# Patient Record
Sex: Female | Born: 1985
Health system: Southern US, Community
[De-identification: ages and names within clinical notes are randomized; demographics above are authoritative.]

## PROBLEM LIST (undated history)

## (undated) DIAGNOSIS — F319 Bipolar disorder, unspecified: Secondary | ICD-10-CM

## (undated) DIAGNOSIS — T1491XA Suicide attempt, initial encounter: Secondary | ICD-10-CM

## (undated) DIAGNOSIS — F32A Depression, unspecified: Secondary | ICD-10-CM

## (undated) DIAGNOSIS — IMO0001 Reserved for inherently not codable concepts without codable children: Secondary | ICD-10-CM

## (undated) DIAGNOSIS — F329 Major depressive disorder, single episode, unspecified: Secondary | ICD-10-CM

## (undated) DIAGNOSIS — N390 Urinary tract infection, site not specified: Secondary | ICD-10-CM

## (undated) DIAGNOSIS — G4733 Obstructive sleep apnea (adult) (pediatric): Principal | ICD-10-CM

## (undated) HISTORY — DX: Obstructive sleep apnea (adult) (pediatric): G47.33

## (undated) HISTORY — PX: NOSE SURGERY: SHX723

---

## 2011-06-27 ENCOUNTER — Emergency Department (HOSPITAL_COMMUNITY): Payer: Medicare Other

## 2011-06-27 ENCOUNTER — Inpatient Hospital Stay (HOSPITAL_COMMUNITY): Payer: Medicare Other

## 2011-06-27 ENCOUNTER — Encounter (HOSPITAL_COMMUNITY): Admission: EM | Disposition: A | Discharge status: Rehabilitation Facility | Payer: Self-pay | Source: Ambulatory Visit

## 2011-06-27 ENCOUNTER — Emergency Department (HOSPITAL_COMMUNITY): Payer: Medicare Other | Admitting: Anesthesiology

## 2011-06-27 ENCOUNTER — Encounter (HOSPITAL_COMMUNITY): Payer: Self-pay | Admitting: Anesthesiology

## 2011-06-27 ENCOUNTER — Encounter (HOSPITAL_COMMUNITY): Payer: Self-pay | Admitting: Radiology

## 2011-06-27 ENCOUNTER — Inpatient Hospital Stay (HOSPITAL_COMMUNITY)
Admission: EM | Admit: 2011-06-27 | Discharge: 2011-07-19 | DRG: 907 | Disposition: A | Discharge status: Rehabilitation Facility | Payer: Medicare Other | Source: Ambulatory Visit | Attending: General Surgery | Admitting: General Surgery

## 2011-06-27 DIAGNOSIS — S41111A Laceration without foreign body of right upper arm, initial encounter: Secondary | ICD-10-CM | POA: Diagnosis present

## 2011-06-27 DIAGNOSIS — J9600 Acute respiratory failure, unspecified whether with hypoxia or hypercapnia: Secondary | ICD-10-CM

## 2011-06-27 DIAGNOSIS — F329 Major depressive disorder, single episode, unspecified: Secondary | ICD-10-CM | POA: Diagnosis present

## 2011-06-27 DIAGNOSIS — T794XXA Traumatic shock, initial encounter: Secondary | ICD-10-CM

## 2011-06-27 DIAGNOSIS — IMO0002 Reserved for concepts with insufficient information to code with codable children: Principal | ICD-10-CM | POA: Diagnosis present

## 2011-06-27 DIAGNOSIS — S82209A Unspecified fracture of shaft of unspecified tibia, initial encounter for closed fracture: Secondary | ICD-10-CM | POA: Diagnosis present

## 2011-06-27 DIAGNOSIS — K439 Ventral hernia without obstruction or gangrene: Secondary | ICD-10-CM | POA: Diagnosis present

## 2011-06-27 DIAGNOSIS — S61409A Unspecified open wound of unspecified hand, initial encounter: Secondary | ICD-10-CM | POA: Diagnosis present

## 2011-06-27 DIAGNOSIS — S82201B Unspecified fracture of shaft of right tibia, initial encounter for open fracture type I or II: Secondary | ICD-10-CM | POA: Diagnosis present

## 2011-06-27 DIAGNOSIS — J95821 Acute postprocedural respiratory failure: Secondary | ICD-10-CM | POA: Diagnosis present

## 2011-06-27 DIAGNOSIS — F319 Bipolar disorder, unspecified: Secondary | ICD-10-CM | POA: Diagnosis present

## 2011-06-27 DIAGNOSIS — T07XXXA Unspecified multiple injuries, initial encounter: Secondary | ICD-10-CM

## 2011-06-27 DIAGNOSIS — S069X9A Unspecified intracranial injury with loss of consciousness of unspecified duration, initial encounter: Secondary | ICD-10-CM

## 2011-06-27 DIAGNOSIS — S51809A Unspecified open wound of unspecified forearm, initial encounter: Secondary | ICD-10-CM | POA: Diagnosis present

## 2011-06-27 DIAGNOSIS — I498 Other specified cardiac arrhythmias: Secondary | ICD-10-CM | POA: Diagnosis not present

## 2011-06-27 DIAGNOSIS — S88119A Complete traumatic amputation at level between knee and ankle, unspecified lower leg, initial encounter: Secondary | ICD-10-CM

## 2011-06-27 DIAGNOSIS — G547 Phantom limb syndrome without pain: Secondary | ICD-10-CM | POA: Diagnosis not present

## 2011-06-27 DIAGNOSIS — X818XXA Intentional self-harm by jumping or lying in front of other moving object, initial encounter: Secondary | ICD-10-CM | POA: Diagnosis present

## 2011-06-27 DIAGNOSIS — S56909A Unspecified injury of unspecified muscles, fascia and tendons at forearm level, unspecified arm, initial encounter: Secondary | ICD-10-CM | POA: Diagnosis present

## 2011-06-27 DIAGNOSIS — D62 Acute posthemorrhagic anemia: Secondary | ICD-10-CM

## 2011-06-27 DIAGNOSIS — S82409A Unspecified fracture of shaft of unspecified fibula, initial encounter for closed fracture: Secondary | ICD-10-CM

## 2011-06-27 DIAGNOSIS — T1491XA Suicide attempt, initial encounter: Secondary | ICD-10-CM | POA: Diagnosis present

## 2011-06-27 DIAGNOSIS — R578 Other shock: Secondary | ICD-10-CM | POA: Diagnosis present

## 2011-06-27 DIAGNOSIS — R571 Hypovolemic shock: Secondary | ICD-10-CM

## 2011-06-27 DIAGNOSIS — S82202A Unspecified fracture of shaft of left tibia, initial encounter for closed fracture: Secondary | ICD-10-CM | POA: Diagnosis present

## 2011-06-27 DIAGNOSIS — J9819 Other pulmonary collapse: Secondary | ICD-10-CM | POA: Diagnosis not present

## 2011-06-27 DIAGNOSIS — S62339A Displaced fracture of neck of unspecified metacarpal bone, initial encounter for closed fracture: Secondary | ICD-10-CM | POA: Diagnosis present

## 2011-06-27 DIAGNOSIS — S82409B Unspecified fracture of shaft of unspecified fibula, initial encounter for open fracture type I or II: Secondary | ICD-10-CM

## 2011-06-27 DIAGNOSIS — F4310 Post-traumatic stress disorder, unspecified: Secondary | ICD-10-CM | POA: Diagnosis present

## 2011-06-27 DIAGNOSIS — E876 Hypokalemia: Secondary | ICD-10-CM | POA: Diagnosis not present

## 2011-06-27 DIAGNOSIS — S82209B Unspecified fracture of shaft of unspecified tibia, initial encounter for open fracture type I or II: Secondary | ICD-10-CM

## 2011-06-27 HISTORY — PX: AMPUTATION: SHX166

## 2011-06-27 HISTORY — PX: EXTERNAL FIXATION LEG: SHX1549

## 2011-06-27 LAB — POCT I-STAT, CHEM 8
Chloride: 104 meq/L (ref 96–112)
Glucose, Bld: 343 mg/dL — ABNORMAL HIGH (ref 70–99)
HCT: 38 % (ref 36.0–46.0)
Potassium: 3.3 meq/L — ABNORMAL LOW (ref 3.5–5.1)
Sodium: 143 meq/L (ref 135–145)

## 2011-06-27 LAB — POCT I-STAT 3, ART BLOOD GAS (G3+)
Acid-base deficit: 10 mmol/L — ABNORMAL HIGH (ref 0.0–2.0)
Bicarbonate: 16.5 meq/L — ABNORMAL LOW (ref 20.0–24.0)
Bicarbonate: 17.2 meq/L — ABNORMAL LOW (ref 20.0–24.0)
Patient temperature: 99.7
TCO2: 18 mmol/L (ref 0–100)
pH, Arterial: 7.247 — ABNORMAL LOW (ref 7.350–7.400)
pH, Arterial: 7.412 — ABNORMAL HIGH (ref 7.350–7.400)
pO2, Arterial: 137 mmHg — ABNORMAL HIGH (ref 80.0–100.0)

## 2011-06-27 LAB — COMPREHENSIVE METABOLIC PANEL
ALT: 45 U/L — ABNORMAL HIGH (ref 0–35)
ALT: 44 U/L — ABNORMAL HIGH (ref 0–35)
Albumin: 2 g/dL — ABNORMAL LOW (ref 3.5–5.2)
Alkaline Phosphatase: 35 U/L — ABNORMAL LOW (ref 39–117)
Calcium: 8.3 mg/dL — ABNORMAL LOW (ref 8.4–10.5)
Glucose, Bld: 358 mg/dL — ABNORMAL HIGH (ref 70–99)
Potassium: 3.1 mEq/L — ABNORMAL LOW (ref 3.5–5.1)
Sodium: 140 mEq/L (ref 135–145)
Sodium: 142 mEq/L (ref 135–145)
Total Protein: 7 g/dL (ref 6.0–8.3)
Total Protein: 4.1 g/dL — ABNORMAL LOW (ref 6.0–8.3)

## 2011-06-27 LAB — POCT I-STAT 4, (NA,K, GLUC, HGB,HCT)
Glucose, Bld: 276 mg/dL — ABNORMAL HIGH (ref 70–99)
Potassium: 3.5 meq/L (ref 3.5–5.1)

## 2011-06-27 LAB — CBC
HCT: 19.7 % — ABNORMAL LOW (ref 36.0–46.0)
Hemoglobin: 6.9 g/dL — CL (ref 12.0–15.0)
MCHC: 32.4 g/dL (ref 30.0–36.0)
MCHC: 34.9 g/dL (ref 30.0–36.0)
MCV: 82.4 fL (ref 78.0–100.0)
Platelets: 166 10*3/uL (ref 150–400)
RDW: 13.8 % (ref 11.5–15.5)
RDW: 13.9 % (ref 11.5–15.5)
WBC: 8.9 10*3/uL (ref 4.0–10.5)

## 2011-06-27 LAB — GLUCOSE, CAPILLARY
Glucose-Capillary: 137 mg/dL — ABNORMAL HIGH (ref 70–99)
Glucose-Capillary: 93 mg/dL (ref 70–99)
Glucose-Capillary: 79 mg/dL (ref 70–99)
Glucose-Capillary: 88 mg/dL (ref 70–99)

## 2011-06-27 LAB — PREPARE RBC (CROSSMATCH)

## 2011-06-27 LAB — URINALYSIS, ROUTINE W REFLEX MICROSCOPIC
Bilirubin Urine: NEGATIVE
Leukocytes, UA: NEGATIVE
Nitrite: NEGATIVE
Specific Gravity, Urine: 1.033 — ABNORMAL HIGH (ref 1.005–1.030)
pH: 7.5 (ref 5.0–8.0)

## 2011-06-27 LAB — ETHANOL: Alcohol, Ethyl (B): <11 mg/dL (ref 0–11)

## 2011-06-27 LAB — URINE MICROSCOPIC-ADD ON

## 2011-06-27 LAB — POCT PREGNANCY, URINE: Preg Test, Ur: NEGATIVE

## 2011-06-27 SURGERY — AMPUTATION, ABOVE KNEE
Anesthesia: General | Site: Leg Upper | Laterality: Right | Wound class: Contaminated

## 2011-06-27 MED ORDER — SODIUM BICARBONATE 8.4 % IV SOLN
50.0000 meq | Freq: Once | INTRAVENOUS | Status: AC
  Administered 2011-06-27: 50 meq via INTRAVENOUS
  Filled 2011-06-27: qty 50

## 2011-06-27 MED ORDER — MIDAZOLAM HCL 5 MG/5ML IJ SOLN
INTRAMUSCULAR | Status: AC | PRN
  Administered 2011-06-27: 2 mg via INTRAVENOUS

## 2011-06-27 MED ORDER — ACETAMINOPHEN 650 MG RE SUPP
650.0000 mg | Freq: Four times a day (QID) | RECTAL | Status: DC | PRN
  Administered 2011-06-27 – 2011-07-03 (×6): 650 mg via RECTAL
  Filled 2011-06-27 (×6): qty 1

## 2011-06-27 MED ORDER — SODIUM CHLORIDE 0.9 % IV SOLN
2500.0000 ug | INTRAVENOUS | Status: DC | PRN
  Administered 2011-06-27: 250 ug/h via INTRAVENOUS

## 2011-06-27 MED ORDER — MIDAZOLAM HCL 5 MG/ML IJ SOLN
2.0000 mg/h | INTRAMUSCULAR | Status: DC
  Administered 2011-06-27: 2 mg/h via INTRAVENOUS
  Filled 2011-06-27: qty 10

## 2011-06-27 MED ORDER — DEXTROSE 50 % IV SOLN: 25.0000 mL | INTRAVENOUS | Status: DC | PRN

## 2011-06-27 MED ORDER — SODIUM BICARBONATE 8.4 % IV SOLN
INTRAVENOUS | Status: AC
  Filled 2011-06-27: qty 50

## 2011-06-27 MED ORDER — ROCURONIUM BROMIDE 50 MG/5ML IV SOLN
INTRAVENOUS | Status: AC | PRN
  Administered 2011-06-27: 100 mg via INTRAVENOUS

## 2011-06-27 MED ORDER — GENTAMICIN SULFATE 40 MG/ML IJ SOLN
530.0000 mg | INTRAVENOUS | Status: DC
  Administered 2011-06-27 – 2011-07-03 (×7): 530 mg via INTRAVENOUS
  Filled 2011-06-27 (×9): qty 13.25

## 2011-06-27 MED ORDER — HETASTARCH-ELECTROLYTES 6 % IV SOLN: 500.0000 mL | Freq: Once | INTRAVENOUS | Status: AC

## 2011-06-27 MED ORDER — POTASSIUM CHLORIDE 10 MEQ/100ML IV SOLN
10.0000 meq | INTRAVENOUS | Status: AC
Start: 2011-06-27 — End: 2011-06-27
  Administered 2011-06-27 (×3): 10 meq via INTRAVENOUS
  Filled 2011-06-27 (×3): qty 100

## 2011-06-27 MED ORDER — ONDANSETRON HCL 4 MG/2ML IJ SOLN: 4.0000 mg | Freq: Four times a day (QID) | INTRAMUSCULAR | Status: DC | PRN

## 2011-06-27 MED ORDER — ETOMIDATE 2 MG/ML IV SOLN
INTRAVENOUS | Status: AC
  Filled 2011-06-27: qty 20

## 2011-06-27 MED ORDER — CHLORHEXIDINE GLUCONATE 0.12 % MT SOLN
OROMUCOSAL | Status: AC
  Administered 2011-06-27: 15 mL
  Filled 2011-06-27: qty 15

## 2011-06-27 MED ORDER — INSULIN REGULAR BOLUS VIA INFUSION
0.0000 [IU] | Freq: Three times a day (TID) | INTRAVENOUS | Status: DC
  Filled 2011-06-27: qty 10

## 2011-06-27 MED ORDER — PANTOPRAZOLE SODIUM 40 MG IV SOLR
40.0000 mg | Freq: Every day | INTRAVENOUS | Status: DC
  Administered 2011-06-27 – 2011-07-11 (×15): 40 mg via INTRAVENOUS
  Filled 2011-06-27 (×17): qty 40

## 2011-06-27 MED ORDER — ONDANSETRON HCL 4 MG PO TABS: 4.0000 mg | ORAL_TABLET | Freq: Four times a day (QID) | ORAL | Status: DC | PRN

## 2011-06-27 MED ORDER — ETOMIDATE 2 MG/ML IV SOLN
INTRAVENOUS | Status: AC | PRN
  Administered 2011-06-27: 20 mg via INTRAVENOUS

## 2011-06-27 MED ORDER — IOHEXOL 300 MG/ML  SOLN
100.0000 mL | Freq: Once | INTRAMUSCULAR | Status: AC | PRN
  Administered 2011-06-27: 100 mL via INTRAVENOUS

## 2011-06-27 MED ORDER — MIDAZOLAM BOLUS VIA INFUSION
1.0000 mg | INTRAVENOUS | Status: DC | PRN
  Filled 2011-06-27: qty 2

## 2011-06-27 MED ORDER — LIDOCAINE HCL (CARDIAC) 20 MG/ML IV SOLN
INTRAVENOUS | Status: AC
  Filled 2011-06-27: qty 5

## 2011-06-27 MED ORDER — SODIUM CHLORIDE 0.9 % IV SOLN
50.0000 ug/h | INTRAVENOUS | Status: DC
  Administered 2011-06-27: 50 ug/h via INTRAVENOUS
  Filled 2011-06-27: qty 50

## 2011-06-27 MED ORDER — SODIUM CHLORIDE 0.9 % IV BOLUS (SEPSIS)
1000.0000 mL | Freq: Once | INTRAVENOUS | Status: AC
  Administered 2011-06-27: 1000 mL via INTRAVENOUS

## 2011-06-27 MED ORDER — PHENYLEPHRINE HCL 10 MG/ML IJ SOLN
10.0000 mg | INTRAVENOUS | Status: DC | PRN
  Administered 2011-06-27: 40 ug/min via INTRAVENOUS

## 2011-06-27 MED ORDER — SODIUM CHLORIDE 0.9 % IR SOLN
Status: DC | PRN
  Administered 2011-06-27: 3000 mL

## 2011-06-27 MED ORDER — FENTANYL BOLUS VIA INFUSION
50.0000 ug | Freq: Four times a day (QID) | INTRAVENOUS | Status: DC | PRN
  Filled 2011-06-27: qty 100

## 2011-06-27 MED ORDER — SODIUM CHLORIDE 0.9 % IV SOLN
INTRAVENOUS | Status: DC
  Filled 2011-06-27: qty 1

## 2011-06-27 MED ORDER — SODIUM CHLORIDE 0.9 % IV SOLN
50.0000 mg | INTRAVENOUS | Status: DC | PRN
  Administered 2011-06-27: 5 mg/h via INTRAVENOUS

## 2011-06-27 MED ORDER — CEFAZOLIN SODIUM 1-5 GM-% IV SOLN
1.0000 g | Freq: Three times a day (TID) | INTRAVENOUS | Status: DC
  Administered 2011-06-27 – 2011-07-04 (×21): 1 g via INTRAVENOUS
  Filled 2011-06-27 (×24): qty 50

## 2011-06-27 MED ORDER — FENTANYL CITRATE 0.05 MG/ML IJ SOLN
INTRAMUSCULAR | Status: AC
  Filled 2011-06-27: qty 2

## 2011-06-27 MED ORDER — SODIUM CHLORIDE 0.9 % IV SOLN
INTRAVENOUS | Status: DC | PRN
  Administered 2011-06-27 (×2): via INTRAVENOUS

## 2011-06-27 MED ORDER — PHENYLEPHRINE HCL 10 MG/ML IJ SOLN
0.0000 ug/min | INTRAVENOUS | Status: DC
  Filled 2011-06-27 (×2): qty 4

## 2011-06-27 MED ORDER — LACTATED RINGERS IV SOLN
INTRAVENOUS | Status: DC | PRN
  Administered 2011-06-27: 04:00:00 via INTRAVENOUS

## 2011-06-27 MED ORDER — SUCCINYLCHOLINE CHLORIDE 20 MG/ML IJ SOLN
INTRAMUSCULAR | Status: AC
  Filled 2011-06-27: qty 10

## 2011-06-27 MED ORDER — SODIUM CHLORIDE 0.9 % IV SOLN
50.0000 ug/h | INTRAVENOUS | Status: DC
  Administered 2011-06-27: 300 ug/h via INTRAVENOUS
  Administered 2011-06-28 – 2011-07-01 (×2): 200 ug/h via INTRAVENOUS
  Administered 2011-07-01: 100 ug/h via INTRAVENOUS
  Administered 2011-07-02 – 2011-07-03 (×3): 200 ug/h via INTRAVENOUS
  Administered 2011-07-03: 150 ug/h via INTRAVENOUS
  Administered 2011-07-04: 350 ug/h via INTRAVENOUS
  Administered 2011-07-04: 200 ug/h via INTRAVENOUS
  Administered 2011-07-04: 300 ug/h via INTRAVENOUS
  Administered 2011-07-05 – 2011-07-06 (×3): 350 ug/h via INTRAVENOUS
  Administered 2011-07-06: 300 ug/h via INTRAVENOUS
  Administered 2011-07-06 – 2011-07-08 (×3): 250 ug/h via INTRAVENOUS
  Administered 2011-07-08 – 2011-07-10 (×5): 300 ug/h via INTRAVENOUS
  Filled 2011-06-27 (×31): qty 50

## 2011-06-27 MED ORDER — SODIUM CHLORIDE 0.9 % IV SOLN: INTRAVENOUS | Status: DC

## 2011-06-27 MED ORDER — FENTANYL BOLUS VIA INFUSION
50.0000 ug | Freq: Four times a day (QID) | INTRAVENOUS | Status: DC | PRN
  Administered 2011-07-04 (×2): 50 ug via INTRAVENOUS
  Administered 2011-07-07: 100 ug via INTRAVENOUS
  Filled 2011-06-27 (×4): qty 100

## 2011-06-27 MED ORDER — PANTOPRAZOLE SODIUM 40 MG PO TBEC: 40.0000 mg | DELAYED_RELEASE_TABLET | Freq: Every day | ORAL | Status: DC

## 2011-06-27 MED ORDER — INSULIN ASPART 100 UNIT/ML ~~LOC~~ SOLN
0.0000 [IU] | SUBCUTANEOUS | Status: DC
  Administered 2011-06-30: 2 [IU] via SUBCUTANEOUS
  Administered 2011-06-30: 3 [IU] via SUBCUTANEOUS
  Administered 2011-06-30 (×2): 2 [IU] via SUBCUTANEOUS
  Administered 2011-07-01: 3 [IU] via SUBCUTANEOUS
  Administered 2011-07-01: 2 [IU] via SUBCUTANEOUS
  Administered 2011-07-01 (×2): 3 [IU] via SUBCUTANEOUS
  Administered 2011-07-01 (×2): 2 [IU] via SUBCUTANEOUS
  Administered 2011-07-01 – 2011-07-03 (×9): 3 [IU] via SUBCUTANEOUS
  Administered 2011-07-03 – 2011-07-04 (×2): 2 [IU] via SUBCUTANEOUS
  Administered 2011-07-04: 4 [IU] via SUBCUTANEOUS
  Administered 2011-07-04 (×2): 2 [IU] via SUBCUTANEOUS
  Administered 2011-07-04 – 2011-07-16 (×34): 3 [IU] via SUBCUTANEOUS

## 2011-06-27 MED ORDER — MIDAZOLAM HCL 5 MG/ML IJ SOLN
2.0000 mg/h | INTRAMUSCULAR | Status: DC
  Administered 2011-06-27: 5 mg/h via INTRAVENOUS
  Administered 2011-06-28: 3 mg/h via INTRAVENOUS
  Administered 2011-06-30: 1 mg/h via INTRAVENOUS
  Administered 2011-07-01 – 2011-07-02 (×2): 4 mg/h via INTRAVENOUS
  Administered 2011-07-02: 1 mg/h via INTRAVENOUS
  Administered 2011-07-03: 4 mg/h via INTRAVENOUS
  Filled 2011-06-27 (×14): qty 10

## 2011-06-27 MED ORDER — LORAZEPAM 2 MG/ML IJ SOLN
INTRAMUSCULAR | Status: AC
  Administered 2011-06-27: via INTRAMUSCULAR
  Filled 2011-06-27: qty 1

## 2011-06-27 MED ORDER — DEXTROSE-NACL 5-0.45 % IV SOLN: INTRAVENOUS | Status: DC

## 2011-06-27 MED ORDER — SODIUM CHLORIDE 0.9 % IV SOLN
100.0000 [IU] | INTRAVENOUS | Status: DC | PRN
  Administered 2011-06-27: 6.5 [IU]/h via INTRAVENOUS

## 2011-06-27 MED ORDER — MIDAZOLAM HCL 2 MG/2ML IJ SOLN
INTRAMUSCULAR | Status: AC
  Filled 2011-06-27: qty 2

## 2011-06-27 MED ORDER — HETASTARCH-ELECTROLYTES 6 % IV SOLN
500.0000 mL | Freq: Once | INTRAVENOUS | Status: AC
  Administered 2011-06-27: 500 mL via INTRAVENOUS

## 2011-06-27 MED ORDER — CHLORHEXIDINE GLUCONATE 0.12 % MT SOLN
OROMUCOSAL | Status: AC
  Filled 2011-06-27: qty 15

## 2011-06-27 MED ORDER — FENTANYL CITRATE 0.05 MG/ML IJ SOLN
INTRAMUSCULAR | Status: AC | PRN
  Administered 2011-06-27: 100 ug via INTRAVENOUS

## 2011-06-27 MED ORDER — POTASSIUM CHLORIDE IN NACL 20-0.9 MEQ/L-% IV SOLN
INTRAVENOUS | Status: DC
  Administered 2011-06-27: 09:00:00 via INTRAVENOUS
  Administered 2011-06-28: 125 mL/h via INTRAVENOUS
  Filled 2011-06-27 (×6): qty 1000

## 2011-06-27 MED ORDER — ROCURONIUM BROMIDE 50 MG/5ML IV SOLN
INTRAVENOUS | Status: AC
  Filled 2011-06-27: qty 2

## 2011-06-27 MED ORDER — SUCCINYLCHOLINE CHLORIDE 20 MG/ML IJ SOLN
INTRAMUSCULAR | Status: AC | PRN
Start: 2011-06-27 — End: 2011-06-27
  Administered 2011-06-27: 100 mg via INTRAVENOUS

## 2011-06-27 MED ORDER — HETASTARCH-ELECTROLYTES 6 % IV SOLN
INTRAVENOUS | Status: AC
  Administered 2011-06-27: 500 mL
  Filled 2011-06-27: qty 1000

## 2011-06-27 MED ORDER — CEFAZOLIN SODIUM 1-5 GM-% IV SOLN
INTRAVENOUS | Status: DC | PRN
  Administered 2011-06-27: 2 g via INTRAVENOUS

## 2011-06-27 MED ORDER — POTASSIUM CHLORIDE IN NACL 20-0.9 MEQ/L-% IV SOLN: INTRAVENOUS | Status: DC

## 2011-06-27 MED ORDER — MIDAZOLAM BOLUS VIA INFUSION
1.0000 mg | INTRAVENOUS | Status: DC | PRN
  Administered 2011-07-07: 2 mg via INTRAVENOUS
  Filled 2011-06-27 (×3): qty 2

## 2011-06-27 SURGICAL SUPPLY — 82 items
250MM CARBON RODS ×6 IMPLANT
BANDAGE ELASTIC 4 VELCRO ST LF (GAUZE/BANDAGES/DRESSINGS) ×3 IMPLANT
BANDAGE ELASTIC 6 VELCRO ST LF (GAUZE/BANDAGES/DRESSINGS) ×3 IMPLANT
BANDAGE ESMARK 6X9 LF (GAUZE/BANDAGES/DRESSINGS) ×2 IMPLANT
BANDAGE GAUZE ELAST BULKY 4 IN (GAUZE/BANDAGES/DRESSINGS) ×6 IMPLANT
BLADE SURG 10 STRL SS (BLADE) ×3 IMPLANT
BNDG COHESIVE 4X5 TAN STRL (GAUZE/BANDAGES/DRESSINGS) ×3 IMPLANT
BNDG COHESIVE 6X5 TAN STRL LF (GAUZE/BANDAGES/DRESSINGS) ×3 IMPLANT
BNDG ESMARK 6X9 LF (GAUZE/BANDAGES/DRESSINGS) ×3
BRUSH SCRUB DISP (MISCELLANEOUS) ×6 IMPLANT
CLEANER TIP ELECTROSURG 2X2 (MISCELLANEOUS) ×3 IMPLANT
CLOTH BEACON ORANGE TIMEOUT ST (SAFETY) ×3 IMPLANT
COVER MAYO STAND STRL (DRAPES) IMPLANT
COVER SURGICAL LIGHT HANDLE (MISCELLANEOUS) ×6 IMPLANT
CUFF TOURNIQUET SINGLE 18IN (TOURNIQUET CUFF) IMPLANT
CUFF TOURNIQUET SINGLE 24IN (TOURNIQUET CUFF) IMPLANT
CUFF TOURNIQUET SINGLE 34IN LL (TOURNIQUET CUFF) IMPLANT
CUFF TOURNIQUET SINGLE 44IN (TOURNIQUET CUFF) IMPLANT
DRAIN PENROSE 1/2X12 LTX STRL (WOUND CARE) IMPLANT
DRAPE C-ARM 42X72 X-RAY (DRAPES) ×3 IMPLANT
DRAPE C-ARMOR (DRAPES) ×3 IMPLANT
DRAPE EXTREMITY BILATERAL (DRAPE) ×3 IMPLANT
DRAPE EXTREMITY T 121X128X90 (DRAPE) IMPLANT
DRAPE PROXIMA HALF (DRAPES) ×15 IMPLANT
DRAPE U-SHAPE 47X51 STRL (DRAPES) ×3 IMPLANT
DRSG ADAPTIC 3X8 NADH LF (GAUZE/BANDAGES/DRESSINGS) ×3 IMPLANT
DRSG VAC ATS LRG SENSATRAC (GAUZE/BANDAGES/DRESSINGS) ×3 IMPLANT
ELECT REM PT RETURN 9FT ADLT (ELECTROSURGICAL) ×3
ELECTRODE REM PT RTRN 9FT ADLT (ELECTROSURGICAL) ×2 IMPLANT
EVACUATOR 1/8 PVC DRAIN (DRAIN) IMPLANT
GLOVE BIO SURGEON STRL SZ7.5 (GLOVE) ×3 IMPLANT
GLOVE BIO SURGEON STRL SZ8 (GLOVE) ×3 IMPLANT
GLOVE BIOGEL PI IND STRL 7.5 (GLOVE) ×2 IMPLANT
GLOVE BIOGEL PI IND STRL 8 (GLOVE) ×2 IMPLANT
GLOVE BIOGEL PI INDICATOR 7.5 (GLOVE) ×1
GLOVE BIOGEL PI INDICATOR 8 (GLOVE) ×1
GOWN PREVENTION PLUS XLARGE (GOWN DISPOSABLE) ×3 IMPLANT
GOWN STRL NON-REIN LRG LVL3 (GOWN DISPOSABLE) ×6 IMPLANT
HALF PIN 5.0X160 (PIN) ×12 IMPLANT
HANDPIECE INTERPULSE COAX TIP (DISPOSABLE) ×1
KIT BASIN OR (CUSTOM PROCEDURE TRAY) ×3 IMPLANT
KIT ROOM TURNOVER OR (KITS) ×3 IMPLANT
LARGE VAC SPONGE ×3 IMPLANT
MANIFOLD NEPTUNE II (INSTRUMENTS) ×3 IMPLANT
NEEDLE 22X1 1/2 (OR ONLY) (NEEDLE) IMPLANT
NS IRRIG 1000ML POUR BTL (IV SOLUTION) ×3 IMPLANT
PACK GENERAL/GYN (CUSTOM PROCEDURE TRAY) ×3 IMPLANT
PACK ORTHO EXTREMITY (CUSTOM PROCEDURE TRAY) ×3 IMPLANT
PAD ARMBOARD 7.5X6 YLW CONV (MISCELLANEOUS) ×6 IMPLANT
PAD CAST 3X4 CTTN HI CHSV (CAST SUPPLIES) ×6 IMPLANT
PADDING CAST COTTON 3X4 STRL (CAST SUPPLIES) ×3
PADDING CAST COTTON 6X4 STRL (CAST SUPPLIES) ×9 IMPLANT
PIN CLAMP 2BAR 75MM BLUE (PIN) ×6 IMPLANT
SET HNDPC FAN SPRY TIP SCT (DISPOSABLE) ×2 IMPLANT
SPONGE GAUZE 4X4 12PLY (GAUZE/BANDAGES/DRESSINGS) ×3 IMPLANT
SPONGE LAP 18X18 X RAY DECT (DISPOSABLE) ×6 IMPLANT
SPONGE SCRUB IODOPHOR (GAUZE/BANDAGES/DRESSINGS) ×3 IMPLANT
STAPLER VISISTAT 35W (STAPLE) IMPLANT
STOCKINETTE IMPERVIOUS LG (DRAPES) ×3 IMPLANT
STRIP CLOSURE SKIN 1/2X4 (GAUZE/BANDAGES/DRESSINGS) IMPLANT
SUCTION FRAZIER TIP 10 FR DISP (SUCTIONS) IMPLANT
SUT ETHILON 2 0 PSLX (SUTURE) IMPLANT
SUT ETHILON 3 0 PS 1 (SUTURE) IMPLANT
SUT PDS AB 1 CTX 36 (SUTURE) ×6 IMPLANT
SUT PDS AB 2-0 CT1 27 (SUTURE) IMPLANT
SUT SILK 2 0 (SUTURE)
SUT SILK 2 0 SH CR/8 (SUTURE) ×3 IMPLANT
SUT SILK 2-0 18XBRD TIE 12 (SUTURE) IMPLANT
SUT VIC AB 0 CT1 27 (SUTURE) ×2
SUT VIC AB 0 CT1 27XBRD ANBCTR (SUTURE) ×4 IMPLANT
SUT VIC AB 2-0 CT1 27 (SUTURE) ×2
SUT VIC AB 2-0 CT1 TAPERPNT 27 (SUTURE) ×4 IMPLANT
SWAB COLLECTION DEVICE MRSA (MISCELLANEOUS) IMPLANT
SYR CONTROL 10ML LL (SYRINGE) IMPLANT
TOWEL OR 17X24 6PK STRL BLUE (TOWEL DISPOSABLE) ×6 IMPLANT
TOWEL OR 17X26 10 PK STRL BLUE (TOWEL DISPOSABLE) ×6 IMPLANT
TUBE ANAEROBIC SPECIMEN COL (MISCELLANEOUS) IMPLANT
TUBE CONNECTING 12X1/4 (SUCTIONS) ×3 IMPLANT
UNDERPAD 30X30 INCONTINENT (UNDERPADS AND DIAPERS) ×3 IMPLANT
VAC CANISTER ×3 IMPLANT
WATER STERILE IRR 1000ML POUR (IV SOLUTION) ×6 IMPLANT
YANKAUER SUCT BULB TIP NO VENT (SUCTIONS) ×3 IMPLANT

## 2011-06-27 NOTE — Progress Notes (Signed)
Dr. Corliss Skains notified of arrival ABG and low blood pressure.  Orders rec'd to change TV from 500 to 550, rate increased from 14 to 22.  Orders rec'd for 1000cc NS bolus over 1 hour and neosynephrine if needed.  Will continue to monitor.

## 2011-06-27 NOTE — ED Notes (Signed)
Pt successfully intubated, x-ray at bedside for confirmation, respiratory therapist setting up ventilator.
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
## 2011-06-27 NOTE — Consult Note (Signed)
Made aware of patient per Trauma team, however patient is currently intubated and unable to assess.  Will follow up when patient care participate.  Will follow up.  Ashley Jacobs, MSW LCSW [phone]

## 2011-06-27 NOTE — Consult Note (Signed)
ANTIBIOTIC CONSULT NOTE - INITIAL  Pharmacy Consult for Gentamicin Indication: prophylaxis  Not on File  Patient Measurements: Height: 5\' 3"  (160 cm) Weight: 242 lb 8.1 oz (110 kg) IBW/kg (Calculated) : 52.4  Adjusted Body Weight: 75.44kg  Vital Signs: Temp: 99.7 F (37.6 C) (05/28 1128) Temp src: Axillary (05/28 1128) BP: 129/77 mmHg (05/28 1137) Pulse Rate: 126  (05/28 1137) Intake/Output from previous day: 05/27 0701 - 05/28 0700 In: 9055 [I.V.:8705; Blood:350] Out: 190 [Urine:190] Intake/Output from this shift: Total I/O In: 1756.7 [I.V.:526.7; NG/GT:30; IV Piggyback:1200] Out: 360 [Urine:160; Emesis/NG output:200]  Labs:  Basename 06/27/11 0435 06/27/11 0310 06/27/11 0037 06/27/11 0034  WBC 21.0* -- -- 13.4*  HGB 9.9* 10.5* 12.9 --  PLT 166 -- -- 264  LABCREA -- -- -- --  CREATININE 1.24* -- 1.50* 1.32*   Estimated Creatinine Clearance: 82.6 ml/min (by C-G formula based on Cr of 1.24).  Microbiology: Recent Results (from the past 720 hour(s))  MRSA PCR SCREENING     Status: Normal   Collection Time   06/27/11  4:45 AM      Component Value Range Status Comment   MRSA by PCR NEGATIVE  NEGATIVE  Final     Medical History: History reviewed. No pertinent past medical history.  Assessment: 25yo obese female with an open left tib-fib fracture to begin gentamicin for prophylaxis.  Serum creatnine is slightly elevated at 1.24 with CrCl 17ml/min.  Goal of Therapy:  Gentamicin trough <2  Plan:  1) Gentamicin 530mg  IV q24 (~7mg /kg) 2) Obtain 10h gentamicin trough level 3) Use hartford nomogram to adjust dose if needed  Fredrik Rigger 06/27/2011,11:50 AM
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
## 2011-06-27 NOTE — ED Notes (Signed)
Difficulty obtaining manual blood pressure at this time due to pt being hysterical upon arrival, then immediate intubation.  Can only use left arm because of significant damage to right arm.  EMT attempting BP at this time.
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
## 2011-06-27 NOTE — Anesthesia Preprocedure Evaluation (Signed)
Anesthesia Evaluation  Patient identified by MRN, date of birth, ID band Patient unresponsive    Reviewed: Allergy & Precautions, H&P , Patient's Chart, lab work & pertinent test results  Airway       Dental   Pulmonary  breath sounds clear to auscultation        Cardiovascular Rhythm:Regular Rate:Tachycardia     Neuro/Psych    GI/Hepatic   Endo/Other  Morbid obesity  Renal/GU Renal diseasenegative Renal ROS     Musculoskeletal   Abdominal   Peds  Hematology   Anesthesia Other Findings Pt intubated prior to arrival.  Unable to examine. Sedated.  Reproductive/Obstetrics                           Anesthesia Physical Anesthesia Plan  ASA: IV  Anesthesia Plan: General   Post-op Pain Management:    Induction: Intravenous  Airway Management Planned: Oral ETT  Additional Equipment:   Intra-op Plan:   Post-operative Plan: Post-operative intubation/ventilation  Informed Consent: I have reviewed the patients History and Physical, chart, labs and discussed the procedure including the risks, benefits and alternatives for the proposed anesthesia with the patient or authorized representative who has indicated his/her understanding and acceptance.     Plan Discussed with: Anesthesiologist, Surgeon and CRNA  Anesthesia Plan Comments:         Anesthesia Quick Evaluation

## 2011-06-27 NOTE — Progress Notes (Signed)
Patient ID: Tonya [name], female   DOB: Aug 22, 1985, 26 y.o.   MRN: [id number] Second unit of PRBC going in.  Hemodynamics improved.  Mother reportedly en route from [place]. [name], MD, MPH, FACS Pager: [phone]

## 2011-06-27 NOTE — Anesthesia Postprocedure Evaluation (Signed)
  Anesthesia Post-op Note  Patient: Tonya [name]  Procedure(s) Performed: Procedure(s) (LRB): AMPUTATION ABOVE KNEE (Right) EXTERNAL FIXATION LEG (Left)  Patient Location: SICU  Anesthesia Type: General  Level of Consciousness: sedated and Patient remains intubated per anesthesia plan  Airway and Oxygen Therapy: Patient remains intubated per anesthesia plan and Patient placed on Ventilator (see vital sign flow sheet for setting)  Post-op Pain: none  Post-op Assessment: Post-op Vital signs reviewed  Post-op Vital Signs: Reviewed  Complications: No apparent anesthesia complications

## 2011-06-27 NOTE — Progress Notes (Signed)
Chaplain responded to trauma page. Patient intubated. No family present. Chaplain will monitor the patient's progress and follow up as needed.

## 2011-06-27 NOTE — Progress Notes (Signed)
UR completed.   Number for CSW at [place], [place] facility given to psych CSW here to call and get update on psych hx and meds. [phone].

## 2011-06-27 NOTE — Progress Notes (Signed)
Pt's friend able to make contact with pt's mother in [place] . Mother and pt's brother are preparing to come to hospital today
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
## 2011-06-27 NOTE — Progress Notes (Signed)
Vt increased per MD order

## 2011-06-27 NOTE — Procedures (Signed)
Central Venous Catheter Insertion Procedure Note Chandria Arwen Haseley [id number] 08/24/1985  Procedure: Insertion of Central Venous Catheter Indications: Assessment of intravascular volume  Procedure Details Consent: Unable to obtain consent because of mother out of state and cannot reach by phone. Time Out: Verified patient identification, verified procedure, site/side was marked, verified correct patient position, special equipment/implants available, medications/allergies/relevent history reviewed, required imaging and test results available.  Performed  Maximum sterile technique was used including antiseptics, cap, gloves, gown, hand hygiene, mask and sheet. Skin prep: Chlorhexidine; local anesthetic administered A antimicrobial bonded/coated triple lumen catheter was placed in the left subclavian vein using the Seldinger technique.  Evaluation Blood flow good Complications: No apparent complications Patient did tolerate procedure well. Chest X-ray ordered to verify placement.  CXR: pending.  Mateen Franssen E 06/27/2011, 10:34 AM

## 2011-06-27 NOTE — ED Notes (Signed)
Chem 8 results given to Trauma MD by B. Bing Plume, EMT
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
## 2011-06-27 NOTE — ED Notes (Signed)
PT arrived in CT scan

## 2011-06-27 NOTE — Progress Notes (Signed)
Pt's family requested chaplain assistance from waiting room.  Pt's mother is traveling from out of town and will need a place to stay tonight.  I assisted them in finding the [facility] office to see if they could use the [facility].  I offered emotional support to family as they told me about the pt's situation.  I will refer to unit chaplain for follow-up Dellie Catholic  [phone] oncall pager.

## 2011-06-27 NOTE — Transfer of Care (Signed)
Immediate Anesthesia Transfer of Care Note  Patient: Tonya [name]  Procedure(s) Performed: Procedure(s) (LRB): AMPUTATION ABOVE KNEE (Right) EXTERNAL FIXATION LEG (Left)  Patient Location: SICU  Anesthesia Type: General  Level of Consciousness: unresponsive  Airway & Oxygen Therapy: Patient remains intubated per anesthesia plan and Patient placed on Ventilator (see vital sign flow sheet for setting)  Post-op Assessment: Report given to PACU RN  Post vital signs: Reviewed and stable  Complications: No apparent anesthesia complications

## 2011-06-28 ENCOUNTER — Inpatient Hospital Stay (HOSPITAL_COMMUNITY): Payer: Medicare Other

## 2011-06-28 DIAGNOSIS — Z9911 Dependence on respirator [ventilator] status: Secondary | ICD-10-CM

## 2011-06-28 LAB — GLUCOSE, CAPILLARY
Glucose-Capillary: 90 mg/dL (ref 70–99)
Glucose-Capillary: 80 mg/dL (ref 70–99)
Glucose-Capillary: 89 mg/dL (ref 70–99)
Glucose-Capillary: 99 mg/dL (ref 70–99)
Glucose-Capillary: 117 mg/dL — ABNORMAL HIGH (ref 70–99)

## 2011-06-28 LAB — CBC
HCT: 26.8 % — ABNORMAL LOW (ref 36.0–46.0)
Hemoglobin: 9.6 g/dL — ABNORMAL LOW (ref 12.0–15.0)
MCH: 29.7 pg (ref 26.0–34.0)
MCHC: 35.8 g/dL (ref 30.0–36.0)
MCV: 83 fL (ref 78.0–100.0)
RBC: 3.23 MIL/uL — ABNORMAL LOW (ref 3.87–5.11)

## 2011-06-28 LAB — BASIC METABOLIC PANEL
BUN: 15 mg/dL (ref 6–23)
CO2: 18 mEq/L — ABNORMAL LOW (ref 19–32)
Calcium: 6.5 mg/dL — ABNORMAL LOW (ref 8.4–10.5)
Creatinine, Ser: 1.17 mg/dL — ABNORMAL HIGH (ref 0.50–1.10)
GFR calc non Af Amer: 64 mL/min — ABNORMAL LOW (ref >90)
Glucose, Bld: 105 mg/dL — ABNORMAL HIGH (ref 70–99)

## 2011-06-28 LAB — POCT I-STAT 3, ART BLOOD GAS (G3+)
Patient temperature: 99.8
TCO2: 18 mmol/L (ref 0–100)
pCO2 arterial: 33.5 mmHg — ABNORMAL LOW (ref 35.0–45.0)
pH, Arterial: 7.313 — ABNORMAL LOW (ref 7.350–7.400)

## 2011-06-28 LAB — GENTAMICIN LEVEL, TROUGH: Gentamicin Trough: 4.2 ug/mL (ref 0.5–2.0)

## 2011-06-28 LAB — HEMOGLOBIN A1C: Mean Plasma Glucose: 114 mg/dL (ref <117)

## 2011-06-28 MED ORDER — HETASTARCH-ELECTROLYTES 6 % IV SOLN
500.0000 mL | Freq: Once | INTRAVENOUS | Status: AC
  Administered 2011-06-28: 500 mL via INTRAVENOUS
  Filled 2011-06-28: qty 500

## 2011-06-28 MED ORDER — BIOTENE DRY MOUTH MT LIQD
15.0000 mL | Freq: Four times a day (QID) | OROMUCOSAL | Status: DC
  Administered 2011-06-28 – 2011-07-16 (×67): 15 mL via OROMUCOSAL

## 2011-06-28 MED ORDER — FENTANYL CITRATE 0.05 MG/ML IJ SOLN: 50.0000 ug | INTRAMUSCULAR | Status: DC | PRN

## 2011-06-28 MED ORDER — ENOXAPARIN SODIUM 30 MG/0.3ML ~~LOC~~ SOLN
30.0000 mg | Freq: Two times a day (BID) | SUBCUTANEOUS | Status: DC
  Administered 2011-06-28 – 2011-07-19 (×37): 30 mg via SUBCUTANEOUS
  Filled 2011-06-28 (×48): qty 0.3

## 2011-06-28 MED ORDER — HETASTARCH-ELECTROLYTES 6 % IV SOLN
INTRAVENOUS | Status: AC
  Administered 2011-06-28: 500 mL
  Filled 2011-06-28: qty 500

## 2011-06-28 MED ORDER — DARBEPOETIN ALFA-POLYSORBATE 100 MCG/0.5ML IJ SOLN
100.0000 ug | INTRAMUSCULAR | Status: AC
  Administered 2011-06-28 – 2011-07-12 (×3): 100 ug via SUBCUTANEOUS
  Filled 2011-06-28 (×3): qty 0.5

## 2011-06-28 MED ORDER — KCL IN DEXTROSE-NACL 20-5-0.45 MEQ/L-%-% IV SOLN
INTRAVENOUS | Status: DC
  Administered 2011-06-28 – 2011-06-29 (×4): via INTRAVENOUS
  Administered 2011-06-30: 75 mL/h via INTRAVENOUS
  Administered 2011-07-01: 500 mL via INTRAVENOUS
  Administered 2011-07-02: 01:00:00 via INTRAVENOUS
  Administered 2011-07-03: 50 mL/h via INTRAVENOUS
  Administered 2011-07-11 – 2011-07-14 (×3): via INTRAVENOUS
  Administered 2011-07-15: 1000 mL via INTRAVENOUS
  Filled 2011-06-28 (×34): qty 1000

## 2011-06-28 MED ORDER — PIVOT 1.5 CAL PO LIQD
1000.0000 mL | ORAL | Status: DC
  Administered 2011-06-28: 1000 mL
  Filled 2011-06-28 (×2): qty 1000

## 2011-06-28 MED ORDER — ENOXAPARIN SODIUM 40 MG/0.4ML ~~LOC~~ SOLN
40.0000 mg | SUBCUTANEOUS | Status: DC
  Administered 2011-06-28: 40 mg via SUBCUTANEOUS
  Filled 2011-06-28 (×2): qty 0.4

## 2011-06-28 MED ORDER — MIDAZOLAM HCL 2 MG/2ML IJ SOLN: 1.0000 mg | INTRAMUSCULAR | Status: DC | PRN

## 2011-06-28 MED ORDER — CHLORHEXIDINE GLUCONATE 0.12 % MT SOLN
15.0000 mL | Freq: Two times a day (BID) | OROMUCOSAL | Status: DC
  Administered 2011-06-28 – 2011-07-16 (×35): 15 mL via OROMUCOSAL
  Filled 2011-06-28 (×45): qty 15

## 2011-06-28 MED ORDER — PIVOT 1.5 CAL PO LIQD
1000.0000 mL | ORAL | Status: DC
  Filled 2011-06-28: qty 1000

## 2011-06-28 NOTE — Consult Note (Signed)
MEDICATION RELATED CONSULT NOTE - INITIAL   Pharmacy Consult for Aranesp Indication: Anemia  No Known Allergies  Patient Measurements: Height: 5\' 3"  (160 cm) Weight: 259 lb 14.8 oz (117.9 kg) (bed weight ) IBW/kg (Calculated) : 52.4  Adjusted Body Weight: 75.44kg  Vital Signs: Temp: 101.2 F (38.4 C) (05/29 0739) Temp src: Axillary (05/29 0739) BP: 151/71 mmHg (05/29 0807) Pulse Rate: 136  (05/29 0807) Intake/Output from previous day: 05/28 0701 - 05/29 0700 In: 5759.7 [I.V.:3526.7; Blood:670; NG/GT:90; IV Piggyback:1473] Out: 1370 [Urine:870; Emesis/NG output:300; Drains:200] Intake/Output from this shift:    Labs:  Basename 06/28/11 0410 06/27/11 1130 06/27/11 0435 06/27/11 0037 06/27/11 0034  WBC 11.1* 8.9 21.0* -- --  HGB 9.6* 6.9* 9.9* -- --  HCT 26.8* 19.7* 28.4* -- --  PLT 106* 109* 166 -- --  APTT -- -- -- -- --  CREATININE 1.17* -- 1.24* 1.50* --  LABCREA -- -- -- -- --  CREATININE 1.17* -- 1.24* 1.50* --  CREAT24HRUR -- -- -- -- --  MG 1.5 -- -- -- --  PHOS -- -- -- -- --  ALBUMIN -- -- 2.0* -- 3.4*  PROT -- -- 4.1* -- 7.0  ALBUMIN -- -- 2.0* -- 3.4*  AST -- -- 98* -- 68*  ALT -- -- 44* -- 45*  ALKPHOS -- -- 35* -- 59  BILITOT -- -- 0.4 -- 0.2*  BILIDIR -- -- -- -- --  IBILI -- -- -- -- --   Estimated Creatinine Clearance: 91.2 ml/min (by C-G formula based on Cr of 1.17).  Medical History: History reviewed. No pertinent past medical history.  Assessment: 26yo trauma patient to begin aranesp for anemia (acute blood loss and critical illness). She received blood yesterday, however, patient is Jehov[employer]'s [employer] and can no longer receive blood products. She will be going back to the OR tomorrow for more orthopedic procedures and will be at risk for life-threatening anemia peri-operatively. Hgb on admission was 12.9.  Goal of Therapy:  Hgb 11-12  Plan:  1) Aranesp 100 mcg SQ weekly at 1800 for 3 doses with a 4th dose if patient remains within ICU  when 4th dose is scheduled 2) Monitor daily CBC, weekly reticulocyte count 3) In the absence of transfusions, if Hgb increases >1 gm/dL over 2 week period, reduce dose of growth factor ~25%   Fredrik Rigger 06/28/2011,9:04 AM
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
## 2011-06-28 NOTE — Progress Notes (Signed)
CRITICAL VALUE ALERT  Critical value received:  Gent Level 4.2  Date of notification:  06/28/11  Time of notification:  1240  Critical value read back:yes  Nurse who received alert:  Demetria Pore, RN   MD notified (1st page):  Candise Bowens, PhD   Time MD responded:  1255  Orders received to continue Gentamycin regimen, hanging next dose at 1300.

## 2011-06-28 NOTE — Progress Notes (Signed)
Pharmacy: Gentamicin (Day #2)  10 hour Gentamicin level (drawn 5/28 @ 2300) = 4.2. Per the [facility], patient still qualifies for q24h dosing. Can assume true trough is <2 which is the goal.  Plan:  1) Continue Gentamicin 530mg  IV q24 2) Continue to follow renal function, clinical status

## 2011-06-28 NOTE — Progress Notes (Signed)
Follow up - Trauma and Critical Care  Patient Details:    Tonya [name] is an 26 y.o. female.  Lines/tubes : AIRWAYS 7.5 mm (Active)  Secured at (cm) 23 cm 06/27/2011 12:00 AM     Airway 7.5 mm (Active)  Secured at (cm) 23 cm 06/28/2011  7:59 AM  Measured From Lips 06/28/2011  7:59 AM  Secured Location Right 06/28/2011  7:59 AM  Secured By [employer] 06/28/2011  7:59 AM  Tube Holder Repositioned Yes 06/28/2011  7:59 AM     CVC Triple Lumen 06/27/11 Left Subclavian (Active)  Site Assessment Clean;Dry;Intact 06/27/2011  9:00 PM  Dressing Type Transparent 06/27/2011  9:00 PM  Dressing Status Clean;Dry;Intact;Antimicrobial disc in place 06/27/2011  9:00 PM  Line Care Connections checked and tightened 06/27/2011  9:00 PM  Dressing Change Due 06/28/11 06/27/2011  9:00 PM     Arterial Line 06/27/11 Left Radial (Active)  Site Assessment Clean;Dry;Intact 06/27/2011  9:00 PM  Line Status Pulsatile blood flow 06/27/2011  9:00 PM  Art Line Waveform Appropriate 06/27/2011  9:00 PM  Art Line Interventions Zeroed and calibrated;Leveled;Connections checked and tightened;Flushed per protocol 06/27/2011  7:00 AM  Color/Movement/Sensation Capillary refill less than 3 sec 06/27/2011  9:00 PM  Dressing Type Transparent 06/27/2011  9:00 PM  Dressing Status Clean;Dry;Intact 06/27/2011  9:00 PM     Negative Pressure Wound Therapy Leg Right (Active)  Last dressing change 06/27/11 06/27/2011  5:00 AM  Cycle Continuous 06/27/2011  8:10 PM  Target Pressure (mmHg) 125 06/27/2011  8:10 PM  Canister Changed No 06/27/2011  5:00 AM  Dressing Status Intact 06/27/2011  8:10 PM  Drainage Amount Minimal 06/27/2011  8:10 PM  Drainage Description Serosanguineous 06/27/2011  8:10 PM  Output (mL) 50 mL 06/28/2011  6:00 AM     NG/OG Tube Orogastric (Active)  Placement Verification Auscultation 06/27/2011  8:10 PM  Site Assessment Clean;Dry;Intact 06/27/2011  8:10 PM  Status Suction-low intermittent 06/27/2011  8:10 PM    Drainage Appearance Bile 06/27/2011  8:10 PM  Intake (mL) 30 mL 06/27/2011  8:10 PM  Output (mL) 0 mL 06/28/2011  6:00 AM    Microbiology/Sepsis markers: Results for orders placed during the hospital encounter of 06/27/11  MRSA PCR SCREENING     Status: Normal   Collection Time   06/27/11  4:45 AM      Component Value Range Status Comment   MRSA by PCR NEGATIVE  NEGATIVE  Final     Anti-infectives:  Anti-infectives     Start     Dose/Rate Route Frequency Ordered Stop   06/27/11 1400   ceFAZolin (ANCEF) IVPB 1 g/50 mL premix        1 g 100 mL/hr over 30 Minutes Intravenous 3 times per day 06/27/11 1056     06/27/11 1300   gentamicin (GARAMYCIN) 530 mg in dextrose 5 % 100 mL IVPB        530 mg 113.3 mL/hr over 60 Minutes Intravenous Every 24 hours 06/27/11 1214            Best Practice/Protocols:  On Protonix therapy. Continous Sedation NO DVT prophylaxis  Consults: Treatment Team:  Budd Palmer, MD    Events:  Subjective:    Overnight Issues: Patient received blood yesterday for symptomatic anemia.  No other significant issues overnight.  Objective:  Vital signs for last 24 hours: Temp:  [98.3 F (36.8 C)-101.5 F (38.6 C)] 101.2 F (38.4 C) (05/29 0739) Pulse Rate:  [110-136] 136  (  05/29 0807) Resp:  [0-23] 22  (05/29 0807) BP: (88-151)/(50-77) 151/71 mmHg (05/29 0807) SpO2:  [98 %-100 %] 100 % (05/29 0807) Arterial Line BP: (93-134)/(51-69) 134/66 mmHg (05/29 0700) FiO2 (%):  [29.9 %-40.3 %] 30 % (05/29 0807) Weight:  [117.9 kg (259 lb 14.8 oz)] 117.9 kg (259 lb 14.8 oz) (05/29 0300)  Hemodynamic parameters for last 24 hours: CVP:  [7 mmHg-11 mmHg] 8 mmHg  Intake/Output from previous day: 05/28 0701 - 05/29 0700 In: 5759.7 [I.V.:3526.7; Blood:670; NG/GT:90; IV Piggyback:1473] Out: 1370 [Urine:870; Emesis/NG output:300; Drains:200]  Intake/Output this shift:    Vent settings for last 24 hours: Vent Mode:  [-] PSV FiO2 (%):  [29.9 %-40.3 %] 30  % Set Rate:  [18 bmp-22 bmp] 18 bmp Vt Set:  [550 mL] 550 mL PEEP:  [4.7 cmH20-5 cmH20] 5 cmH20 Pressure Support:  [10 cmH20] 10 cmH20 Plateau Pressure:  [17 cmH20-18 cmH20] 17 cmH20  Physical Exam:  General: no respiratory distress and Starting to wean from ventilator, but weill not extubated because planning on going to surgery tomorrow. Neuro: RASS -3 or deeper HEENT/Neck: no JVD, ETT WNL , PERRL and forehead abrasions are okay. Resp: clear to auscultation bilaterally CVS: Sinus tachycardia GI: soft, nontender, BS WNL, no r/g and hypoactive BS Extremities: edema 3+, amputation present and good palpable pulses on the left foot. Abrasions are all over her left side and her back.  [facility] from [facility]..  Results for orders placed during the hospital encounter of 06/27/11 (from the past 24 hour(s))  GLUCOSE, CAPILLARY     Status: Normal   Collection Time   06/27/11  9:07 AM      Component Value Range   Glucose-Capillary 94  70 - 99 (mg/dL)  CBC     Status: Abnormal   Collection Time   06/27/11 11:30 AM      Component Value Range   WBC 8.9  4.0 - 10.5 (K/uL)   RBC 2.39 (*) 3.87 - 5.11 (MIL/uL)   Hemoglobin 6.9 (*) 12.0 - 15.0 (g/dL)   HCT 16.1 (*) 09.6 - 46.0 (%)   MCV 82.4  78.0 - 100.0 (fL)   MCH 28.9  26.0 - 34.0 (pg)   MCHC 35.0  30.0 - 36.0 (g/dL)   RDW 04.5  40.9 - 81.1 (%)   Platelets 109 (*) 150 - 400 (K/uL)  POCT I-STAT 3, BLOOD GAS (G3+)     Status: Abnormal   Collection Time   06/27/11 11:38 AM      Component Value Range   pH, Arterial 7.412 (*) 7.350 - 7.400    pCO2 arterial 27.1 (*) 35.0 - 45.0 (mmHg)   pO2, Arterial 200.0 (*) 80.0 - 100.0 (mmHg)   Bicarbonate 17.2 (*) 20.0 - 24.0 (mEq/L)   TCO2 18  0 - 100 (mmol/L)   O2 Saturation 100.0     Acid-base deficit 7.0 (*) 0.0 - 2.0 (mmol/L)   Patient temperature 99.7 F     Collection site ARTERIAL LINE     Drawn by Operator     Sample type ARTERIAL    GLUCOSE, CAPILLARY     Status: Normal   Collection  Time   06/27/11 11:38 AM      Component Value Range   Glucose-Capillary 97  70 - 99 (mg/dL)  GLUCOSE, CAPILLARY     Status: Normal   Collection Time   06/27/11  3:19 PM      Component Value Range   Glucose-Capillary 88  70 -  99 (mg/dL)  GLUCOSE, CAPILLARY     Status: Normal   Collection Time   06/27/11  7:41 PM      Component Value Range   Glucose-Capillary 88  70 - 99 (mg/dL)  GLUCOSE, CAPILLARY     Status: Normal   Collection Time   06/27/11 11:49 PM      Component Value Range   Glucose-Capillary 96  70 - 99 (mg/dL)  CBC     Status: Abnormal   Collection Time   06/28/11  4:10 AM      Component Value Range   WBC 11.1 (*) 4.0 - 10.5 (K/uL)   RBC 3.23 (*) 3.87 - 5.11 (MIL/uL)   Hemoglobin 9.6 (*) 12.0 - 15.0 (g/dL)   HCT 54.0 (*) 98.1 - 46.0 (%)   MCV 83.0  78.0 - 100.0 (fL)   MCH 29.7  26.0 - 34.0 (pg)   MCHC 35.8  30.0 - 36.0 (g/dL)   RDW 19.1  47.8 - 29.5 (%)   Platelets 106 (*) 150 - 400 (K/uL)  BASIC METABOLIC PANEL     Status: Abnormal   Collection Time   06/28/11  4:10 AM      Component Value Range   Sodium 146 (*) 135 - 145 (mEq/L)   Potassium 4.2  3.5 - 5.1 (mEq/L)   Chloride 122 (*) 96 - 112 (mEq/L)   CO2 18 (*) 19 - 32 (mEq/L)   Glucose, Bld 105 (*) 70 - 99 (mg/dL)   BUN 15  6 - 23 (mg/dL)   Creatinine, Ser 6.21 (*) 0.50 - 1.10 (mg/dL)   Calcium 6.5 (*) 8.4 - 10.5 (mg/dL)   GFR calc non Af Amer 64 (*) >90 (mL/min)   GFR calc Af Amer 74 (*) >90 (mL/min)  MAGNESIUM     Status: Normal   Collection Time   06/28/11  4:10 AM      Component Value Range   Magnesium 1.5  1.5 - 2.5 (mg/dL)  LACTIC ACID, PLASMA     Status: Normal   Collection Time   06/28/11  4:10 AM      Component Value Range   Lactic Acid, Venous 1.0  0.5 - 2.2 (mmol/L)  POCT I-STAT 3, BLOOD GAS (G3+)     Status: Abnormal   Collection Time   06/28/11  4:16 AM      Component Value Range   pH, Arterial 7.313 (*) 7.350 - 7.400    pCO2 arterial 33.5 (*) 35.0 - 45.0 (mmHg)   pO2, Arterial 91.0  80.0 -  100.0 (mmHg)   Bicarbonate 16.9 (*) 20.0 - 24.0 (mEq/L)   TCO2 18  0 - 100 (mmol/L)   O2 Saturation 96.0     Acid-base deficit 8.0 (*) 0.0 - 2.0 (mmol/L)   Patient temperature 99.8 F     Sample type ARTERIAL       Assessment/Plan:   NEURO  Altered Mental Status:  change in mental status, sedation, schizophrenia and apparent history of bipolar disease.   Plan: Wean sedation as tolerated.  PULM  No current pulmonary issues, tolerating weaning well.  CXR shows no focal infiltrates or injuries.   Plan: Wean and exercise, but do not extubate  CARDIO  Sinus Tachycardia   Plan: No specific treatment.  RENAL  Oliguria (probably hypovolemia)   Plan: give more volume.  Patient is also acidotic, under resuscitated still in spite of volume given yesterday.  GI  No known abnormalities, will give tube feedings.   Plan: Tube  feedings until midnight  ID  No specific infectious issues   Plan: On Kefzol for orthopedic problems  HEME  Anemia acute blood loss anemia and anemia of critical illness) Leukocytosis (neutrophilia)   Plan: Patient recently told to be JW religion.  No further blood products  ENDO Hypernatremia (moderate (146 - 155 meq/dl))   Plan: Change IVFs  Global Issues  Patient has multiple problems, but most significant are her orthopedic injuries.  Being a Administrator.  Will order Aranesp, needs adequate DVT prophylaxis.  Consider IVC filter.    LOS: 1 day   Additional comments:I reviewed the patient's new clinical lab test results. cbc/bmet and I reviewed the patients new imaging test results. CXR  Critical Care Total Time*: 45 Minutes including conference time discussing condition with the patient's mother.  Audra Bellard III,Duayne Brideau O 06/28/2011  *Care during the described time interval was provided by me and/or other providers on the critical care team.  I have reviewed this patient's available data, including medical history, events of note, physical  examination and test results as part of my evaluation.

## 2011-06-29 ENCOUNTER — Inpatient Hospital Stay (HOSPITAL_COMMUNITY): Payer: Medicare Other

## 2011-06-29 ENCOUNTER — Encounter (HOSPITAL_COMMUNITY): Admission: EM | Disposition: A | Discharge status: Rehabilitation Facility | Payer: Self-pay | Source: Ambulatory Visit

## 2011-06-29 ENCOUNTER — Inpatient Hospital Stay (HOSPITAL_COMMUNITY): Payer: Medicare Other | Admitting: Anesthesiology

## 2011-06-29 ENCOUNTER — Encounter (HOSPITAL_COMMUNITY): Payer: Self-pay | Admitting: Anesthesiology

## 2011-06-29 HISTORY — PX: AMPUTATION: SHX166

## 2011-06-29 HISTORY — PX: I & D EXTREMITY: SHX5045

## 2011-06-29 LAB — BASIC METABOLIC PANEL
BUN: 9 mg/dL (ref 6–23)
CO2: 20 mEq/L (ref 19–32)
Calcium: 6.7 mg/dL — ABNORMAL LOW (ref 8.4–10.5)
Chloride: 115 mEq/L — ABNORMAL HIGH (ref 96–112)
Creatinine, Ser: 0.98 mg/dL (ref 0.50–1.10)

## 2011-06-29 LAB — GLUCOSE, CAPILLARY
Glucose-Capillary: 101 mg/dL — ABNORMAL HIGH (ref 70–99)
Glucose-Capillary: 106 mg/dL — ABNORMAL HIGH (ref 70–99)
Glucose-Capillary: 100 mg/dL — ABNORMAL HIGH (ref 70–99)

## 2011-06-29 LAB — CBC
HCT: 21.7 % — ABNORMAL LOW (ref 36.0–46.0)
Hemoglobin: 7.6 g/dL — ABNORMAL LOW (ref 12.0–15.0)
MCHC: 35 g/dL (ref 30.0–36.0)
MCV: 84.8 fL (ref 78.0–100.0)

## 2011-06-29 LAB — DIFFERENTIAL
Basophils Relative: 0 % (ref 0–1)
Eosinophils Relative: 3 % (ref 0–5)
Lymphocytes Relative: 23 % (ref 12–46)
Monocytes Absolute: 0.8 10*3/uL (ref 0.1–1.0)
Monocytes Relative: 9 % (ref 3–12)
Neutro Abs: 6 10*3/uL (ref 1.7–7.7)

## 2011-06-29 SURGERY — AMPUTATION, ABOVE KNEE
Anesthesia: General | Site: Leg Upper | Laterality: Right | Wound class: Contaminated

## 2011-06-29 MED ORDER — BUPIVACAINE-EPINEPHRINE 0.25% -1:200000 IJ SOLN
INTRAMUSCULAR | Status: DC | PRN
  Administered 2011-06-29: 30 mL

## 2011-06-29 MED ORDER — ALBUMIN HUMAN 5 % IV SOLN: INTRAVENOUS | Status: DC | PRN

## 2011-06-29 MED ORDER — SODIUM CHLORIDE 0.9 % IR SOLN
Status: DC | PRN
  Administered 2011-06-29 (×2): 3000 mL

## 2011-06-29 MED ORDER — PHENYLEPHRINE HCL 10 MG/ML IJ SOLN
INTRAMUSCULAR | Status: DC | PRN
  Administered 2011-06-29 (×6): 80 ug via INTRAVENOUS
  Administered 2011-06-29: 120 ug via INTRAVENOUS
  Administered 2011-06-29 (×2): 80 ug via INTRAVENOUS

## 2011-06-29 MED ORDER — LACTATED RINGERS IV SOLN
INTRAVENOUS | Status: DC | PRN
  Administered 2011-06-29 (×2): via INTRAVENOUS

## 2011-06-29 MED ORDER — SELENIUM 50 MCG PO TABS
200.0000 ug | ORAL_TABLET | Freq: Every day | ORAL | Status: AC
  Administered 2011-06-29 – 2011-07-05 (×7): 200 ug
  Filled 2011-06-29 (×7): qty 4

## 2011-06-29 MED ORDER — METOCLOPRAMIDE HCL 5 MG/ML IJ SOLN
10.0000 mg | Freq: Four times a day (QID) | INTRAMUSCULAR | Status: DC
  Administered 2011-06-29 – 2011-06-30 (×3): 10 mg via INTRAVENOUS
  Filled 2011-06-29 (×7): qty 2

## 2011-06-29 MED ORDER — VITAMIN E 15 UNIT/0.3ML PO SOLN
400.0000 [IU] | Freq: Three times a day (TID) | ORAL | Status: AC
  Administered 2011-06-29 – 2011-07-01 (×8): 400 [IU]
  Filled 2011-06-29 (×9): qty 8

## 2011-06-29 MED ORDER — FENTANYL CITRATE 0.05 MG/ML IJ SOLN
INTRAMUSCULAR | Status: DC | PRN
  Administered 2011-06-29: 50 ug via INTRAVENOUS
  Administered 2011-06-29: 100 ug via INTRAVENOUS

## 2011-06-29 MED ORDER — FENTANYL CITRATE 0.05 MG/ML IJ SOLN: 25.0000 ug | INTRAMUSCULAR | Status: DC | PRN

## 2011-06-29 MED ORDER — MIDAZOLAM HCL 5 MG/5ML IJ SOLN
INTRAMUSCULAR | Status: DC | PRN
  Administered 2011-06-29: 2 mg via INTRAVENOUS

## 2011-06-29 MED ORDER — VITAMIN C 500 MG PO TABS
1000.0000 mg | ORAL_TABLET | Freq: Three times a day (TID) | ORAL | Status: AC
  Administered 2011-06-29 – 2011-07-06 (×21): 1000 mg
  Filled 2011-06-29 (×22): qty 2

## 2011-06-29 MED ORDER — SODIUM CHLORIDE 0.9 % IV SOLN
2500.0000 ug | INTRAVENOUS | Status: DC | PRN
  Administered 2011-06-29 (×2): 150 ug/h via INTRAVENOUS

## 2011-06-29 MED ORDER — MEPERIDINE HCL 50 MG/ML IJ SOLN: 6.2500 mg | INTRAMUSCULAR | Status: DC | PRN

## 2011-06-29 MED ORDER — ROCURONIUM BROMIDE 100 MG/10ML IV SOLN
INTRAVENOUS | Status: DC | PRN
  Administered 2011-06-29: 50 mg via INTRAVENOUS
  Administered 2011-06-29: 20 mg via INTRAVENOUS

## 2011-06-29 MED ORDER — HETASTARCH-ELECTROLYTES 6 % IV SOLN
500.0000 mL | Freq: Once | INTRAVENOUS | Status: AC
  Administered 2011-06-29: 500 mL via INTRAVENOUS
  Filled 2011-06-29: qty 500

## 2011-06-29 MED ORDER — MIDAZOLAM HCL 2 MG/2ML IJ SOLN: 0.5000 mg | Freq: Once | INTRAMUSCULAR | Status: DC | PRN

## 2011-06-29 MED ORDER — SODIUM CHLORIDE 0.9 % IV SOLN
10.0000 mg | INTRAVENOUS | Status: DC | PRN
  Administered 2011-06-29: 5 ug/min via INTRAVENOUS

## 2011-06-29 MED ORDER — LACTATED RINGERS IV SOLN
INTRAVENOUS | Status: DC
  Administered 2011-06-29: 18:00:00 via INTRAVENOUS

## 2011-06-29 MED ORDER — SODIUM CHLORIDE 0.9 % IV SOLN
1.0000 g | Freq: Once | INTRAVENOUS | Status: AC
  Administered 2011-06-29: 1 g via INTRAVENOUS
  Filled 2011-06-29: qty 10

## 2011-06-29 MED ORDER — SODIUM CHLORIDE 0.9 % IV SOLN
50.0000 mg | INTRAVENOUS | Status: DC | PRN
  Administered 2011-06-29: 3 mg/h via INTRAVENOUS

## 2011-06-29 MED ORDER — PIVOT 1.5 CAL PO LIQD
1000.0000 mL | ORAL | Status: DC
  Filled 2011-06-29 (×4): qty 1000

## 2011-06-29 MED ORDER — PROMETHAZINE HCL 25 MG/ML IJ SOLN: 6.2500 mg | INTRAMUSCULAR | Status: DC | PRN

## 2011-06-29 MED ORDER — VECURONIUM BROMIDE 10 MG IV SOLR
INTRAVENOUS | Status: DC | PRN
  Administered 2011-06-29: 4 mg via INTRAVENOUS
  Administered 2011-06-29 (×2): 3 mg via INTRAVENOUS

## 2011-06-29 SURGICAL SUPPLY — 87 items
BANDAGE ELASTIC 4 VELCRO ST LF (GAUZE/BANDAGES/DRESSINGS) ×16 IMPLANT
BANDAGE ELASTIC 6 VELCRO ST LF (GAUZE/BANDAGES/DRESSINGS) ×12 IMPLANT
BANDAGE ESMARK 6X9 LF (GAUZE/BANDAGES/DRESSINGS) IMPLANT
BANDAGE GAUZE ELAST BULKY 4 IN (GAUZE/BANDAGES/DRESSINGS) ×16 IMPLANT
BIT DRILL 7/64X5 DISP (BIT) ×4 IMPLANT
BLADE SURG 10 STRL SS (BLADE) ×8 IMPLANT
BNDG COHESIVE 4X5 TAN STRL (GAUZE/BANDAGES/DRESSINGS) ×4 IMPLANT
BNDG COHESIVE 6X5 TAN STRL LF (GAUZE/BANDAGES/DRESSINGS) ×4 IMPLANT
BNDG ESMARK 6X9 LF (GAUZE/BANDAGES/DRESSINGS)
BNDG GAUZE STRTCH 6 (GAUZE/BANDAGES/DRESSINGS) ×12 IMPLANT
BRUSH SCRUB DISP (MISCELLANEOUS) ×8 IMPLANT
CLOTH BEACON ORANGE TIMEOUT ST (SAFETY) ×4 IMPLANT
COVER MAYO STAND STRL (DRAPES) ×4 IMPLANT
COVER SURGICAL LIGHT HANDLE (MISCELLANEOUS) ×8 IMPLANT
CUFF TOURNIQUET SINGLE 34IN LL (TOURNIQUET CUFF) IMPLANT
CUFF TOURNIQUET SINGLE 44IN (TOURNIQUET CUFF) ×4 IMPLANT
DRAIN PENROSE 1/2X12 LTX STRL (WOUND CARE) IMPLANT
DRAPE C-ARM 42X72 X-RAY (DRAPES) ×4 IMPLANT
DRAPE C-ARMOR (DRAPES) ×8 IMPLANT
DRAPE EXTREMITY T 121X128X90 (DRAPE) ×4 IMPLANT
DRAPE INCISE IOBAN 66X45 STRL (DRAPES) ×4 IMPLANT
DRAPE ORTHO SPLIT 77X108 STRL (DRAPES) ×2
DRAPE PROXIMA HALF (DRAPES) ×8 IMPLANT
DRAPE SURG ORHT 6 SPLT 77X108 (DRAPES) ×6 IMPLANT
DRAPE U-SHAPE 47X51 STRL (DRAPES) ×4 IMPLANT
DRSG ADAPTIC 3X8 NADH LF (GAUZE/BANDAGES/DRESSINGS) ×4 IMPLANT
DRSG MEPITEL 4X7.2 (GAUZE/BANDAGES/DRESSINGS) ×8 IMPLANT
DRSG VAC ATS MED SENSATRAC (GAUZE/BANDAGES/DRESSINGS) ×4 IMPLANT
ELECT CAUTERY BLADE 6.4 (BLADE) IMPLANT
ELECT REM PT RETURN 9FT ADLT (ELECTROSURGICAL) ×4
ELECTRODE REM PT RTRN 9FT ADLT (ELECTROSURGICAL) ×3 IMPLANT
EVACUATOR 1/8 PVC DRAIN (DRAIN) IMPLANT
GLOVE BIO SURGEON STRL SZ7.5 (GLOVE) ×8 IMPLANT
GLOVE BIO SURGEON STRL SZ8 (GLOVE) ×8 IMPLANT
GLOVE BIO SURGEON STRL SZ8.5 (GLOVE) ×4 IMPLANT
GLOVE BIOGEL PI IND STRL 7.5 (GLOVE) ×6 IMPLANT
GLOVE BIOGEL PI IND STRL 8 (GLOVE) ×6 IMPLANT
GLOVE BIOGEL PI INDICATOR 7.5 (GLOVE) ×2
GLOVE BIOGEL PI INDICATOR 8 (GLOVE) ×2
GOWN PREVENTION PLUS XLARGE (GOWN DISPOSABLE) ×8 IMPLANT
GOWN STRL NON-REIN LRG LVL3 (GOWN DISPOSABLE) ×8 IMPLANT
HANDPIECE INTERPULSE COAX TIP (DISPOSABLE)
KIT BASIN OR (CUSTOM PROCEDURE TRAY) ×4 IMPLANT
KIT ROOM TURNOVER OR (KITS) ×4 IMPLANT
MANIFOLD NEPTUNE II (INSTRUMENTS) ×4 IMPLANT
NEEDLE HYPO 25GX1X1/2 BEV (NEEDLE) ×4 IMPLANT
NS IRRIG 1000ML POUR BTL (IV SOLUTION) ×4 IMPLANT
PACK GENERAL/GYN (CUSTOM PROCEDURE TRAY) ×4 IMPLANT
PACK ORTHO EXTREMITY (CUSTOM PROCEDURE TRAY) ×4 IMPLANT
PAD ARMBOARD 7.5X6 YLW CONV (MISCELLANEOUS) ×8 IMPLANT
PAD CAST 3X4 CTTN HI CHSV (CAST SUPPLIES) ×9 IMPLANT
PAD CAST 4YDX4 CTTN HI CHSV (CAST SUPPLIES) ×3 IMPLANT
PADDING CAST COTTON 3X4 STRL (CAST SUPPLIES) ×3
PADDING CAST COTTON 4X4 STRL (CAST SUPPLIES) ×1
PADDING CAST COTTON 6X4 STRL (CAST SUPPLIES) ×4 IMPLANT
SET HNDPC FAN SPRY TIP SCT (DISPOSABLE) IMPLANT
SPLINT FIBERGLASS 4X30 (CAST SUPPLIES) ×4 IMPLANT
SPONGE GAUZE 4X4 12PLY (GAUZE/BANDAGES/DRESSINGS) ×4 IMPLANT
SPONGE LAP 18X18 X RAY DECT (DISPOSABLE) ×4 IMPLANT
STAPLER VISISTAT 35W (STAPLE) ×4 IMPLANT
STOCKINETTE IMPERVIOUS 9X36 MD (GAUZE/BANDAGES/DRESSINGS) ×4 IMPLANT
STOCKINETTE IMPERVIOUS LG (DRAPES) IMPLANT
STRIP CLOSURE SKIN 1/2X4 (GAUZE/BANDAGES/DRESSINGS) ×4 IMPLANT
SUT ETHILON 2 0 PSLX (SUTURE) IMPLANT
SUT PDS AB 1 CTX 36 (SUTURE) ×8 IMPLANT
SUT PDS AB 2-0 CT1 27 (SUTURE) IMPLANT
SUT PROLENE 3 0 PS 1 (SUTURE) ×4 IMPLANT
SUT PROLENE 3 0 PS 2 (SUTURE) IMPLANT
SUT SILK 0 TIES 10X30 (SUTURE) ×4 IMPLANT
SUT SILK 2 0 (SUTURE)
SUT SILK 2 0 SH CR/8 (SUTURE) ×4 IMPLANT
SUT SILK 2-0 18XBRD TIE 12 (SUTURE) IMPLANT
SUT VIC AB 0 CT1 27 (SUTURE) ×2
SUT VIC AB 0 CT1 27XBRD ANBCTR (SUTURE) ×6 IMPLANT
SUT VIC AB 2-0 CT1 27 (SUTURE) ×2
SUT VIC AB 2-0 CT1 TAPERPNT 27 (SUTURE) ×6 IMPLANT
SUT VIC AB 2-0 CT3 27 (SUTURE) IMPLANT
SWAB COLLECTION DEVICE MRSA (MISCELLANEOUS) IMPLANT
SYR CONTROL 10ML LL (SYRINGE) ×4 IMPLANT
TOWEL OR 17X24 6PK STRL BLUE (TOWEL DISPOSABLE) ×4 IMPLANT
TOWEL OR 17X26 10 PK STRL BLUE (TOWEL DISPOSABLE) ×8 IMPLANT
TRAY FOLEY CATH 14FR (SET/KITS/TRAYS/PACK) ×4 IMPLANT
TUBE ANAEROBIC SPECIMEN COL (MISCELLANEOUS) IMPLANT
TUBE CONNECTING 12X1/4 (SUCTIONS) ×4 IMPLANT
UNDERPAD 30X30 INCONTINENT (UNDERPADS AND DIAPERS) ×4 IMPLANT
WATER STERILE IRR 1000ML POUR (IV SOLUTION) ×4 IMPLANT
YANKAUER SUCT BULB TIP NO VENT (SUCTIONS) ×4 IMPLANT

## 2011-06-29 NOTE — Progress Notes (Signed)
Patient transported to OR.  Informed consent sent with patient and report given to CRNA.
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
## 2011-06-29 NOTE — Anesthesia Postprocedure Evaluation (Signed)
  Anesthesia Post-op Note  Patient: Tonya [name]  Procedure(s) Performed: Procedure(s) (LRB): AMPUTATION ABOVE KNEE (Right) IRRIGATION AND DEBRIDEMENT EXTREMITY (Right)  Patient Location: PACU and SICU  Anesthesia Type: General  Level of Consciousness: Patient remains intubated per anesthesia plan  Airway and Oxygen Therapy: Patient remains intubated per anesthesia plan  Post-op Pain: mild  Post-op Assessment: Post-op Vital signs reviewed  Post-op Vital Signs: Reviewed  Complications: No apparent anesthesia complications

## 2011-06-29 NOTE — Progress Notes (Signed)
Patient ID: Tonya [name], female   DOB: 04-23-1985, 26 y.o.   MRN: [id number] I spoke with the patient's mother at the bedside. Patient just returned from the OR with  Dr. Carola Frost.  I updated her mother on her current clinical situation and the plan of care. I answered her questions. Hopefully we can progress toward extubation tomorrow.  Will supplement calcium and re-start TF. Patient's mother states patient is a JW so no blood products are to be given. [name], MD, MPH, FACS Pager: [phone]

## 2011-06-29 NOTE — Anesthesia Preprocedure Evaluation (Addendum)
Anesthesia Evaluation  Patient identified by MRN, date of birth, ID band Patient awake  General Assessment Comment:Pt intubated and ventilated  Reviewed: Allergy & Precautions, H&P , NPO status , Patient's Chart, lab work & pertinent test results, Unable to perform ROS - Chart review only  Airway      Comment: intubated Dental   Pulmonary          Cardiovascular negative cardio ROS      Neuro/Psych negative neurological ROS  negative psych ROS   GI/Hepatic negative GI ROS, Neg liver ROS,   Endo/Other  negative endocrine ROS  Renal/GU negative Renal ROS  negative genitourinary   Musculoskeletal negative musculoskeletal ROS (+)   Abdominal   Peds negative pediatric ROS (+)  Hematology negative hematology ROS (+) JEHOVAH'S WITNESS  Anesthesia Other Findings   Reproductive/Obstetrics negative OB ROS                           Anesthesia Physical Anesthesia Plan  ASA: II  Anesthesia Plan: General   Post-op Pain Management:    Induction:   Airway Management Planned: Oral ETT  Additional Equipment:   Intra-op Plan:   Post-operative Plan: Post-operative intubation/ventilation  Informed Consent: I have reviewed the patients History and Physical, chart, labs and discussed the procedure including the risks, benefits and alternatives for the proposed anesthesia with the patient or authorized representative who has indicated his/her understanding and acceptance.   History available from chart only  Plan Discussed with: CRNA  Anesthesia Plan Comments:         Anesthesia Quick Evaluation
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
## 2011-06-29 NOTE — Brief Op Note (Signed)
06/27/2011 - 06/29/2011  3:48 PM  PATIENT:  Tonya [name]  26 y.o. female  PRE-OPERATIVE DIAGNOSIS:   1. Right through knee amputation 2. L tibia frx s/p ex-fix 3. R forearm and hand lacerations 4. Extensive abrasions right arm, left hip, left arm, and left shoulder  POST-OPERATIVE DIAGNOSIS:   1. Right through knee amputation 2. L tibia frx s/p ex-fix 3. R forearm and hand lacerations 4. Extensive abrasions right arm, left hip, left arm, and left shoulder 5. Extensor tendon tear APL 6. Extensor tendon tear EDM  PROCEDURE:  Procedure(s) (LRB): 1. AMPUTATION ABOVE KNEE (Right) 2. IRRIGATION AND DEBRIDEMENT EXTREMITY (Right) open wound 3. Extensor tendon repair forearm APL 4. Extensor tendon repair forearm EDM 5. Dressing changes under anesthesia left arm, right arm, left shoulder 6. Application of wound vac, small   SURGEON:  Surgeon(s) and Role:    * Budd Palmer, MD - Primary  PHYSICIAN ASSISTANT: Montez Morita, [facility]  ANESTHESIA:   general  EBL:  Total I/O In: 2702 [I.V.:2172; NG/GT:30; IV Piggyback:500] Out: 1165 [Urine:1065; Blood:100]  BLOOD ADMINISTERED:none  DRAINS: wound vac over right AKA   LOCAL MEDICATIONS USED:  NONE  SPECIMEN:  No Specimen  DISPOSITION OF SPECIMEN:  N/A  COUNTS:  YES  TOURNIQUET:   Total Tourniquet Time Documented: Thigh (Right) - 120 minutes  DICTATION: .Other Dictation: Dictation Number [id number]  PLAN OF CARE: Admit to inpatient   PATIENT DISPOSITION:  ICU - intubated and hemodynamically stable.   Delay start of Pharmacological VTE agent (>24hrs) due to surgical blood loss or risk of bleeding: no

## 2011-06-29 NOTE — Transfer of Care (Signed)
Immediate Anesthesia Transfer of Care Note  Patient: Tonya [name]  Procedure(s) Performed: Procedure(s) (LRB): AMPUTATION ABOVE KNEE (Right) IRRIGATION AND DEBRIDEMENT EXTREMITY (Right)  Patient Location: SICU  Anesthesia Type: General  Level of Consciousness: sedated and unresponsive  Airway & Oxygen Therapy: Patient Spontanous Breathing and Patient remains intubated per anesthesia plan  Post-op Assessment: Report given to PACU RN and Post -op Vital signs reviewed and stable  Post vital signs: Reviewed and stable  Complications: No apparent anesthesia complications
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
## 2011-06-29 NOTE — Preoperative (Signed)
Beta Blockers   Reason not to administer Beta Blockers:Not Applicable 

## 2011-06-30 ENCOUNTER — Inpatient Hospital Stay (HOSPITAL_COMMUNITY): Payer: Medicare Other

## 2011-06-30 ENCOUNTER — Encounter (HOSPITAL_COMMUNITY): Payer: Self-pay | Admitting: Orthopedic Surgery

## 2011-06-30 LAB — BLOOD GAS, ARTERIAL
Bicarbonate: 24.1 mEq/L — ABNORMAL HIGH (ref 20.0–24.0)
PEEP: 5 cmH2O
Patient temperature: 98.6
TCO2: 25.3 mmol/L (ref 0–100)
pCO2 arterial: 38.9 mmHg (ref 35.0–45.0)
pH, Arterial: 7.408 — ABNORMAL HIGH (ref 7.350–7.400)

## 2011-06-30 LAB — CBC
HCT: 18.9 % — ABNORMAL LOW (ref 36.0–46.0)
Hemoglobin: 6.3 g/dL — CL (ref 12.0–15.0)
MCH: 28.6 pg (ref 26.0–34.0)
MCHC: 33.3 g/dL (ref 30.0–36.0)
MCV: 85.9 fL (ref 78.0–100.0)
RDW: 15 % (ref 11.5–15.5)

## 2011-06-30 LAB — RETICULOCYTES: Retic Ct Pct: 3.6 % — ABNORMAL HIGH (ref 0.4–3.1)

## 2011-06-30 LAB — BASIC METABOLIC PANEL
BUN: 7 mg/dL (ref 6–23)
Creatinine, Ser: 0.94 mg/dL (ref 0.50–1.10)
GFR calc Af Amer: >90 mL/min (ref >90)
GFR calc non Af Amer: 84 mL/min — ABNORMAL LOW (ref >90)
Glucose, Bld: 132 mg/dL — ABNORMAL HIGH (ref 70–99)

## 2011-06-30 LAB — GLUCOSE, CAPILLARY
Glucose-Capillary: 138 mg/dL — ABNORMAL HIGH (ref 70–99)
Glucose-Capillary: 127 mg/dL — ABNORMAL HIGH (ref 70–99)

## 2011-06-30 MED ORDER — POTASSIUM CHLORIDE 20 MEQ/15ML (10%) PO LIQD
20.0000 meq | Freq: Two times a day (BID) | ORAL | Status: DC
  Administered 2011-06-30 (×2): 20 meq
  Filled 2011-06-30 (×4): qty 15

## 2011-06-30 MED ORDER — PIVOT 1.5 CAL PO LIQD
1000.0000 mL | ORAL | Status: DC
  Administered 2011-06-30 – 2011-07-16 (×12): 1000 mL
  Filled 2011-06-30 (×22): qty 1000

## 2011-06-30 MED ORDER — PRO-STAT SUGAR FREE PO LIQD
30.0000 mL | Freq: Two times a day (BID) | ORAL | Status: DC
  Administered 2011-06-30 – 2011-07-01 (×3): 30 mL
  Filled 2011-06-30 (×7): qty 30

## 2011-06-30 MED ORDER — FUROSEMIDE 10 MG/ML IJ SOLN
20.0000 mg | Freq: Two times a day (BID) | INTRAMUSCULAR | Status: AC
  Administered 2011-06-30 – 2011-07-01 (×4): 20 mg via INTRAVENOUS
  Filled 2011-06-30 (×7): qty 2

## 2011-06-30 MED ORDER — METOCLOPRAMIDE HCL 5 MG/ML IJ SOLN
5.0000 mg | Freq: Four times a day (QID) | INTRAMUSCULAR | Status: AC
  Administered 2011-06-30 – 2011-07-01 (×5): 5 mg via INTRAVENOUS
  Filled 2011-06-30 (×5): qty 1

## 2011-06-30 MED ORDER — ADULT MULTIVITAMIN LIQUID CH
5.0000 mL | Freq: Every day | ORAL | Status: DC
  Administered 2011-06-30 – 2011-07-09 (×10): 5 mL
  Filled 2011-06-30 (×12): qty 5
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
## 2011-06-30 NOTE — Progress Notes (Signed)
The patient is not weaning very easily today.  ABG Okay.  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  Will need to clarify this through the patient.  We have not given her any blood products since learning of her possible religious preference.  Will continue to try to wean ventilator.  This patient has been seen and I agree with the findings and treatment plan.  Marta Lamas. Gae Bon, MD, FACS [phone] (pager) [phone] (direct pager) Trauma Surgeon

## 2011-06-30 NOTE — Progress Notes (Signed)
Rt note RT entered room at 0751,Trauma  PA had already placed pt on CPAP/PS 15/5

## 2011-06-30 NOTE — Progress Notes (Signed)
Orthopedic Tech Progress Note Patient Details:  Tonya [name] May 18, 1985 [id number]  Ortho Devices Type of Ortho Device: Volar splint Ortho Device/Splint Location: left hand Ortho Device/Splint Interventions: Application   [name] 06/30/2011, 1:56 PM

## 2011-06-30 NOTE — Consult Note (Signed)
Full note to follow Pt with min displaced index finger metacarpal neck fx Do not recommend pinning or orif at current time Will continue to follow Splint hand Ice/elevate

## 2011-06-30 NOTE — Progress Notes (Signed)
CRITICAL VALUE ALERT  Critical value received:  Hgb=6.3  Date of notification:  06/30/11  Time of notification:  0516  Critical value read back:yes  Nurse who received alert:  Burna Cash, RN/ Wilhelmina Mcardle, RN  MD notified (1st page):  Dr. Janee Morn  Time of first page:  0550  MD notified (2nd page):  Time of second page:  Responding MD:  Dr. Janee Morn  Time MD responded:  [phone]  No orders received. Values expected.

## 2011-06-30 NOTE — Progress Notes (Signed)
[place] changed

## 2011-06-30 NOTE — Progress Notes (Addendum)
Nutrition Consult/Follow-up  RD consult for EN management. S/p I & D of right open AKA wound, tendon repairs to forearm, application of smaller wound VAC 5/30. Pivot 1.5 formula off via OGT upon RD room entry -- previously at 30 ml/hr. Per RN, turned off due to anticipation of extubation today; RD to resume orders.  Diet Order:  NPO  Meds: Scheduled Meds:   . antiseptic oral rinse  15 mL Mouth Rinse QID  . calcium gluconate  1 g Intravenous Once  .  ceFAZolin (ANCEF) IV  1 g Intravenous Q8H  . chlorhexidine  15 mL Mouth Rinse BID  . darbepoetin (ARANESP) injection - NON-DIALYSIS  100 mcg Subcutaneous Q Wed-1800  . enoxaparin (LOVENOX) injection  30 mg Subcutaneous Q12H  . furosemide  20 mg Intravenous Q12H  . gentamicin  530 mg Intravenous Q24H  . insulin aspart  0-20 Units Subcutaneous Q4H  . metoCLOPramide (REGLAN) injection  5 mg Intravenous Q6H  . pantoprazole  40 mg Oral Q1200   Or  . pantoprazole (PROTONIX) IV  40 mg Intravenous Q1200  . potassium chloride  20 mEq Per Tube BID  . selenium  200 mcg Per Tube Daily  . vitamin C  1,000 mg Per Tube Q8H  . vitamin e  400 Units Per Tube Q8H  . DISCONTD: metoCLOPramide (REGLAN) injection  10 mg Intravenous Q6H   Continuous Infusions:   . dextrose 5 % and 0.45 % NaCl with KCl 20 mEq/L 75 mL/hr (06/30/11 0949)  . feeding supplement (PIVOT 1.5 CAL)    . fentaNYL infusion INTRAVENOUS 50 mcg/hr (06/30/11 0800)  . midazolam (VERSED) infusion 1 mg/hr (06/30/11 0949)  . phenylephrine (NEO-SYNEPHRINE) Adult infusion    . DISCONTD: feeding supplement (PIVOT 1.5 CAL) 1,000 mL (06/28/11 1630)  . DISCONTD: lactated ringers 125 mL/hr at 06/29/11 1800   PRN Meds:.acetaminophen, dextrose, fentaNYL, midazolam, ondansetron (ZOFRAN) IV, ondansetron, DISCONTD: bupivacaine-EPINEPHrine, DISCONTD: fentaNYL, DISCONTD: fentaNYL, DISCONTD: meperidine (DEMEROL) injection, DISCONTD: midazolam, DISCONTD: midazolam, DISCONTD: promethazine, DISCONTD: sodium  chloride irrigation  Labs:  CMP     Component Value Date/Time   NA 137 06/30/2011 0446   K 3.4* 06/30/2011 0446   CL 109 06/30/2011 0446   CO2 23 06/30/2011 0446   GLUCOSE 132* 06/30/2011 0446   BUN 7 06/30/2011 0446   CREATININE 0.94 06/30/2011 0446   CALCIUM 6.9* 06/30/2011 0446   PROT 4.1* 06/27/2011 0435   ALBUMIN 2.0* 06/27/2011 0435   AST 98* 06/27/2011 0435   ALT 44* 06/27/2011 0435   ALKPHOS 35* 06/27/2011 0435   BILITOT 0.4 06/27/2011 0435   GFRNONAA 84* 06/30/2011 0446   GFRAA >90 06/30/2011 0446     Intake/Output Summary (Last 24 hours) at 06/30/11 1048 Last data filed at 06/30/11 0800  Gross per 24 hour  Intake 4783.67 ml  Output   2755 ml  Net 2028.67 ml    CBG (last 3)   Basename 06/30/11 0737 06/30/11 0411 06/29/11 2321  GLUCAP 138* 125* 107*    Weight Status:  108.7 kg (5/31) -- down likely due to diuresis  Re-estimated needs:  2200 kcals, 100-110 gm protein  Nutrition Dx:  Indaquate Oral Intake r/t inability to eat, VDRF as evidenced by NPO status, ongoing  Goal:  EN to provide 60-70% of estimated calorie needs (22-25 kcals/kg ideal body weight) and 100% of estimated protein needs, based on ASPEN guidelines for permissive underfeeding in critically ill obese individuals, progressing Monitor: EN tolerance, respiratory status, weight, labs, I/O's  Intervention:  Resume Pivot 1.5 formula at 20 ml/hr and increase by 10 ml every 4 hours to goal rate of 35 ml/hr with Prostat liquid protein 30 ml twice daily to provide 1460 total kcals (66% of estimated kcal needs), 109 gm protein (100% of estimated protein needs), 638 ml of free water  Add liquid MVI via tube daily  RD to follow for nutrition care plan  Alger Memos Pager #:  [phone]

## 2011-06-30 NOTE — Progress Notes (Signed)
Pt failed wean at 8/5, rr in the 40's Vt 200'S, placed back on 12/5.

## 2011-06-30 NOTE — Progress Notes (Signed)
Patient ID: Tonya [name], female   DOB: [birth year], 26 y.o.   MRN: [id number]   LOS: 3 days   Subjective: Sedated, on vent.  Objective: Vital signs in last 24 hours: Temp:  [97.4 F (36.3 C)-101.1 F (38.4 C)] 101 F (38.3 C) (05/31 0400) Pulse Rate:  [112-139] 134  (05/31 0700) Resp:  [7-29] 19  (05/31 0700) BP: (106-140)/(50-109) 119/60 mmHg (05/31 0700) SpO2:  [90 %-100 %] 96 % (05/31 0700) Arterial Line BP: (85-133)/(74-127) 86/81 mmHg (05/31 0400) FiO2 (%):  [30 %-60.2 %] 40 % (05/31 0700)    VENT: PRVC/40%/5PEEP  ABG    Component Value Date/Time   PHART 7.408* 06/30/2011 0500   PCO2ART 38.9 06/30/2011 0500   PO2ART 84.7 06/30/2011 0500   HCO3 24.1* 06/30/2011 0500   TCO2 25.3 06/30/2011 0500   ACIDBASEDEF 0.0 06/30/2011 0500   O2SAT 99.2 06/30/2011 0500     UOP: 56ml/h NET: +2537ml/24h TOTAL: +16.9 liters/admission   Lab Results  CBC  Basename 06/30/11 0446 06/29/11 0400  WBC 8.8 9.3  HGB 6.3* 7.6*  HCT 18.9* 21.7*  PLT 104* 98*   BMET  Basename 06/30/11 0446 06/29/11 0400  NA 137 141  K 3.4* 3.8  CL 109 115*  CO2 23 20  GLUCOSE 132* 124*  BUN 7 9  CREATININE 0.94 0.98  CALCIUM 6.9* 6.7*   CBG (last 3)   Basename 06/30/11 0411 06/29/11 2321 06/29/11 1948  GLUCAP 125* 107* 100*    CXR: New left effusion, patchy bilateral ASD (official read pending)   General appearance: no distress and arouses easily, +FC. Resp: Decreased on left Cardio: Tachycardic GI: Soft, +BS. Pulses: 2+ left radial, other extremities warm.   ID Ancef   5/28 to present Gentamicin 5/28 to present  Empiric for open fractures   Assessment/Plan: [facility]  VDRF - wean this morning, will try to extubate later today if possible. Secretions are mild. Left effusion could complicate things, ? Thoracentesis. R traumatic trans-knee BKA - Closed yesterday L tib fib Fx s/p ex fix -- For OR next week if possible but Handy said ex fix could be definitive treatment if  necessary Left 2nd MC fx -- Will ask hand to see at request of Handy ?L adrenal hemorrhage  Multiple abrasions - Xeroform and local care  R forearm 5th digit extensor tendon injury s/p repair Traumatic hernia R lateral abdominal wall - elective repair  Suicidal/bipolar - psych consult once extubated  ABL anemia - patient is JW - mother declines blood transfusions. Friend says patient not observant, may give consent once she's off the vent. Platelets stable this morning. ID -- Will get BAL, urine and blood cultures with fevers. WBC still ok. CV -- Tachycardia likely physiologic response, no treatment at this time. Hypokalemia - supplement  Hyperglycemia - SSI - CBG's low 100's  FEN -- Will hold TF for possible extubation. Diurese, watch pressure. VTE - Lovenox Dispo -- VDRF   Critical care time : 0725 -- 0800  Freeman Caldron, PA-C Pager: [phone] General Trauma PA Pager: [phone]   06/30/2011

## 2011-07-01 ENCOUNTER — Inpatient Hospital Stay (HOSPITAL_COMMUNITY): Payer: Medicare Other

## 2011-07-01 LAB — TYPE AND SCREEN
ABO/RH(D): O POS
Unit division: 0
Unit division: 0

## 2011-07-01 LAB — CBC
Hemoglobin: 7 g/dL — ABNORMAL LOW (ref 12.0–15.0)
MCH: 29.4 pg (ref 26.0–34.0)
MCHC: 34 g/dL (ref 30.0–36.0)
Platelets: 144 10*3/uL — ABNORMAL LOW (ref 150–400)
RDW: 15 % (ref 11.5–15.5)

## 2011-07-01 LAB — GLUCOSE, CAPILLARY
Glucose-Capillary: 141 mg/dL — ABNORMAL HIGH (ref 70–99)
Glucose-Capillary: 130 mg/dL — ABNORMAL HIGH (ref 70–99)

## 2011-07-01 LAB — BASIC METABOLIC PANEL
Calcium: 7.3 mg/dL — ABNORMAL LOW (ref 8.4–10.5)
GFR calc Af Amer: >90 mL/min (ref >90)
GFR calc non Af Amer: 81 mL/min — ABNORMAL LOW (ref >90)
Glucose, Bld: 129 mg/dL — ABNORMAL HIGH (ref 70–99)
Sodium: 136 mEq/L (ref 135–145)

## 2011-07-01 LAB — URINE CULTURE

## 2011-07-01 MED ORDER — VITAMIN E 100 UNT/0.25ML PO OIL
400.0000 [IU] | TOPICAL_OIL | Freq: Three times a day (TID) | ORAL | Status: AC
  Administered 2011-07-02 – 2011-07-06 (×13): 400 [IU]
  Filled 2011-07-01 (×14): qty 1

## 2011-07-01 MED ORDER — POTASSIUM CHLORIDE 10 MEQ/50ML IV SOLN
10.0000 meq | INTRAVENOUS | Status: AC
  Administered 2011-07-01 (×4): 10 meq via INTRAVENOUS
  Filled 2011-07-01: qty 200

## 2011-07-01 NOTE — Progress Notes (Signed)
Patient ID: Debora Chelsea Primus, female   DOB: 05/08/1985, 26 y.o.   MRN: [id number]   LOS: 4 days   Subjective: Sedated, on vent. No major events. Tolerating TF. Good response to diuresis  Objective: Vital signs in last 24 hours: Temp:  [98.4 F (36.9 C)-101.4 F (38.6 C)] 99.6 F (37.6 C) (06/01 0723) Pulse Rate:  [118-143] 125  (06/01 0700) Resp:  [17-34] 18  (06/01 0700) BP: (108-129)/(53-79) 122/65 mmHg (06/01 0700) SpO2:  [93 %-100 %] 98 % (06/01 0700) FiO2 (%):  [39.8 %-40.2 %] 40 % (06/01 0700) Weight:  [247 lb 5.7 oz (112.2 kg)] 247 lb 5.7 oz (112.2 kg) (06/01 0500)    VENT: PRVC/40%/5PEEP  ABG    Component Value Date/Time   PHART 7.408* 06/30/2011 0500   PCO2ART 38.9 06/30/2011 0500   PO2ART 84.7 06/30/2011 0500   HCO3 24.1* 06/30/2011 0500   TCO2 25.3 06/30/2011 0500   ACIDBASEDEF 0.0 06/30/2011 0500   O2SAT 99.2 06/30/2011 0500       Lab Results  CBC  Basename 07/01/11 0430 06/30/11 0446  WBC 12.6* 8.8  HGB 7.0* 6.3*  HCT 20.6* 18.9*  PLT 144* 104*   BMET  Basename 07/01/11 0430 06/30/11 0446  NA 136 137  K 3.3* 3.4*  CL 100 109  CO2 27 23  GLUCOSE 129* 132*  BUN 11 7  CREATININE 0.97 0.94  CALCIUM 7.3* 6.9*   CBG (last 3)   Basename 07/01/11 0720 07/01/11 0050 06/30/11 1958  GLUCAP 148* 141* 106*    CXR: markedly improved cxr- no effusion  General appearance: no distress and arouses easily, +FC. Resp: Decreased on left Cardio: Tachycardic GI: Soft, +BS. Pulses: 2+ left radial, other extremities warm.   ID Ancef   5/28 to present Gentamicin 5/28 to present  Empiric for open fractures   Assessment/Plan: [facility]  VDRF - cxr much improved. Will continue lasix for 2 more doses. Will do PS trial during day. PRVC at night R traumatic trans-knee BKA - Closed yesterday L tib fib Fx s/p ex fix -- For OR next week if possible but Handy said ex fix could be definitive treatment if necessary Left 2nd MC fx -- nothing to do for now ?L adrenal  hemorrhage  Multiple abrasions - Xeroform and local care  R forearm 5th digit extensor tendon injury s/p repair Traumatic hernia R lateral abdominal wall - elective repair  Suicidal/bipolar - psych consult once extubated  ABL anemia - patient is JW - mother declines blood transfusions. Friend says patient not observant, may give consent once she's off the vent. Platelets stable this morning. And hgb stable ID -- f/u BAL, urine and blood cultures with fevers. WBC still ok.; sputum neg so far; wbc slightly up. May be from wounds. Cont IV abx for now  CV -- Tachycardia likely physiologic response, no treatment at this time. Hypokalemia - supplement  Hyperglycemia - SSI - CBG's low 100's  FEN -- cont TF for now . Diurese with 2 more doses, watch pressure. VTE - Lovenox Dispo -- VDRF   Critical care time : 0840-0905  Mary Sella. Andrey Campanile, MD, FACS General, Bariatric, & Minimally Invasive Surgery [facility], [place]   07/01/2011     Patient ID: Breckyn Chelsea Primus, female   DOB: 09/16/1985, 26 y.o.   MRN: [id number]

## 2011-07-02 ENCOUNTER — Inpatient Hospital Stay (HOSPITAL_COMMUNITY): Payer: Medicare Other

## 2011-07-02 LAB — CULTURE, RESPIRATORY W GRAM STAIN: Culture: NO GROWTH

## 2011-07-02 LAB — BASIC METABOLIC PANEL
BUN: 18 mg/dL (ref 6–23)
CO2: 29 mEq/L (ref 19–32)
Chloride: 97 mEq/L (ref 96–112)
GFR calc Af Amer: >90 mL/min (ref >90)
Potassium: 3.4 mEq/L — ABNORMAL LOW (ref 3.5–5.1)

## 2011-07-02 LAB — GLUCOSE, CAPILLARY
Glucose-Capillary: 125 mg/dL — ABNORMAL HIGH (ref 70–99)
Glucose-Capillary: 129 mg/dL — ABNORMAL HIGH (ref 70–99)
Glucose-Capillary: 131 mg/dL — ABNORMAL HIGH (ref 70–99)
Glucose-Capillary: 149 mg/dL — ABNORMAL HIGH (ref 70–99)

## 2011-07-02 LAB — CBC
HCT: 21.3 % — ABNORMAL LOW (ref 36.0–46.0)
Hemoglobin: 7 g/dL — ABNORMAL LOW (ref 12.0–15.0)
WBC: 12.6 10*3/uL — ABNORMAL HIGH (ref 4.0–10.5)

## 2011-07-02 MED ORDER — PRO-STAT SUGAR FREE PO LIQD
30.0000 mL | Freq: Two times a day (BID) | ORAL | Status: DC
  Administered 2011-07-02 – 2011-07-11 (×17): 30 mL via ORAL
  Filled 2011-07-02 (×24): qty 30

## 2011-07-02 MED ORDER — ACETAMINOPHEN 80 MG/0.8ML PO SUSP
1000.0000 mg | Freq: Four times a day (QID) | ORAL | Status: DC | PRN
  Filled 2011-07-02: qty 15

## 2011-07-02 MED ORDER — ACETAMINOPHEN 160 MG/5ML PO SUSP
1000.0000 mg | Freq: Four times a day (QID) | ORAL | Status: DC | PRN
  Administered 2011-07-02 – 2011-07-08 (×6): 1000 mg via ORAL
  Filled 2011-07-02 (×6): qty 40.6

## 2011-07-02 MED ORDER — MAGNESIUM SULFATE 40 MG/ML IJ SOLN
INTRAMUSCULAR | Status: AC
  Filled 2011-07-02: qty 100

## 2011-07-02 NOTE — Progress Notes (Signed)
Increased sedation

## 2011-07-02 NOTE — Progress Notes (Signed)
Pt did not tolerate SBT this am due to increased RR to around 65 and decreased Spontaneous Vt. Rt then attempted pt on 40% 5/10.

## 2011-07-02 NOTE — Progress Notes (Signed)
3 Days Post-Op  Subjective: On vent.  Eyes open.    Objective: Vital signs in last 24 hours: Temp:  [99.6 F (37.6 C)-101.1 F (38.4 C)] 101 F (38.3 C) (06/02 0811) Pulse Rate:  [117-138] 127  (06/02 0800) Resp:  [18-33] 24  (06/02 0800) BP: (92-131)/(48-71) 131/70 mmHg (06/02 0800) SpO2:  [97 %-100 %] 99 % (06/02 0800) FiO2 (%):  [39.9 %-40.3 %] 40.2 % (06/02 0800) Weight:  [247 lb 5.7 oz (112.2 kg)] 247 lb 5.7 oz (112.2 kg) (06/02 0431)   Vent Mode:  [-] PRVC FiO2 (%):  [39.9 %-40.3 %] 40.2 % Set Rate:  [18 bmp] 18 bmp Vt Set:  [550 mL] 550 mL PEEP:  [5 cmH20] 5 cmH20 Pressure Support:  [5 cmH20-10 cmH20] 10 cmH20 Plateau Pressure:  [11 cmH20-15 cmH20] 15 cmH20 Intake/Output from previous day: 06/01 0701 - 06/02 0700 In: 3480.3 [I.V.:1812; NG/GT:1250; IV Piggyback:418.3] Out: 3225 [Urine:3225] Intake/Output this shift: Total I/O In: 112 [I.V.:62; IV Piggyback:50] Out: -   General appearance: eyes open Cardio: tachycardia   NSR GI: soft, non-tender; bowel sounds normal; no masses,  no organomegaly Incision/Wound:RLE vac in place.  LLE ex fix    Lab Results:   Basename 07/02/11 0455 07/01/11 0430  WBC 12.6* 12.6*  HGB 7.0* 7.0*  HCT 21.3* 20.6*  PLT 168 144*   BMET  Basename 07/02/11 0455 07/01/11 0430  NA 134* 136  K 3.4* 3.3*  CL 97 100  CO2 29 27  GLUCOSE 125* 129*  BUN 18 11  CREATININE 0.98 0.97  CALCIUM 7.4* 7.3*   PT/INR No results found for this basename: LABPROT:2,INR:2 in the last 72 hours ABG  Basename 06/30/11 0500  PHART 7.408*  HCO3 24.1*    Studies/Results: Dg Chest Port 1 View  07/01/2011  *RADIOLOGY REPORT*  Clinical Data: Intubated patient.  Pleural effusion.  PORTABLE CHEST - 1 VIEW  Comparison: Chest 06/30/2011.  Findings: Support tubes and lines are unchanged.  Left effusion and airspace disease have markedly improved.  Right lung is clear. Heart size normal.  IMPRESSION: Marked improvement left effusion and airspace disease.   Original Report Authenticated By: Bernadene Bell. Maricela Curet, M.D.    Anti-infectives: Anti-infectives     Start     Dose/Rate Route Frequency Ordered Stop   06/27/11 1400   ceFAZolin (ANCEF) IVPB 1 g/50 mL premix        1 g 100 mL/hr over 30 Minutes Intravenous 3 times per day 06/27/11 1056     06/27/11 1300   gentamicin (GARAMYCIN) 530 mg in dextrose 5 % 100 mL IVPB        530 mg 113.3 mL/hr over 60 Minutes Intravenous Every 24 hours 06/27/11 1214            Assessment/Plan: s/p Procedure(s) (LRB): AMPUTATION ABOVE KNEE (Right) IRRIGATION AND DEBRIDEMENT EXTREMITY (Right) VDRF  CONT  To wean vent.  Hopefully another SBT today.  CXR pending. FEN  On TF BLE injuries per ortho  LOS: 5 days    Danyia Borunda A. 07/02/2011

## 2011-07-02 NOTE — Progress Notes (Signed)
Pt placed back on full support due to increased RR to around 65 and increased HR. Pt seeming more agitated, continues to cough and gag on ET tube. RT will continue to monitor.

## 2011-07-02 NOTE — Progress Notes (Signed)
ANTIBIOTIC CONSULT NOTE - FOLLOW UP  Pharmacy Consult for Gentamicin Indication: Empiric coverage s/p open L-tib-fib fracture  No Known Allergies  Patient Measurements: Height: 5\' 3"  (160 cm) Weight: 247 lb 5.7 oz (112.2 kg) IBW/kg (Calculated) : 52.4  Adjusted Body Weight:   Vital Signs: Temp: 100.8 F (38.2 C) (06/02 1137) Temp src: Axillary (06/02 1137) BP: 128/62 mmHg (06/02 1200) Pulse Rate: 126  (06/02 1200) Intake/Output from previous day: 06/01 0701 - 06/02 0700 In: 3480.3 [I.V.:1812; NG/GT:1250; IV Piggyback:418.3] Out: 3225 [Urine:3225] Intake/Output from this shift: Total I/O In: 768.3 [I.V.:388.3; NG/GT:330; IV Piggyback:50] Out: 475 [Urine:475]  Labs:  [facility] 07/02/11 0455 07/01/11 0430 06/30/11 0446  WBC 12.6* 12.6* 8.8  HGB 7.0* 7.0* 6.3*  PLT 168 144* 104*  LABCREA -- -- --  CREATININE 0.98 0.97 0.94   Estimated Creatinine Clearance: 105.7 ml/min (by C-G formula based on Cr of 0.98). No results found for this basename: VANCOTROUGH:2,VANCOPEAK:2,VANCORANDOM:2,GENTTROUGH:2,GENTPEAK:2,GENTRANDOM:2,TOBRATROUGH:2,TOBRAPEAK:2,TOBRARND:2,AMIKACINPEAK:2,AMIKACINTROU:2,AMIKACIN:2, in the last 72 hours   Microbiology: Recent Results (from the past 720 hour(s))  MRSA PCR SCREENING     Status: Normal   Collection Time   06/27/11  4:45 AM      Component Value Range Status Comment   MRSA by PCR NEGATIVE  NEGATIVE  Final   CULTURE, RESPIRATORY     Status: Normal   Collection Time   06/30/11  8:18 AM      Component Value Range Status Comment   Specimen Description ENDOTRACHEAL ASPIRATE   Final    Special Requests NONE   Final    Gram Stain     Final    Value: RARE WBC PRESENT,BOTH PMN AND MONONUCLEAR     NO SQUAMOUS EPITHELIAL CELLS SEEN     NO ORGANISMS SEEN   Culture NO GROWTH 2 DAYS   Final    Report Status 07/02/2011 FINAL   Final   URINE CULTURE     Status: Normal   Collection Time   06/30/11  9:45 AM      Component Value Range Status Comment   Specimen Description URINE, CATHETERIZED   Final    Special Requests NONE   Final    Culture  Setup Time [id number]   Final    Colony Count NO GROWTH   Final    Culture NO GROWTH   Final    Report Status 07/01/2011 FINAL   Final   CULTURE, BLOOD (ROUTINE X 2)     Status: Normal (Preliminary result)   Collection Time   06/30/11 10:30 AM      Component Value Range Status Comment   Specimen Description BLOOD ARM LEFT   Final    Special Requests BOTTLES DRAWN AEROBIC ONLY 8.0CC ONLY    Final    Culture  Setup Time [id number]   Final    Culture     Final    Value:        BLOOD CULTURE RECEIVED NO GROWTH TO DATE CULTURE WILL BE HELD FOR 5 DAYS BEFORE ISSUING A FINAL NEGATIVE REPORT   Report Status PENDING   Incomplete     Anti-infectives     Start     Dose/Rate Route Frequency Ordered Stop   06/27/11 1400   ceFAZolin (ANCEF) IVPB 1 g/50 mL premix        1 g 100 mL/hr over 30 Minutes Intravenous 3 times per day 06/27/11 1056     06/27/11 1300   gentamicin (GARAMYCIN) 530 mg in dextrose 5 % 100 mL IVPB  530 mg 113.3 mL/hr over 60 Minutes Intravenous Every 24 hours 06/27/11 1214            Assessment: 26yo female on day#6 of empiric antibiotic coverage.  Cr has been stable and patient with good urine output.  Sputum & urine cx are negative, blood cx are NTD.  Pt with continued temp to 101.  Goal of Therapy:  Gentamicin trough level <2 mcg/ml  Plan:  1.  Gentamicin level on Monday night if to continue  [name], PharmD Clinical Pharmacist [facility]

## 2011-07-02 NOTE — Progress Notes (Signed)
Tylenol 1000 mg given via tube for temp of 101.9.  Will continue to monitor.

## 2011-07-02 NOTE — Significant Event (Signed)
0825am-Received a call from Radiology, regarding pt lastest x-ray, with ETT in proximal right main stem  bronchus and needed to pull back 3cm. Spoken with MD Cornett. New order to retract ETT 1cm. Will alert respiratory and monitor. Cleopatra Sardo, Charity fundraiser.

## 2011-07-02 NOTE — Consult Note (Signed)
Reason for Consult:Left index finger metacarpal fracture Referring Physician: trauma servcie  Tonya [name] is an 26 y.o. female.  HPI: chart reviewed which thoroughly documents the history I was asked to see patient for her broken left hand   History reviewed. No pertinent past medical history.  Past Surgical History  Procedure Date  . Amputation 06/29/2011    Procedure: AMPUTATION ABOVE KNEE;  Surgeon: Budd Palmer, MD;  Location: [facility] OR;  Service: Orthopedics;  Laterality: Right;  abovve knee amputation of right leg  . I&d extremity 06/29/2011    Procedure: IRRIGATION AND DEBRIDEMENT EXTREMITY;  Surgeon: Budd Palmer, MD;  Location: [facility] OR;  Service: Orthopedics;  Laterality: Right;  Irrigation and debridement of right forearm with tendon repair.    History reviewed. No pertinent family history.  Social History:  does not have a smoking history on file. She does not have any smokeless tobacco history on file. Her alcohol and drug histories not on file.  Allergies: No Known Allergies  Medications: I have reviewed the patient's current medications.  Results for orders placed during the hospital encounter of 06/27/11 (from the past 48 hour(s))  GLUCOSE, CAPILLARY     Status: Abnormal   Collection Time   06/30/11  3:26 PM      Component Value Range Comment   Glucose-Capillary 133 (*) 70 - 99 (mg/dL)   GLUCOSE, CAPILLARY     Status: Abnormal   Collection Time   06/30/11  7:58 PM      Component Value Range Comment   Glucose-Capillary 106 (*) 70 - 99 (mg/dL)   GLUCOSE, CAPILLARY     Status: Abnormal   Collection Time   07/01/11 12:50 AM      Component Value Range Comment   Glucose-Capillary 141 (*) 70 - 99 (mg/dL)   CBC     Status: Abnormal   Collection Time   07/01/11  4:30 AM      Component Value Range Comment   WBC 12.6 (*) 4.0 - 10.5 (K/uL)    RBC 2.38 (*) 3.87 - 5.11 (MIL/uL)    Hemoglobin 7.0 (*) 12.0 - 15.0 (g/dL)    HCT 16.1 (*) 09.6 - 46.0 (%)    MCV 86.6   78.0 - 100.0 (fL)    MCH 29.4  26.0 - 34.0 (pg)    MCHC 34.0  30.0 - 36.0 (g/dL)    RDW 04.5  40.9 - 81.1 (%)    Platelets 144 (*) 150 - 400 (K/uL)   BASIC METABOLIC PANEL     Status: Abnormal   Collection Time   07/01/11  4:30 AM      Component Value Range Comment   Sodium 136  135 - 145 (mEq/L)    Potassium 3.3 (*) 3.5 - 5.1 (mEq/L)    Chloride 100  96 - 112 (mEq/L)    CO2 27  19 - 32 (mEq/L)    Glucose, Bld 129 (*) 70 - 99 (mg/dL)    BUN 11  6 - 23 (mg/dL)    Creatinine, Ser 9.14  0.50 - 1.10 (mg/dL)    Calcium 7.3 (*) 8.4 - 10.5 (mg/dL)    GFR calc non Af Amer 81 (*) >90 (mL/min)    GFR calc Af Amer >90  >90 (mL/min)   GLUCOSE, CAPILLARY     Status: Abnormal   Collection Time   07/01/11  5:38 AM      Component Value Range Comment   Glucose-Capillary 125 (*) 70 - 99 (mg/dL)  GLUCOSE, CAPILLARY     Status: Abnormal   Collection Time   07/01/11  7:20 AM      Component Value Range Comment   Glucose-Capillary 148 (*) 70 - 99 (mg/dL)    Comment 1 Notify RN     GLUCOSE, CAPILLARY     Status: Abnormal   Collection Time   07/01/11 11:49 AM      Component Value Range Comment   Glucose-Capillary 135 (*) 70 - 99 (mg/dL)    Comment 1 Notify RN     GLUCOSE, CAPILLARY     Status: Abnormal   Collection Time   07/01/11  3:21 PM      Component Value Range Comment   Glucose-Capillary 127 (*) 70 - 99 (mg/dL)   GLUCOSE, CAPILLARY     Status: Abnormal   Collection Time   07/01/11  7:38 PM      Component Value Range Comment   Glucose-Capillary 130 (*) 70 - 99 (mg/dL)   GLUCOSE, CAPILLARY     Status: Abnormal   Collection Time   07/01/11 11:41 PM      Component Value Range Comment   Glucose-Capillary 131 (*) 70 - 99 (mg/dL)   GLUCOSE, CAPILLARY     Status: Abnormal   Collection Time   07/02/11  4:28 AM      Component Value Range Comment   Glucose-Capillary 129 (*) 70 - 99 (mg/dL)   BASIC METABOLIC PANEL     Status: Abnormal   Collection Time   07/02/11  4:55 AM      Component Value Range  Comment   Sodium 134 (*) 135 - 145 (mEq/L)    Potassium 3.4 (*) 3.5 - 5.1 (mEq/L)    Chloride 97  96 - 112 (mEq/L)    CO2 29  19 - 32 (mEq/L)    Glucose, Bld 125 (*) 70 - 99 (mg/dL)    BUN 18  6 - 23 (mg/dL)    Creatinine, Ser 1.61  0.50 - 1.10 (mg/dL)    Calcium 7.4 (*) 8.4 - 10.5 (mg/dL)    GFR calc non Af Amer 80 (*) >90 (mL/min)    GFR calc Af Amer >90  >90 (mL/min)   CBC     Status: Abnormal   Collection Time   07/02/11  4:55 AM      Component Value Range Comment   WBC 12.6 (*) 4.0 - 10.5 (K/uL)    RBC 2.41 (*) 3.87 - 5.11 (MIL/uL)    Hemoglobin 7.0 (*) 12.0 - 15.0 (g/dL)    HCT 09.6 (*) 04.5 - 46.0 (%)    MCV 88.4  78.0 - 100.0 (fL)    MCH 29.0  26.0 - 34.0 (pg)    MCHC 32.9  30.0 - 36.0 (g/dL)    RDW 40.9  81.1 - 91.4 (%)    Platelets 168  150 - 400 (K/uL)   GLUCOSE, CAPILLARY     Status: Abnormal   Collection Time   07/02/11  8:06 AM      Component Value Range Comment   Glucose-Capillary 109 (*) 70 - 99 (mg/dL)    Comment 1 Notify RN     GLUCOSE, CAPILLARY     Status: Abnormal   Collection Time   07/02/11 11:36 AM      Component Value Range Comment   Glucose-Capillary 146 (*) 70 - 99 (mg/dL)    Comment 1 Notify RN       Dg Chest Port 1 View  07/02/2011  *RADIOLOGY  REPORT*  Clinical Data: Respiratory failure, ventilatory support  PORTABLE CHEST - 1 VIEW  Comparison: 07/01/2011  Findings: Endotracheal tube is within the proximal right main stem bronchus and should be retracted 3 cm.  Other support apparatus stable.  Low lung volumes persist with patchy basilar atelectasis. Stable heart size.  No effusion or pneumothorax.  IMPRESSION: Right mainstem intubation.  Retract 3 cm.  Findings called to [facility], the patients nurse on 2300  Original Report Authenticated By: M. Ruel Favors, M.D.   Dg Chest Port 1 View  07/01/2011  *RADIOLOGY REPORT*  Clinical Data: Intubated patient.  Pleural effusion.  PORTABLE CHEST - 1 VIEW  Comparison: Chest 06/30/2011.  Findings: Support tubes and  lines are unchanged.  Left effusion and airspace disease have markedly improved.  Right lung is clear. Heart size normal.  IMPRESSION: Marked improvement left effusion and airspace disease.  Original Report Authenticated By: Bernadene Bell. Maricela Curet, M.D.    See chart for recent status  Blood pressure 128/62, pulse 126, temperature 100.8 F (38.2 C), temperature source Axillary, resp. rate 18, height 5\' 3"  (1.6 m), weight 112.2 kg (247 lb 5.7 oz), last menstrual period 06/23/2011, SpO2 99.00%. Pt intubated/sedated Not responsive to commands Left hand in volar splint Finger tips perfused  Radiographs: show min displaced index finger metacarpal neck fracture  Assessment/Plan: Left index finger metacarpal neck fracture  Recommend: Continued closed treatment Continue with current splint Will need to re-xray in 10-14 days out of splint Elevate hand if possible Contact me should any questions,concerns arise  Konor Noren W 07/02/2011, 1:38 PM

## 2011-07-03 ENCOUNTER — Inpatient Hospital Stay (HOSPITAL_COMMUNITY): Payer: Medicare Other

## 2011-07-03 LAB — BASIC METABOLIC PANEL
BUN: 20 mg/dL (ref 6–23)
CO2: 28 mEq/L (ref 19–32)
Calcium: 7.6 mg/dL — ABNORMAL LOW (ref 8.4–10.5)
Chloride: 100 mEq/L (ref 96–112)
Creatinine, Ser: 0.83 mg/dL (ref 0.50–1.10)
GFR calc Af Amer: >90 mL/min (ref >90)
GFR calc non Af Amer: >90 mL/min (ref >90)
Glucose, Bld: 136 mg/dL — ABNORMAL HIGH (ref 70–99)
Potassium: 3.7 mEq/L (ref 3.5–5.1)
Sodium: 137 mEq/L (ref 135–145)

## 2011-07-03 LAB — DIFFERENTIAL
Basophils Absolute: 0.1 10*3/uL (ref 0.0–0.1)
Basophils Relative: 1 % (ref 0–1)
Eosinophils Absolute: 0.7 10*3/uL (ref 0.0–0.7)
Eosinophils Relative: 5 % (ref 0–5)
Lymphocytes Relative: 17 % (ref 12–46)
Lymphs Abs: 2.4 10*3/uL (ref 0.7–4.0)
Monocytes Absolute: 1.6 10*3/uL — ABNORMAL HIGH (ref 0.1–1.0)
Monocytes Relative: 11 % (ref 3–12)
Neutro Abs: 9.6 10*3/uL — ABNORMAL HIGH (ref 1.7–7.7)
Neutrophils Relative %: 66 % (ref 43–77)

## 2011-07-03 LAB — CBC
HCT: 20 % — ABNORMAL LOW (ref 36.0–46.0)
Hemoglobin: 6.4 g/dL — CL (ref 12.0–15.0)
MCH: 28.8 pg (ref 26.0–34.0)
MCHC: 32 g/dL (ref 30.0–36.0)
MCV: 90.1 fL (ref 78.0–100.0)
Platelets: 231 10*3/uL (ref 150–400)
RBC: 2.22 MIL/uL — ABNORMAL LOW (ref 3.87–5.11)
RDW: 15 % (ref 11.5–15.5)
WBC: 14.4 10*3/uL — ABNORMAL HIGH (ref 4.0–10.5)

## 2011-07-03 LAB — URINALYSIS, MICROSCOPIC ONLY
Glucose, UA: NEGATIVE mg/dL
Ketones, ur: NEGATIVE mg/dL
Specific Gravity, Urine: 1.034 — ABNORMAL HIGH (ref 1.005–1.030)
pH: 5.5 (ref 5.0–8.0)

## 2011-07-03 MED ORDER — DOCUSATE SODIUM 50 MG/5ML PO LIQD
50.0000 mg | Freq: Two times a day (BID) | ORAL | Status: DC
  Administered 2011-07-03 – 2011-07-05 (×6): 50 mg via ORAL
  Administered 2011-07-06: 10:00:00 via ORAL
  Administered 2011-07-06 – 2011-07-09 (×7): 50 mg via ORAL
  Filled 2011-07-03 (×25): qty 10

## 2011-07-03 MED ORDER — BISACODYL 10 MG RE SUPP
10.0000 mg | Freq: Every day | RECTAL | Status: DC
  Administered 2011-07-03 – 2011-07-16 (×9): 10 mg via RECTAL
  Filled 2011-07-03 (×9): qty 1

## 2011-07-03 MED ORDER — DEXMEDETOMIDINE HCL 100 MCG/ML IV SOLN
0.4000 ug/kg/h | INTRAVENOUS | Status: DC
  Administered 2011-07-03: 0.4 ug/kg/h via INTRAVENOUS
  Administered 2011-07-03: 0.7 ug/kg/h via INTRAVENOUS
  Filled 2011-07-03 (×4): qty 2

## 2011-07-03 MED ORDER — SODIUM CHLORIDE 0.9 % IV SOLN
0.4000 ug/kg/h | INTRAVENOUS | Status: DC
  Administered 2011-07-03: 0.7 ug/kg/h via INTRAVENOUS
  Administered 2011-07-04: 1.2 ug/kg/h via INTRAVENOUS
  Administered 2011-07-04: 1.1 ug/kg/h via INTRAVENOUS
  Administered 2011-07-04: 1.2 ug/kg/h via INTRAVENOUS
  Administered 2011-07-04 (×2): 1.1 ug/kg/h via INTRAVENOUS
  Filled 2011-07-03 (×8): qty 4

## 2011-07-03 NOTE — Progress Notes (Signed)
Agree with above. 30 min critical care time.  Add precedex to attempt to help with extubation.
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
## 2011-07-03 NOTE — Clinical Social Work Note (Signed)
Clinical Social Worker spoke with patient mother and brother to offer continued support in 2300 waiting area.  Patient mother states that there has been constant family present for the weekend and that she has had valuable family support.  Patient mother is attempting to arrange patient finances and is requesting a letterhead document for social security to hold patient disability checks.  CSW provided patient mother and brother with food vouchers due to conversation about "hard times."  Patient mother continues her appreciation for social work involvement and kindness of 2300 staff to help support during a difficult time.    Clinical Social Worker notified CM who will get MD signature and provide patient mother with letterhead document.  CSW remains available for support and discharge planning needs.  Jesse Alfreda Hammad, LCSWA [phone]  

## 2011-07-03 NOTE — Clinical Social Work Note (Signed)
Clinical Social Worker spoke with patient mother and brother to offer continued support in 2300 waiting area.  Patient mother states that there has been constant family present for the weekend and that she has had valuable family support.  Patient mother is attempting to arrange patient finances and is requesting a letterhead document for social security to hold patient disability checks.  CSW provided patient mother and brother with food vouchers due to conversation about "hard times."  Patient mother continues her appreciation for social work involvement and kindness of 2300 staff to help support during a difficult time.    Clinical Social Worker notified CM who will get MD signature and provide patient mother with letterhead document.  CSW remains available for support and discharge planning needs.  [address] [place], [place] [phone]

## 2011-07-03 NOTE — Progress Notes (Signed)
Pt placed back on full support at this time. Pt obviously in a lot of pain, pt frustrated and trying to pull out ET tube. Pt's RR went into the 70's during this time. Pt calm at this time, after more sedation was given. Pt tolerated 5/15 all day until this point. RT will continue to monitor.

## 2011-07-03 NOTE — Progress Notes (Signed)
10 ml versed wasted and verified by Sharyl Nimrod, RN

## 2011-07-03 NOTE — Progress Notes (Signed)
Pt failed SBT due to increased RR. Pt then trialed on 40% 5/8 and again did not tolerate due to increased RR. Pt now on 40% 5/15 and is tolerating very well. Vitals are WNL, pt maintaining RR of around 23-24 and is in no obvious distress. RT will continue to monitor.
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
## 2011-07-03 NOTE — Progress Notes (Signed)
Temp reassessed after medication

## 2011-07-03 NOTE — Procedures (Signed)
Mini BAL Procedure Note Tonya [name] [id number] 06/06/1985  Procedure: Mini Bronchial Alveolar Lavage  Procedure Details: In preparation for procedure, Patient hyper-oxygenated with 100 % FiO2 and Saline given via ETT (40 ml) Sterile Technique used:gloves, gown and mask Amount of Saline administered: 40 (ml) Specimen amount collected: 5 (ml)  Evaluation: BP 110/53  Pulse 116  Temp(Src) 99.9 F (37.7 C) (Oral)  Resp 23  Ht 5\' 3"  (1.6 m)  Wt 246 lb 11.1 oz (111.9 kg)  BMI 43.70 kg/m2  SpO2 98%  LMP 06/23/2011 O2 sats: stable throughout Breath Sounds: Clear Patient's Current Condition: stable Complications: No apparent complications Patient did tolerate procedure well.   Parke Poisson 07/03/2011, 10:23 AM
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
## 2011-07-04 ENCOUNTER — Inpatient Hospital Stay (HOSPITAL_COMMUNITY): Payer: Medicare Other

## 2011-07-04 ENCOUNTER — Encounter (HOSPITAL_COMMUNITY): Payer: Self-pay | Admitting: Orthopedic Surgery

## 2011-07-04 LAB — DIFFERENTIAL
Basophils Absolute: 0.1 10*3/uL (ref 0.0–0.1)
Basophils Relative: 1 % (ref 0–1)
Eosinophils Absolute: 0.7 10*3/uL (ref 0.0–0.7)
Eosinophils Relative: 5 % (ref 0–5)
Lymphocytes Relative: 21 % (ref 12–46)
Lymphs Abs: 3.1 10*3/uL (ref 0.7–4.0)
Monocytes Absolute: 1.3 10*3/uL — ABNORMAL HIGH (ref 0.1–1.0)
Monocytes Relative: 9 % (ref 3–12)
Neutro Abs: 9.6 10*3/uL — ABNORMAL HIGH (ref 1.7–7.7)
Neutrophils Relative %: 64 % (ref 43–77)

## 2011-07-04 LAB — CBC
HCT: 19 % — ABNORMAL LOW (ref 36.0–46.0)
Hemoglobin: 6.2 g/dL — CL (ref 12.0–15.0)
MCH: 29.5 pg (ref 26.0–34.0)
MCHC: 32.6 g/dL (ref 30.0–36.0)
MCV: 90.5 fL (ref 78.0–100.0)
Platelets: 270 10*3/uL (ref 150–400)
RBC: 2.1 MIL/uL — ABNORMAL LOW (ref 3.87–5.11)
RDW: 14.9 % (ref 11.5–15.5)
WBC: 14.9 10*3/uL — ABNORMAL HIGH (ref 4.0–10.5)

## 2011-07-04 LAB — GLUCOSE, CAPILLARY
Glucose-Capillary: 120 mg/dL — ABNORMAL HIGH (ref 70–99)
Glucose-Capillary: 152 mg/dL — ABNORMAL HIGH (ref 70–99)
Glucose-Capillary: 124 mg/dL — ABNORMAL HIGH (ref 70–99)
Glucose-Capillary: 155 mg/dL — ABNORMAL HIGH (ref 70–99)
Glucose-Capillary: 137 mg/dL — ABNORMAL HIGH (ref 70–99)

## 2011-07-04 LAB — BASIC METABOLIC PANEL
BUN: 24 mg/dL — ABNORMAL HIGH (ref 6–23)
CO2: 27 mEq/L (ref 19–32)
Calcium: 7.7 mg/dL — ABNORMAL LOW (ref 8.4–10.5)
Chloride: 102 mEq/L (ref 96–112)
Creatinine, Ser: 0.89 mg/dL (ref 0.50–1.10)
GFR calc Af Amer: >90 mL/min (ref >90)
GFR calc non Af Amer: 89 mL/min — ABNORMAL LOW (ref >90)
Glucose, Bld: 140 mg/dL — ABNORMAL HIGH (ref 70–99)
Potassium: 4.2 mEq/L (ref 3.5–5.1)
Sodium: 137 mEq/L (ref 135–145)

## 2011-07-04 MED ORDER — VANCOMYCIN HCL 1000 MG IV SOLR
1250.0000 mg | Freq: Three times a day (TID) | INTRAVENOUS | Status: DC
  Administered 2011-07-04 – 2011-07-05 (×5): 1250 mg via INTRAVENOUS
  Filled 2011-07-04 (×8): qty 1250

## 2011-07-04 MED ORDER — SODIUM CHLORIDE 0.9 % IV SOLN: 1.0000 mg/h | INTRAVENOUS | Status: DC

## 2011-07-04 MED ORDER — BACITRACIN-NEOMYCIN-POLYMYXIN OINTMENT TUBE
TOPICAL_OINTMENT | Freq: Every day | CUTANEOUS | Status: DC
  Administered 2011-07-04 – 2011-07-08 (×5): via TOPICAL
  Administered 2011-07-09: 1 via TOPICAL
  Administered 2011-07-10 – 2011-07-17 (×6): via TOPICAL
  Filled 2011-07-04: qty 15

## 2011-07-04 MED ORDER — SODIUM CHLORIDE 0.9 % IJ SOLN
10.0000 mL | INTRAMUSCULAR | Status: DC | PRN
  Administered 2011-07-06: 10 mL
  Administered 2011-07-13: 20 mL
  Administered 2011-07-13: 30 mL
  Administered 2011-07-14: 20 mL

## 2011-07-04 MED ORDER — PIPERACILLIN-TAZOBACTAM 3.375 G IVPB
3.3750 g | Freq: Three times a day (TID) | INTRAVENOUS | Status: DC
  Administered 2011-07-04 – 2011-07-10 (×19): 3.375 g via INTRAVENOUS
  Filled 2011-07-04 (×22): qty 50

## 2011-07-04 MED ORDER — SODIUM CHLORIDE 0.9 % IV SOLN
1.0000 mg/h | INTRAVENOUS | Status: DC
  Administered 2011-07-04: 5 mg/h via INTRAVENOUS
  Administered 2011-07-05 – 2011-07-06 (×2): 6 mg/h via INTRAVENOUS
  Administered 2011-07-06: 4 mg/h via INTRAVENOUS
  Administered 2011-07-07: 5 mg/h via INTRAVENOUS
  Administered 2011-07-07 – 2011-07-08 (×2): 4 mg/h via INTRAVENOUS
  Administered 2011-07-09 – 2011-07-10 (×4): 6 mg/h via INTRAVENOUS
  Filled 2011-07-04 (×18): qty 10

## 2011-07-04 MED ORDER — MIDAZOLAM HCL 2 MG/2ML IJ SOLN
5.0000 mg | Freq: Once | INTRAMUSCULAR | Status: AC
  Administered 2011-07-04: 5 mg via INTRAVENOUS

## 2011-07-04 MED ORDER — MIDAZOLAM HCL 2 MG/2ML IJ SOLN
INTRAMUSCULAR | Status: AC
  Administered 2011-07-04: 5 mg via INTRAVENOUS
  Filled 2011-07-04: qty 6

## 2011-07-04 MED ORDER — SODIUM CHLORIDE 0.9 % IJ SOLN
10.0000 mL | Freq: Two times a day (BID) | INTRAMUSCULAR | Status: DC
  Administered 2011-07-04 – 2011-07-05 (×2): 30 mL
  Administered 2011-07-05 – 2011-07-08 (×6): 10 mL
  Administered 2011-07-08 – 2011-07-09 (×2): 40 mL
  Administered 2011-07-09: 10 mL
  Administered 2011-07-10: 20 mL
  Administered 2011-07-10: 40 mL
  Administered 2011-07-11 – 2011-07-12 (×4): 20 mL
  Administered 2011-07-13: 10 mL
  Administered 2011-07-13: 40 mL
  Administered 2011-07-16: 10:00:00
  Filled 2011-07-04: qty 40
  Filled 2011-07-04 (×3): qty 20
  Filled 2011-07-04: qty 30
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
## 2011-07-04 NOTE — Plan of Care (Signed)
Problem: Phase I Progression Outcomes Goal: Pain controlled with appropriate interventions Outcome: Progressing On continuous iv fentanyl with prn boluses Goal: Hemodynamically stable Outcome: Progressing sbp 90-100's Goal: Patient tolerating nututrition at goal Outcome: Completed/Met Date Met:  07/04/11 Pivot 1.5 at 35 with supplemental protein Goal: Patient tolerating weaning plan Outcome: Progressing On ps of 15 today x approx 6 hours Goal: Optimized method of communication. Outcome: Progressing Nods appropriately

## 2011-07-04 NOTE — Progress Notes (Signed)
Pt began to desat, rn called for rt assessment. rn had sux pt with white frothy secretions. Rt increased fio2 to 60 at this time..new to my practice sat 92-94% rn calling for a new cxray
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
## 2011-07-04 NOTE — Plan of Care (Signed)
Problem: Phase I Progression Outcomes Goal: Pain controlled with appropriate interventions Outcome: Progressing On continuous fentanyl drip Goal: Neurovascular status within defined limits Outcome: Progressing Cap refill less than 3 sec. No signs sx of infections. Moderate femoral pulse
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
## 2011-07-04 NOTE — Progress Notes (Signed)
Occupational Therapy Treatment Patient Details Name: Tonya [name] MRN: [id number] DOB: 01-20-1986 Today's Date: 07/04/2011 Time: 2725-3664 OT Time Calculation (min): 9 min  OT Assessment / Plan / Recommendation Comments on Treatment Session Foot plate seems to be fitting well, with no signs of skin breakdown.  Nursing is independent with monitoring foot plate.  Will discharge OTq    Follow Up Recommendations       Barriers to Discharge       Equipment Recommendations       Recommendations for Other Services    Frequency     Plan All goals met and education completed, patient discharged from OT services    Precautions / Restrictions Precautions Precautions: Other (comment) Precaution Comments: OT ordered for foot plate Restrictions Weight Bearing Restrictions: Yes       ADL  ADL Comments: Seen for foot plate check.  Spoke with RN who reports she checked foot and no signs of skin integrity issues.  Foot plate removed, and skin inspected - no issues.  No futher OT needsq    OT Diagnosis:    OT Problem List:   OT Treatment Interventions:     OT Goals ADL Goals ADL Goal: Additional Goal #1 - Progress: Met  Visit Information  Last OT Received On: 07/04/11    Subjective Data      Prior Functioning       Cognition  Overall Cognitive Status:  (sedated) Arousal/Alertness: Other (comment) (sedated) Cognition - Other Comments: pt. sedated    Mobility     Exercises    Balance    End of Session OT - End of Session Nurse Communication: Other (comment) (Nurse indpendently monitoring foot plate)   Sammie Denner M 07/04/2011, 2:49 PM

## 2011-07-04 NOTE — Progress Notes (Signed)
pts tube not moved bc she now has a bite block in

## 2011-07-04 NOTE — Plan of Care (Signed)
Problem: Problem: Mobility Progression Goal: INCREASED MOBILITY OR STRENGTH Outcome: Progressing Moving arms some trying to extubate self.
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
## 2011-07-04 NOTE — Progress Notes (Signed)
40cc Precedex wasted in sink. Witnessed by [employer]. Thresa Ross RN

## 2011-07-04 NOTE — Progress Notes (Signed)
Orthopedic Tech Progress Note Patient Details:  Tyechia Noel Henandez Jul 27, 1985 [id number]  Patient ID: Natalye Chelsea Primus, female   DOB: 10-Apr-1985, 26 y.o.   MRN: [id number] Brace order completed by advanced  Nikki Dom 07/04/2011, 2:21 PM
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
## 2011-07-04 NOTE — Progress Notes (Signed)
CRITICAL VALUE ALERT  Critical value received:  Gentamicin trough 4.2  Date of notification:  07/04/11  Time of notification:  0240  Critical value read back: yes  Nurse who received alert:  A. Lamar Benes RN  MD notified (1st page):  Pharmacy notified- Len Childs at [phone]   Time of first page:    MD notified (2nd page):  Time of second page:  Responding MD:    Time MD responded:

## 2011-07-04 NOTE — Progress Notes (Signed)
Patient ID: Tonya [name], female   DOB: 06/17/1985, 26 y.o.   MRN: [id number] Follow up - Trauma Critical Care  Patient Details:    Tonya [name] is an 26 y.o. female.  Lines/tubes : AIRWAYS 7.5 mm (Active)  Secured at (cm) 24 cm 07/03/2011  8:00 AM  Measured From Lips 07/03/2011  8:00 AM  Secured Location Center 07/03/2011  8:00 AM  Secured By [employer] 07/03/2011  8:00 AM  Site Condition Dry 07/03/2011  8:00 AM     Airway 7.5 mm (Active)  Secured at (cm) 24 cm 07/04/2011  4:11 AM  Measured From Lips 07/04/2011  4:11 AM  Secured Location Left 07/04/2011  4:11 AM  Secured By [employer] 07/04/2011  4:11 AM  Tube Holder Repositioned Yes 07/04/2011  4:11 AM  Cuff Pressure (cm H2O) 24 cm H2O 07/03/2011  4:04 PM  Site Condition Dry 07/04/2011  4:11 AM     CVC Triple Lumen 06/27/11 Left Subclavian (Active)  Site Assessment Clean;Dry;Intact 07/03/2011  9:00 PM  Proximal Lumen Status Capped (Central line) 07/03/2011  9:00 PM  Medial Capped (Central line) 07/03/2011  9:00 PM  Distal Lumen Status Infusing 07/03/2011  9:00 PM  Dressing Type Transparent 07/03/2011  9:00 PM  Dressing Status Clean;Dry;Intact;Antimicrobial disc in place 07/03/2011  9:00 PM  Line Care Connections checked and tightened 07/03/2011  9:00 PM  Dressing Intervention New dressing;Antimicrobial disc changed 06/28/2011 11:00 PM  Dressing Change Due 07/05/11 07/03/2011  9:00 PM  Indication for Insertion or Continuance of Line Limited venous access - need for IV therapy >5 days (PICC only) 07/03/2011  8:00 AM     NG/OG Tube Orogastric (Active)  Placement Verification Auscultation 07/03/2011  8:15 PM  Site Assessment Clean;Dry;Intact 07/03/2011  8:15 PM  Status Infusing tube feed 07/03/2011  8:15 PM  Drainage Appearance None 07/03/2011  8:00 AM  Gastric Residual 150 mL 07/04/2011  6:00 AM  Intake (mL) 35 mL 07/04/2011  6:00 AM  Output (mL) 7 mL 07/03/2011  4:00 PM     Urethral Catheter (Active)  Site Assessment Clean;Intact 07/03/2011   8:15 PM  Collection Container Standard drainage bag 07/03/2011  8:15 PM  Securement Method Leg strap 07/03/2011  8:15 PM  Urinary Catheter Interventions Unclamped 07/03/2011  8:00 AM  Indication for Insertion or Continuance of Catheter Urinary output monitoring;Physician order 07/03/2011  8:15 PM  Output (mL) 60 mL 07/04/2011  6:00 AM    Microbiology/Sepsis markers: Results for orders placed during the hospital encounter of 06/27/11  MRSA PCR SCREENING     Status: Normal   Collection Time   06/27/11  4:45 AM      Component Value Range Status Comment   MRSA by PCR NEGATIVE  NEGATIVE  Final   CULTURE, RESPIRATORY     Status: Normal   Collection Time   06/30/11  8:18 AM      Component Value Range Status Comment   Specimen Description ENDOTRACHEAL ASPIRATE   Final    Special Requests NONE   Final    Gram Stain     Final    Value: RARE WBC PRESENT,BOTH PMN AND MONONUCLEAR     NO SQUAMOUS EPITHELIAL CELLS SEEN     NO ORGANISMS SEEN   Culture NO GROWTH 2 DAYS   Final    Report Status 07/02/2011 FINAL   Final   URINE CULTURE     Status: Normal   Collection Time   06/30/11  9:45 AM  Component Value Range Status Comment   Specimen Description URINE, CATHETERIZED   Final    Special Requests NONE   Final    Culture  Setup Time [id number]   Final    Colony Count NO GROWTH   Final    Culture NO GROWTH   Final    Report Status 07/01/2011 FINAL   Final   CULTURE, BLOOD (ROUTINE X 2)     Status: Normal (Preliminary result)   Collection Time   06/30/11 10:30 AM      Component Value Range Status Comment   Specimen Description BLOOD ARM LEFT   Final    Special Requests BOTTLES DRAWN AEROBIC ONLY 8.0CC ONLY    Final    Culture  Setup Time [id number]   Final    Culture     Final    Value:        BLOOD CULTURE RECEIVED NO GROWTH TO DATE CULTURE WILL BE HELD FOR 5 DAYS BEFORE ISSUING A FINAL NEGATIVE REPORT   Report Status PENDING   Incomplete   CULTURE, BAL-QUANTITATIVE     Status: Normal  (Preliminary result)   Collection Time   07/03/11 10:20 AM      Component Value Range Status Comment   Specimen Description BRONCHIAL ALVEOLAR LAVAGE   Final    Special Requests NONE   Final    Gram Stain     Final    Value: FEW WBC PRESENT,BOTH PMN AND MONONUCLEAR     NO ORGANISMS SEEN     NO ORGANISMS SEEN   Colony Count PENDING   Incomplete    Culture PENDING   Incomplete    Report Status PENDING   Incomplete     Anti-infectives:  Anti-infectives     Start     Dose/Rate Route Frequency Ordered Stop   06/27/11 1400   ceFAZolin (ANCEF) IVPB 1 g/50 mL premix        1 g 100 mL/hr over 30 Minutes Intravenous 3 times per day 06/27/11 1056     06/27/11 1300   gentamicin (GARAMYCIN) 530 mg in dextrose 5 % 100 mL IVPB        530 mg 113.3 mL/hr over 60 Minutes Intravenous Every 24 hours 06/27/11 1214            Best Practice/Protocols:  VTE Prophylaxis: Lovenox (prophylaxtic dose) Continous Sedation  Consults: Treatment Team:  Budd Palmer, MD Sharma Covert, MD    Studies:   Dg Chest Port 1 View  27-Jul-2011  *RADIOLOGY REPORT*  Clinical Data: Respiratory failure.  PORTABLE CHEST - 1 VIEW  Comparison: 07/03/2011.  Findings: The support apparatus is stable.  Persistent low lung volumes with vascular crowding and areas of atelectasis.  IMPRESSION: Stable support apparatus. Persistent low lung volumes with vascular crowding and areas of atelectasis.  Original Report Authenticated By: P. Loralie Champagne, M.D.   Dg Chest Port 1 View  07/03/2011  *RADIOLOGY REPORT*  Clinical Data: Trauma and respiratory failure.  PORTABLE CHEST - 1 VIEW  Comparison: 07/01/2028  Findings: The endotracheal tube tip now lies approximately 1.5 cm above the carina.  Central line positioning stable.  No pneumothorax identified.  Mild lower lobe atelectasis present bilaterally.  No evidence of significant pleural effusion.  Heart size and mediastinal contours are stable.  IMPRESSION: Mild bilateral lower  lobe atelectasis.  Original Report Authenticated By: Reola Calkins, M.D.      Events:  Subjective:    Overnight Issues: febrile  Objective:  Vital signs for last 24 hours: Temp:  [99.9 F (37.7 C)-102.3 F (39.1 C)] 102.3 F (39.1 C) (06/04 0641) Pulse Rate:  [88-123] 92  (06/04 0700) Resp:  [17-38] 18  (06/04 0700) BP: (93-122)/(37-57) 102/48 mmHg (06/04 0700) SpO2:  [96 %-100 %] 100 % (06/04 0700) FiO2 (%):  [39.9 %-40.2 %] 40.2 % (06/04 0700) Weight:  [114.1 kg (251 lb 8.7 oz)] 114.1 kg (251 lb 8.7 oz) (06/04 0559)  Hemodynamic parameters for last 24 hours: CVP:  [12 mmHg-82 mmHg] 55 mmHg  Intake/Output from previous day: 06/03 0701 - 06/04 0700 In: 3182 [I.V.:1977; NG/GT:895; IV Piggyback:310] Out: 1037 [Urine:1025; Emesis/NG output:12]  Intake/Output this shift:    Vent settings for last 24 hours: Vent Mode:  [-] PRVC FiO2 (%):  [39.9 %-40.2 %] 40.2 % Set Rate:  [18 bmp] 18 bmp Vt Set:  [550 mL] 550 mL PEEP:  [5 cmH20] 5 cmH20 Pressure Support:  [8 cmH20-15 cmH20] 15 cmH20 Plateau Pressure:  [22 cmH20-24 cmH20] 22 cmH20  Physical Exam:  General: on vent Neuro: F/C with L toes, eyes open to voice HEENT/Neck: forehead abrasions Resp: clear to auscultation bilaterally CVS: reg GI: soft, NT, +BS RLE stump dressed, LLE ex fix pin sites look OK, toes warm  Results for orders placed during the hospital encounter of 06/27/11 (from the past 24 hour(s))  GLUCOSE, CAPILLARY     Status: Abnormal   Collection Time   07/03/11  7:41 AM      Component Value Range   Glucose-Capillary 133 (*) 70 - 99 (mg/dL)  CBC     Status: Abnormal   Collection Time   07/03/11  9:30 AM      Component Value Range   WBC 14.4 (*) 4.0 - 10.5 (K/uL)   RBC 2.22 (*) 3.87 - 5.11 (MIL/uL)   Hemoglobin 6.4 (*) 12.0 - 15.0 (g/dL)   HCT 09.8 (*) 11.9 - 46.0 (%)   MCV 90.1  78.0 - 100.0 (fL)   MCH 28.8  26.0 - 34.0 (pg)   MCHC 32.0  30.0 - 36.0 (g/dL)   RDW 14.7  82.9 - 56.2 (%)    Platelets 231  150 - 400 (K/uL)  DIFFERENTIAL     Status: Abnormal   Collection Time   07/03/11  9:30 AM      Component Value Range   Neutrophils Relative 66  43 - 77 (%)   Lymphocytes Relative 17  12 - 46 (%)   Monocytes Relative 11  3 - 12 (%)   Eosinophils Relative 5  0 - 5 (%)   Basophils Relative 1  0 - 1 (%)   Neutro Abs 9.6 (*) 1.7 - 7.7 (K/uL)   Lymphs Abs 2.4  0.7 - 4.0 (K/uL)   Monocytes Absolute 1.6 (*) 0.1 - 1.0 (K/uL)   Eosinophils Absolute 0.7  0.0 - 0.7 (K/uL)   Basophils Absolute 0.1  0.0 - 0.1 (K/uL)   RBC Morphology RARE NRBCs     WBC Morphology MILD LEFT SHIFT (1-5% METAS, OCC MYELO, OCC BANDS)    BASIC METABOLIC PANEL     Status: Abnormal   Collection Time   07/03/11  9:30 AM      Component Value Range   Sodium 137  135 - 145 (mEq/L)   Potassium 3.7  3.5 - 5.1 (mEq/L)   Chloride 100  96 - 112 (mEq/L)   CO2 28  19 - 32 (mEq/L)   Glucose, Bld 136 (*) 70 - 99 (mg/dL)  BUN 20  6 - 23 (mg/dL)   Creatinine, Ser 4.78  0.50 - 1.10 (mg/dL)   Calcium 7.6 (*) 8.4 - 10.5 (mg/dL)   GFR calc non Af Amer >90  >90 (mL/min)   GFR calc Af Amer >90  >90 (mL/min)  CULTURE, BAL-QUANTITATIVE     Status: Normal (Preliminary result)   Collection Time   07/03/11 10:20 AM      Component Value Range   Specimen Description BRONCHIAL ALVEOLAR LAVAGE     Special Requests NONE     Gram Stain       Value: FEW WBC PRESENT,BOTH PMN AND MONONUCLEAR     NO ORGANISMS SEEN     NO ORGANISMS SEEN   Colony Count PENDING     Culture PENDING     Report Status PENDING    GLUCOSE, CAPILLARY     Status: Abnormal   Collection Time   07/03/11 11:25 AM      Component Value Range   Glucose-Capillary 142 (*) 70 - 99 (mg/dL)  GLUCOSE, CAPILLARY     Status: Abnormal   Collection Time   07/03/11  3:04 PM      Component Value Range   Glucose-Capillary 139 (*) 70 - 99 (mg/dL)  URINALYSIS, WITH MICROSCOPIC     Status: Abnormal   Collection Time   07/03/11  4:43 PM      Component Value Range   Color, Urine  AMBER (*) YELLOW    APPearance CLOUDY (*) CLEAR    Specific Gravity, Urine 1.034 (*) 1.005 - 1.030    pH 5.5  5.0 - 8.0    Glucose, UA NEGATIVE  NEGATIVE (mg/dL)   Hgb urine dipstick NEGATIVE  NEGATIVE    Bilirubin Urine NEGATIVE  NEGATIVE    Ketones, ur NEGATIVE  NEGATIVE (mg/dL)   Protein, ur 295 (*) NEGATIVE (mg/dL)   Urobilinogen, UA 2.0 (*) 0.0 - 1.0 (mg/dL)   Nitrite NEGATIVE  NEGATIVE    Leukocytes, UA TRACE (*) NEGATIVE    WBC, UA 0-2  <3 (WBC/hpf)   RBC / HPF 0-2  <3 (RBC/hpf)   Bacteria, UA RARE  RARE    Squamous Epithelial / LPF RARE  RARE    Casts HYALINE CASTS (*) NEGATIVE    Urine-Other AMORPHOUS URATES/PHOSPHATES    GLUCOSE, CAPILLARY     Status: Abnormal   Collection Time   07/03/11  7:47 PM      Component Value Range   Glucose-Capillary 120 (*) 70 - 99 (mg/dL)  GLUCOSE, CAPILLARY     Status: Abnormal   Collection Time   07/04/11 12:53 AM      Component Value Range   Glucose-Capillary 130 (*) 70 - 99 (mg/dL)  CBC     Status: Abnormal   Collection Time   07/04/11  3:50 AM      Component Value Range   WBC 14.9 (*) 4.0 - 10.5 (K/uL)   RBC 2.10 (*) 3.87 - 5.11 (MIL/uL)   Hemoglobin 6.2 (*) 12.0 - 15.0 (g/dL)   HCT 62.1 (*) 30.8 - 46.0 (%)   MCV 90.5  78.0 - 100.0 (fL)   MCH 29.5  26.0 - 34.0 (pg)   MCHC 32.6  30.0 - 36.0 (g/dL)   RDW 65.7  84.6 - 96.2 (%)   Platelets 270  150 - 400 (K/uL)  DIFFERENTIAL     Status: Abnormal   Collection Time   07/04/11  3:50 AM      Component Value Range   Neutro  Abs 9.6 (*) 1.7 - 7.7 (K/uL)   Lymphs Abs 3.1  0.7 - 4.0 (K/uL)   Monocytes Absolute 1.3 (*) 0.1 - 1.0 (K/uL)   Eosinophils Absolute 0.7  0.0 - 0.7 (K/uL)   Basophils Absolute 0.1  0.0 - 0.1 (K/uL)   Neutrophils Relative 64  43 - 77 (%)   Lymphocytes Relative 21  12 - 46 (%)   Monocytes Relative 9  3 - 12 (%)   Eosinophils Relative 5  0 - 5 (%)   Basophils Relative 1  0 - 1 (%)   RBC Morphology POLYCHROMASIA PRESENT    BASIC METABOLIC PANEL     Status: Abnormal    Collection Time   07/04/11  3:50 AM      Component Value Range   Sodium 137  135 - 145 (mEq/L)   Potassium 4.2  3.5 - 5.1 (mEq/L)   Chloride 102  96 - 112 (mEq/L)   CO2 27  19 - 32 (mEq/L)   Glucose, Bld 140 (*) 70 - 99 (mg/dL)   BUN 24 (*) 6 - 23 (mg/dL)   Creatinine, Ser 4.40  0.50 - 1.10 (mg/dL)   Calcium 7.7 (*) 8.4 - 10.5 (mg/dL)   GFR calc non Af Amer 89 (*) >90 (mL/min)   GFR calc Af Amer >90  >90 (mL/min)  GLUCOSE, CAPILLARY     Status: Abnormal   Collection Time   07/04/11  4:15 AM      Component Value Range   Glucose-Capillary 121 (*) 70 - 99 (mg/dL)    Assessment & Plan: Present on Admission:  .Open right tibia/fibula fractures .Left tibia/fibula fractures .Lateral ventral hernia .Shock due to trauma .Acute respiratory failure following trauma and surgery .Laceration of right arm with complication   LOS: 7 days   Additional comments:I reviewed the patient's new clinical lab test results. and CXR PHBC  VDRF - trying to wean on precedex now - did better yesterday - weaned on 15/5 all day R traumatic trans-knee BKA - wound ok per ortho L tib fib Fx s/p ex fix -- possible change to IM nail per Dr. Carola Frost  Left 2nd [facility] fx -- non-op per Dr. Orlan Leavens ?L adrenal hemorrhage  Multiple abrasions - Xeroform and local care, ask wound RN to evaluate today R forearm 5th digit extensor tendon injury s/p repair Traumatic hernia R lateral abdominal wall - elective repair  Suicidal/bipolar - psych consult once extubated  ABL anemia - patient is JW - mother declines blood transfusions. Friend says patient not observant, may give consent once she's off the vent. On Aranesp ID --  BAL, urine and blood cultures pending, see if PICC can be placed, D/C ancef and Gent, start Vanc/Zosyn empiric for now CV -- tachycardia improved Hyperglycemia - SSI - CBG's low 100's  FEN -- tolerating TF VTE - Lovenox Dispo -- VDRF I spoke to the patient's mother and answered her questions Critical Care  Total Time*: 64 Minutes  [name], MD, MPH, FACS Pager: [phone]  07/04/2011  *Care during the described time interval was provided by me and/or other providers on the critical care team.  I have reviewed this patient's available data, including medical history, events of note, physical examination and test results as part of my evaluation.

## 2011-07-04 NOTE — Progress Notes (Addendum)
Nutrition Follow-up  Patient remains intubated & sedated. S/p mini BAL procedure 6/3. EN regimen: Pivot 1.5 infusing at 35 ml/hr via OGT with Prostat liquid protein 30 ml twice daily via tube providing 1460 total kcals, 109 gm protein, 638 ml of free water. Liquid MVI daily. Per RN, patient weaning at this time.   CWOCN note reviewed 6/4 -- patient with several partial thickness abrasions to body and full thickness wounds in patchy areas to left outer breast.  Diet Order:  NPO  Meds: Scheduled Meds:   . antiseptic oral rinse  15 mL Mouth Rinse QID  . bisacodyl  10 mg Rectal Daily  . chlorhexidine  15 mL Mouth Rinse BID  . darbepoetin (ARANESP) injection - NON-DIALYSIS  100 mcg Subcutaneous Q Wed-1800  . docusate  50 mg Oral BID  . enoxaparin (LOVENOX) injection  30 mg Subcutaneous Q12H  . feeding supplement (PIVOT 1.5 CAL)  1,000 mL Per Tube Q24H  . feeding supplement  30 mL Oral BID WC  . insulin aspart  0-20 Units Subcutaneous Q4H  . multivitamin  5 mL Per Tube Daily  . neomycin-bacitracin-polymyxin   Topical Daily  . pantoprazole  40 mg Oral Q1200   Or  . pantoprazole (PROTONIX) IV  40 mg Intravenous Q1200  . piperacillin-tazobactam (ZOSYN)  IV  3.375 g Intravenous Q8H  . selenium  200 mcg Per Tube Daily  . vancomycin  1,250 mg Intravenous Q8H  . vitamin C  1,000 mg Per Tube Q8H  . vitamin e  400 Units Per Tube Q8H  . DISCONTD:  ceFAZolin (ANCEF) IV  1 g Intravenous Q8H  . DISCONTD: gentamicin  530 mg Intravenous Q24H   Continuous Infusions:   . dexmedetomidine (PRECEDEX) IV infusion for high rates 1.1 mcg/kg/hr (07/04/11 1018)  . dextrose 5 % and 0.45 % NaCl with KCl 20 mEq/L 50 mL/hr (07/03/11 2257)  . fentaNYL infusion INTRAVENOUS 200 mcg/hr (07/04/11 0600)  . midazolam (VERSED) infusion Stopped (07/03/11 1211)  . DISCONTD: dexmedetomidine (PRECEDEX) IV infusion 0.7 mcg/kg/hr (07/03/11 1800)   PRN Meds:.acetaminophen, acetaminophen, dextrose, fentaNYL, midazolam,  ondansetron (ZOFRAN) IV, ondansetron  Labs:  CMP     Component Value Date/Time   NA 137 07/04/2011 0350   K 4.2 07/04/2011 0350   CL 102 07/04/2011 0350   CO2 27 07/04/2011 0350   GLUCOSE 140* 07/04/2011 0350   BUN 24* 07/04/2011 0350   CREATININE 0.89 07/04/2011 0350   CALCIUM 7.7* 07/04/2011 0350   PROT 4.1* 06/27/2011 0435   ALBUMIN 2.0* 06/27/2011 0435   AST 98* 06/27/2011 0435   ALT 44* 06/27/2011 0435   ALKPHOS 35* 06/27/2011 0435   BILITOT 0.4 06/27/2011 0435   GFRNONAA 89* 07/04/2011 0350   GFRAA >90 07/04/2011 0350     Intake/Output Summary (Last 24 hours) at 07/04/11 1203 Last data filed at 07/04/11 1100  Gross per 24 hour  Intake 3302.68 ml  Output   1082 ml  Net 2220.68 ml    CBG (last 3)   Basename 07/04/11 0737 07/04/11 0415 07/04/11 0053  GLUCAP 152* 121* 130*    Weight Status:  114.1 kg (6/4) -- fluctuating   Re-estimated needs:  2200 kcals, 100-110 gm protein  Nutrition Dx:  Inadequate Oral Intake, ongoing  Goal:  EN to provide 60-70% of estimated calorie needs (22-25 kcals/kg ideal body weight) and 100% of estimated protein needs, based on ASPEN guidelines for permissive underfeeding in critically ill obese individuals, met Monitor: EN regimen, respiratory status, weight, labs, I/O's  Intervention:    Continue current EN regimen & MVI daily  RD to follow for nutrition care plan  Alger Memos Pager #:  [phone]

## 2011-07-04 NOTE — Progress Notes (Signed)
CRITICAL VALUE ALERT  Critical value received:  Hgb 6.2  Date of notification:  07/04/11  Time of notification:  0419  Critical value read back: yes   Nurse who received alert:  A. Lamar Benes RN  MD notified (1st page):  Consistent with value from yesterday  Time of first page:    MD notified (2nd page):  Time of second page:  Responding MD:    Time MD responded:

## 2011-07-05 ENCOUNTER — Inpatient Hospital Stay (HOSPITAL_COMMUNITY): Payer: Medicare Other

## 2011-07-05 LAB — BASIC METABOLIC PANEL
Calcium: 6.7 mg/dL — ABNORMAL LOW (ref 8.4–10.5)
Creatinine, Ser: 0.76 mg/dL (ref 0.50–1.10)
GFR calc non Af Amer: >90 mL/min (ref >90)
Glucose, Bld: 119 mg/dL — ABNORMAL HIGH (ref 70–99)
Sodium: 141 mEq/L (ref 135–145)

## 2011-07-05 LAB — CULTURE, BAL-QUANTITATIVE W GRAM STAIN

## 2011-07-05 LAB — CBC
MCH: 29.6 pg (ref 26.0–34.0)
MCHC: 32.1 g/dL (ref 30.0–36.0)
Platelets: 267 10*3/uL (ref 150–400)

## 2011-07-05 LAB — GLUCOSE, CAPILLARY: Glucose-Capillary: 108 mg/dL — ABNORMAL HIGH (ref 70–99)

## 2011-07-05 MED ORDER — CLONAZEPAM 0.1 MG/ML ORAL SUSPENSION: 0.2500 mg | Freq: Three times a day (TID) | ORAL | Status: DC

## 2011-07-05 MED ORDER — CLONAZEPAM 0.5 MG PO TABS
0.2500 mg | ORAL_TABLET | Freq: Three times a day (TID) | ORAL | Status: DC
  Administered 2011-07-05 – 2011-07-06 (×4): 0.25 mg
  Filled 2011-07-05 (×4): qty 1

## 2011-07-05 MED ORDER — VANCOMYCIN HCL IN DEXTROSE 1-5 GM/200ML-% IV SOLN
1000.0000 mg | Freq: Three times a day (TID) | INTRAVENOUS | Status: DC
  Administered 2011-07-06 – 2011-07-10 (×14): 1000 mg via INTRAVENOUS
  Filled 2011-07-05 (×15): qty 200

## 2011-07-05 MED ORDER — SODIUM CHLORIDE 0.9 % IV SOLN
1.0000 g | Freq: Once | INTRAVENOUS | Status: AC
  Administered 2011-07-05: 1 g via INTRAVENOUS
  Filled 2011-07-05: qty 10

## 2011-07-05 MED ORDER — POTASSIUM CHLORIDE 20 MEQ/15ML (10%) PO LIQD
40.0000 meq | Freq: Once | ORAL | Status: AC
  Administered 2011-07-05: 40 meq via ORAL
  Filled 2011-07-05: qty 30

## 2011-07-05 MED ORDER — QUETIAPINE FUMARATE 25 MG PO TABS
25.0000 mg | ORAL_TABLET | Freq: Three times a day (TID) | ORAL | Status: DC
  Administered 2011-07-05 – 2011-07-06 (×4): 25 mg
  Filled 2011-07-05 (×10): qty 1

## 2011-07-05 MED ORDER — POTASSIUM CHLORIDE 20 MEQ/15ML (10%) PO LIQD
ORAL | Status: AC
  Administered 2011-07-05: 40 meq
  Filled 2011-07-05: qty 30

## 2011-07-05 NOTE — Progress Notes (Signed)
Tube still not repositioned due to bite block, tube has come out to 19 at the teeth but note from xray said when tube was at 22 that it was upon the carina and could be pulled up to 3cm...did not advance the tube at this time.
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
## 2011-07-05 NOTE — Progress Notes (Signed)
CRITICAL VALUE ALERT  Critical value received:  Hgb 5.3   Date of notification:  07/05/11  Time of notification:    Critical value read back: lab did not call   Nurse who received alert:  A Redmond Whittley RN  MD notified (1st page):  Dr. Janee Morn  Time of first page:  0520  MD notified (2nd page):  Time of second page:  Responding MD:  Dr. Janee Morn   Time MD responded:  [phone]  No new orders at this time. Thresa Ross RN

## 2011-07-05 NOTE — Progress Notes (Signed)
Patient ID: Tonya [name], female   DOB: 01/29/1986, 26 y.o.   MRN: [id number] Follow up - Trauma Critical Care  Patient Details:    Tonya [name] is an 26 y.o. female.  Lines/tubes : AIRWAYS 7.5 mm (Active)  Secured at (cm) 24 cm 07/03/2011  8:00 AM  Measured From Lips 07/03/2011  8:00 AM  Secured Location Center 07/03/2011  8:00 AM  Secured By [employer] 07/03/2011  8:00 AM  Site Condition Dry 07/03/2011  8:00 AM     Airway 7.5 mm (Active)  Secured at (cm) 24 cm 07/04/2011  4:11 AM  Measured From Lips 07/04/2011  4:11 AM  Secured Location Left 07/04/2011  4:11 AM  Secured By [employer] 07/04/2011  4:11 AM  Tube Holder Repositioned Yes 07/04/2011  4:11 AM  Cuff Pressure (cm H2O) 24 cm H2O 07/03/2011  4:04 PM  Site Condition Dry 07/04/2011  4:11 AM     CVC Triple Lumen 06/27/11 Left Subclavian (Active)  Site Assessment Clean;Dry;Intact 07/03/2011  9:00 PM  Proximal Lumen Status Capped (Central line) 07/03/2011  9:00 PM  Medial Capped (Central line) 07/03/2011  9:00 PM  Distal Lumen Status Infusing 07/03/2011  9:00 PM  Dressing Type Transparent 07/03/2011  9:00 PM  Dressing Status Clean;Dry;Intact;Antimicrobial disc in place 07/03/2011  9:00 PM  Line Care Connections checked and tightened 07/03/2011  9:00 PM  Dressing Intervention New dressing;Antimicrobial disc changed 06/28/2011 11:00 PM  Dressing Change Due 07/05/11 07/03/2011  9:00 PM  Indication for Insertion or Continuance of Line Limited venous access - need for IV therapy >5 days (PICC only) 07/03/2011  8:00 AM     NG/OG Tube Orogastric (Active)  Placement Verification Auscultation 07/03/2011  8:15 PM  Site Assessment Clean;Dry;Intact 07/03/2011  8:15 PM  Status Infusing tube feed 07/03/2011  8:15 PM  Drainage Appearance None 07/03/2011  8:00 AM  Gastric Residual 150 mL 07/04/2011  6:00 AM  Intake (mL) 35 mL 07/04/2011  6:00 AM  Output (mL) 7 mL 07/03/2011  4:00 PM     Urethral Catheter (Active)  Site Assessment Clean;Intact 07/03/2011   8:15 PM  Collection Container Standard drainage bag 07/03/2011  8:15 PM  Securement Method Leg strap 07/03/2011  8:15 PM  Urinary Catheter Interventions Unclamped 07/03/2011  8:00 AM  Indication for Insertion or Continuance of Catheter Urinary output monitoring;Physician order 07/03/2011  8:15 PM  Output (mL) 60 mL 07/04/2011  6:00 AM    Microbiology/Sepsis markers: Results for orders placed during the hospital encounter of 06/27/11  MRSA PCR SCREENING     Status: Normal   Collection Time   06/27/11  4:45 AM      Component Value Range Status Comment   MRSA by PCR NEGATIVE  NEGATIVE  Final   CULTURE, RESPIRATORY     Status: Normal   Collection Time   06/30/11  8:18 AM      Component Value Range Status Comment   Specimen Description ENDOTRACHEAL ASPIRATE   Final    Special Requests NONE   Final    Gram Stain     Final    Value: RARE WBC PRESENT,BOTH PMN AND MONONUCLEAR     NO SQUAMOUS EPITHELIAL CELLS SEEN     NO ORGANISMS SEEN   Culture NO GROWTH 2 DAYS   Final    Report Status 07/02/2011 FINAL   Final   URINE CULTURE     Status: Normal   Collection Time   06/30/11  9:45 AM  Component Value Range Status Comment   Specimen Description URINE, CATHETERIZED   Final    Special Requests NONE   Final    Culture  Setup Time [id number]   Final    Colony Count NO GROWTH   Final    Culture NO GROWTH   Final    Report Status 07/01/2011 FINAL   Final   CULTURE, BLOOD (ROUTINE X 2)     Status: Normal (Preliminary result)   Collection Time   06/30/11 10:30 AM      Component Value Range Status Comment   Specimen Description BLOOD ARM LEFT   Final    Special Requests BOTTLES DRAWN AEROBIC ONLY 8.0CC ONLY    Final    Culture  Setup Time [id number]   Final    Culture     Final    Value:        BLOOD CULTURE RECEIVED NO GROWTH TO DATE CULTURE WILL BE HELD FOR 5 DAYS BEFORE ISSUING A FINAL NEGATIVE REPORT   Report Status PENDING   Incomplete   CULTURE, BLOOD (ROUTINE X 2)     Status: Normal  (Preliminary result)   Collection Time   07/03/11  9:10 AM      Component Value Range Status Comment   Specimen Description BLOOD LEFT ARM   Final    Special Requests BOTTLES DRAWN AEROBIC AND ANAEROBIC 10CC   Final    Culture  Setup Time [id number]   Final    Culture     Final    Value:        BLOOD CULTURE RECEIVED NO GROWTH TO DATE CULTURE WILL BE HELD FOR 5 DAYS BEFORE ISSUING A FINAL NEGATIVE REPORT   Report Status PENDING   Incomplete   CULTURE, BLOOD (ROUTINE X 2)     Status: Normal (Preliminary result)   Collection Time   07/03/11  9:30 AM      Component Value Range Status Comment   Specimen Description BLOOD CENTRAL LINE   Final    Special Requests     Final    Value: BOTTLES DRAWN AEROBIC AND ANAEROBIC 10CC CENTRAL LINE IJ   Culture  Setup Time [id number]   Final    Culture     Final    Value:        BLOOD CULTURE RECEIVED NO GROWTH TO DATE CULTURE WILL BE HELD FOR 5 DAYS BEFORE ISSUING A FINAL NEGATIVE REPORT   Report Status PENDING   Incomplete   CULTURE, BAL-QUANTITATIVE     Status: Normal   Collection Time   07/03/11 10:20 AM      Component Value Range Status Comment   Specimen Description BRONCHIAL ALVEOLAR LAVAGE   Final    Special Requests NONE   Final    Gram Stain     Final    Value: FEW WBC PRESENT,BOTH PMN AND MONONUCLEAR     NO ORGANISMS SEEN     NO ORGANISMS SEEN   Colony Count NO GROWTH   Final    Culture NO GROWTH 2 DAYS   Final    Report Status 07/05/2011 FINAL   Final     Anti-infectives:  Anti-infectives     Start     Dose/Rate Route Frequency Ordered Stop   07/04/11 1000   vancomycin (VANCOCIN) 1,250 mg in sodium chloride 0.9 % 250 mL IVPB        1,250 mg 166.7 mL/hr over 90 Minutes Intravenous Every 8 hours 07/04/11 0807  08/02/2011 0900   piperacillin-tazobactam (ZOSYN) IVPB 3.375 g        3.375 g 12.5 mL/hr over 240 Minutes Intravenous 3 times per day 08-02-2011 0748     06/27/11 1400   ceFAZolin (ANCEF) IVPB 1 g/50 mL premix  Status:   Discontinued        1 g 100 mL/hr over 30 Minutes Intravenous 3 times per day 06/27/11 1056 2011-08-02 0748   06/27/11 1300   gentamicin (GARAMYCIN) 530 mg in dextrose 5 % 100 mL IVPB  Status:  Discontinued        530 mg 113.3 mL/hr over 60 Minutes Intravenous Every 24 hours 06/27/11 1214 08-02-11 0748          Best Practice/Protocols:  VTE Prophylaxis: Lovenox (prophylaxtic dose) Continous Sedation  Consults: Treatment Team:  Budd Palmer, MD Sharma Covert, MD    Studies:   Dg Chest Port 1 View  Aug 02, 2011  *RADIOLOGY REPORT*  Clinical Data: Respiratory failure.  PORTABLE CHEST - 1 VIEW  Comparison: 07/03/2011.  Findings: The support apparatus is stable.  Persistent low lung volumes with vascular crowding and areas of atelectasis.  IMPRESSION: Stable support apparatus. Persistent low lung volumes with vascular crowding and areas of atelectasis.  Original Report Authenticated By: P. Loralie Champagne, M.D.   Dg Chest Port 1 View  07/03/2011  *RADIOLOGY REPORT*  Clinical Data: Trauma and respiratory failure.  PORTABLE CHEST - 1 VIEW  Comparison: 07/01/2028  Findings: The endotracheal tube tip now lies approximately 1.5 cm above the carina.  Central line positioning stable.  No pneumothorax identified.  Mild lower lobe atelectasis present bilaterally.  No evidence of significant pleural effusion.  Heart size and mediastinal contours are stable.  IMPRESSION: Mild bilateral lower lobe atelectasis.  Original Report Authenticated By: Reola Calkins, M.D.      Events:  Subjective:    Overnight Issues: febrile, on vent.  Objective:  Vital signs for last 24 hours: Temp:  [97.9 F (36.6 C)-101.8 F (38.8 C)] 101.8 F (38.8 C) (06/05 0740) Pulse Rate:  [85-142] 114  (06/05 0700) Resp:  [18-36] 20  (06/05 0700) BP: (91-114)/(33-64) 101/42 mmHg (06/05 0700) SpO2:  [93 %-100 %] 98 % (06/05 0700) FiO2 (%):  [39.7 %-60.6 %] 49.8 % (06/05 0700) Weight:  [257 lb 15 oz (117 kg)] 257  lb 15 oz (117 kg) (06/05 0500)  Hemodynamic parameters for last 24 hours: CVP:  [0 mmHg-58 mmHg] 0 mmHg  Intake/Output from previous day: 08-02-2022 0701 - 06/05 0700 In: 4357.5 [I.V.:2322.5; NG/GT:1075; IV Piggyback:960] Out: 1445 [Urine:1445]  Intake/Output this shift:    Vent settings for last 24 hours: Vent Mode:  [-] PRVC FiO2 (%):  [39.7 %-60.6 %] 49.8 % Set Rate:  [18 bmp] 18 bmp Vt Set:  [550 mL] 550 mL PEEP:  [5 cmH20] 5 cmH20 Pressure Support:  [15 cmH20] 15 cmH20 Plateau Pressure:  [25 cmH20-26 cmH20] 25 cmH20  Physical Exam:  General: on vent, agitated Neuro: F/C with L toes, eyes open to voice HEENT/Neck: forehead abrasions Resp: coarse breath sounds bilaterally CVS: increased rate, regular rhythm GI: soft, NT, +BS RLE stump dressed, LLE ex fix pin sites look OK, toes warm  Results for orders placed during the hospital encounter of 06/27/11 (from the past 24 hour(s))  GLUCOSE, CAPILLARY     Status: Abnormal   Collection Time   08/02/2011 12:27 PM      Component Value Range   Glucose-Capillary 124 (*) 70 - 99 (mg/dL)  Comment 1 Documented in Chart     Comment 2 Notify RN    GLUCOSE, CAPILLARY     Status: Abnormal   Collection Time   07/04/11  3:22 PM      Component Value Range   Glucose-Capillary 155 (*) 70 - 99 (mg/dL)   Comment 1 Notify RN    GLUCOSE, CAPILLARY     Status: Abnormal   Collection Time   07/04/11  8:04 PM      Component Value Range   Glucose-Capillary 137 (*) 70 - 99 (mg/dL)   Comment 1 Notify RN    GLUCOSE, CAPILLARY     Status: Abnormal   Collection Time   07/05/11 12:24 AM      Component Value Range   Glucose-Capillary 130 (*) 70 - 99 (mg/dL)  GLUCOSE, CAPILLARY     Status: Abnormal   Collection Time   07/05/11  4:08 AM      Component Value Range   Glucose-Capillary 138 (*) 70 - 99 (mg/dL)  CBC     Status: Abnormal   Collection Time   07/05/11  4:19 AM      Component Value Range   WBC 14.4 (*) 4.0 - 10.5 (K/uL)   RBC 1.79 (*) 3.87 - 5.11  (MIL/uL)   Hemoglobin 5.3 (*) 12.0 - 15.0 (g/dL)   HCT 78.2 (*) 95.6 - 46.0 (%)   MCV 92.2  78.0 - 100.0 (fL)   MCH 29.6  26.0 - 34.0 (pg)   MCHC 32.1  30.0 - 36.0 (g/dL)   RDW 21.3  08.6 - 57.8 (%)   Platelets 267  150 - 400 (K/uL)  BASIC METABOLIC PANEL     Status: Abnormal   Collection Time   07/05/11  4:19 AM      Component Value Range   Sodium 141  135 - 145 (mEq/L)   Potassium 3.4 (*) 3.5 - 5.1 (mEq/L)   Chloride 111  96 - 112 (mEq/L)   CO2 22  19 - 32 (mEq/L)   Glucose, Bld 119 (*) 70 - 99 (mg/dL)   BUN 21  6 - 23 (mg/dL)   Creatinine, Ser 4.69  0.50 - 1.10 (mg/dL)   Calcium 6.7 (*) 8.4 - 10.5 (mg/dL)   GFR calc non Af Amer >90  >90 (mL/min)   GFR calc Af Amer >90  >90 (mL/min)  GLUCOSE, CAPILLARY     Status: Abnormal   Collection Time   07/05/11  7:37 AM      Component Value Range   Glucose-Capillary 129 (*) 70 - 99 (mg/dL)   Comment 1 Documented in Chart     Comment 2 Notify RN      Assessment & Plan: Present on Admission:  .Open right tibia/fibula fractures .Left tibia/fibula fractures .Lateral ventral hernia .Shock due to trauma .Acute respiratory failure following trauma and surgery .Laceration of right arm with complication   LOS: 8 days   Additional comments:I reviewed the patient's new clinical lab test results. and CXR PHBC  VDRF - not weaning well; ETT tip high. P:  Start Seroquel and Klonopin; advance ETT and recheck CXR. R traumatic trans-knee BKA - wound ok per ortho L tib fib Fx s/p ex fix -- possible change to IM nail per Dr. Carola Frost  Left 2nd [facility] fx -- non-op per Dr. Orlan Leavens ?L adrenal hemorrhage  Multiple abrasions - Xeroform and local care, no cellulitis R forearm 5th digit extensor tendon injury s/p repair Traumatic hernia R lateral abdominal wall - elective  repair  Suicidal/bipolar - psych consult once extubated  ABL anemia - hgb down today; patient is JW - mother declines blood transfusions. Fr On Aranesp ID --  Cultures no growth to date;  Vanc/Zosyn empiric for now CV -- tachycardia improved Hyperglycemia - SSI  FEN -- tolerating TF;mild hypokalemia-will replete VTE - Lovenox Dispo -- VDRF   Critical Care Total Time*: 30 Minutes  Avel Peace, MD 07/05/2011  *Care during the described time interval was provided by me and/or other providers on the critical care team.  I have reviewed this patient's available data, including medical history, events of note, physical examination and test results as part of my evaluation.

## 2011-07-05 NOTE — Progress Notes (Signed)
ETT advanced to 22 cm per MD.

## 2011-07-05 NOTE — Progress Notes (Signed)
Pt reviewed in hospital LOS meeting today.  
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
## 2011-07-05 NOTE — Progress Notes (Signed)
Attempted  2 times today for wean with both times unsuccessful with RR  And HR elevated.

## 2011-07-06 LAB — BASIC METABOLIC PANEL
BUN: 20 mg/dL (ref 6–23)
CO2: 25 mEq/L (ref 19–32)
Chloride: 106 mEq/L (ref 96–112)
Creatinine, Ser: 0.84 mg/dL (ref 0.50–1.10)
GFR calc Af Amer: >90 mL/min (ref >90)
Glucose, Bld: 128 mg/dL — ABNORMAL HIGH (ref 70–99)
Potassium: 3.7 mEq/L (ref 3.5–5.1)

## 2011-07-06 LAB — CBC
HCT: 18.7 % — ABNORMAL LOW (ref 36.0–46.0)
Hemoglobin: 5.8 g/dL — CL (ref 12.0–15.0)
MCV: 94.4 fL (ref 78.0–100.0)
RBC: 1.98 MIL/uL — ABNORMAL LOW (ref 3.87–5.11)
RDW: 15.9 % — ABNORMAL HIGH (ref 11.5–15.5)
WBC: 16.1 10*3/uL — ABNORMAL HIGH (ref 4.0–10.5)

## 2011-07-06 LAB — GLUCOSE, CAPILLARY
Glucose-Capillary: 119 mg/dL — ABNORMAL HIGH (ref 70–99)
Glucose-Capillary: 125 mg/dL — ABNORMAL HIGH (ref 70–99)
Glucose-Capillary: 133 mg/dL — ABNORMAL HIGH (ref 70–99)
Glucose-Capillary: 125 mg/dL — ABNORMAL HIGH (ref 70–99)

## 2011-07-06 LAB — CULTURE, BLOOD (ROUTINE X 2): Culture: NO GROWTH

## 2011-07-06 MED ORDER — ALTEPLASE 100 MG IV SOLR
2.0000 mg | Freq: Once | INTRAVENOUS | Status: AC
  Administered 2011-07-06: 2 mg
  Filled 2011-07-06: qty 2

## 2011-07-06 MED ORDER — QUETIAPINE FUMARATE 50 MG PO TABS
50.0000 mg | ORAL_TABLET | Freq: Three times a day (TID) | ORAL | Status: DC
  Administered 2011-07-06 – 2011-07-07 (×3): 50 mg
  Filled 2011-07-06 (×6): qty 1

## 2011-07-06 MED ORDER — FERROUS SULFATE 300 (60 FE) MG/5ML PO SYRP
300.0000 mg | ORAL_SOLUTION | Freq: Three times a day (TID) | ORAL | Status: DC
  Administered 2011-07-06 – 2011-07-16 (×26): 300 mg via ORAL
  Filled 2011-07-06 (×37): qty 5

## 2011-07-06 MED ORDER — CLONAZEPAM 0.5 MG PO TABS
0.5000 mg | ORAL_TABLET | Freq: Three times a day (TID) | ORAL | Status: DC
  Administered 2011-07-06 – 2011-07-07 (×3): 0.5 mg
  Filled 2011-07-06 (×3): qty 1

## 2011-07-06 MED ORDER — FERROUS SULFATE 220 (44 FE) MG/5ML PO ELIX
220.0000 mg | ORAL_SOLUTION | Freq: Three times a day (TID) | ORAL | Status: DC
  Filled 2011-07-06 (×3): qty 5

## 2011-07-06 NOTE — Progress Notes (Signed)
Patient ID: Tonya [name], female   DOB: 05-03-1985, 26 y.o.   MRN: [id number] I tried to call patient's mother, Darel Hong at [phone] but got no answer.  I left a VM. [name], MD, MPH, FACS Pager: [phone]

## 2011-07-06 NOTE — Clinical Social Work Note (Signed)
Clinical Social Worker and Sports coach were called by 2300 RN to address concerns from patient boyfriend.  CSW and CM met with patient boyfriend in 2300 conference room to listen and address concerns as able.  Patient mother and brother are both involved in patient care and patient boyfriend expressed issues surrounding the idea of patient not being able to receive blood while here in the hospital.  Patient boyfriend understands that consent for procedures/blood must go through patient mother.  Patient boyfriend was very reasonable and even stated that even though he disagreed with their decisions he respected her brother for taking a stand.    Patient boyfriend seems genuinely concerned for patient well being and wanted to inform staff of conversations he had had with patient mother addressing the concern around patient receiving blood products when/if needed.  CSW and CM spoke with MD in regards to a possible family meeting to further address patient plan of care and discuss for further understanding of the [employer] faith and possible alternatives.  CM to contact patient mother to arrange family meeting.  Patient boyfriend understands he may/may not be involved per patient mother's decision.  Patient boyfriend was able to verbalize his appreciation for the opportunity to voice his concerns regarding patient care regardless of decision making abilities.  Clinical Social Worker and Case Manager will continue to remain available for support and to facilitate family meeting once arrangements are made.  [address] [place], [place] [phone]

## 2011-07-06 NOTE — Progress Notes (Signed)
Patient ID: Tonya [name], female   DOB: Jun 22, 1985, 26 y.o.   MRN: [id number] Follow up - Trauma Critical Care  Patient Details:    Tonya [name] is an 26 y.o. female.  Lines/tubes : AIRWAYS 7.5 mm (Active)  Secured at (cm) 21 cm 07/05/2011  9:00 AM  Measured From Lips 07/05/2011  9:00 AM  Secured Location Center 07/05/2011  9:00 AM  Secured By [employer] 07/05/2011  9:00 AM  Site Condition Dry 07/05/2011  9:00 AM     Airway 7.5 mm (Active)  Secured at (cm) 23 cm 07/06/2011  3:50 AM  Measured From Lips 07/06/2011  3:50 AM  Secured Location Right 07/06/2011  3:50 AM  Secured By [employer] 07/06/2011  3:50 AM  Tube Holder Repositioned Yes 07/06/2011  3:50 AM  Cuff Pressure (cm H2O) 24 cm H2O 07/05/2011  8:50 AM  Site Condition Dry 07/06/2011  3:50 AM     PICC Triple Lumen 07/04/11 PICC Right Brachial (Active)  Length mark (cm) 5 cm 07/04/2011  5:25 PM  Site Assessment Clean;Dry;Intact 07/05/2011  8:00 PM  Lumen #1 Status Infusing 07/05/2011  8:30 PM  Lumen #2 Status Flushed;Capped (Central line) 07/05/2011  8:30 PM  Lumen #3 Status Infusing 07/05/2011  8:30 PM  Dressing Type Transparent 07/05/2011  8:00 PM  Dressing Status Clean;Dry;Intact;Antimicrobial disc in place 07/05/2011  8:00 PM  Dressing Intervention Dressing changed;Antimicrobial disc changed 07/05/2011  6:00 PM  Dressing Change Due 07/12/11 07/05/2011  8:00 PM  Indication for Insertion or Continuance of Line Prolonged intravenous therapies 07/05/2011  6:00 PM     NG/OG Tube Orogastric (Active)  Placement Verification Auscultation 07/05/2011  8:00 PM  Site Assessment Clean;Dry;Intact 07/05/2011  8:00 PM  Status Irrigated;Infusing tube feed 07/05/2011  8:00 PM  Drainage Appearance Bile;Tan 07/05/2011  8:00 PM  Gastric Residual 50 mL 07/06/2011 12:00 AM  Intake (mL) 35 mL 07/06/2011  7:00 AM  Output (mL) 10 mL 07/05/2011  4:00 PM     Urethral Catheter (Active)  Site Assessment Clean;Intact 07/05/2011  8:00 PM  Collection Container  Standard drainage bag 07/05/2011  8:00 PM  Securement Method Leg strap 07/05/2011  8:00 PM  Urinary Catheter Interventions Unclamped 07/03/2011  8:00 AM  Indication for Insertion or Continuance of Catheter Prolonged immobilization;Urinary output monitoring 07/05/2011  8:00 PM  Output (mL) 125 mL 07/06/2011  7:00 AM    Microbiology/Sepsis markers: Results for orders placed during the hospital encounter of 06/27/11  MRSA PCR SCREENING     Status: Normal   Collection Time   06/27/11  4:45 AM      Component Value Range Status Comment   MRSA by PCR NEGATIVE  NEGATIVE  Final   CULTURE, RESPIRATORY     Status: Normal   Collection Time   06/30/11  8:18 AM      Component Value Range Status Comment   Specimen Description ENDOTRACHEAL ASPIRATE   Final    Special Requests NONE   Final    Gram Stain     Final    Value: RARE WBC PRESENT,BOTH PMN AND MONONUCLEAR     NO SQUAMOUS EPITHELIAL CELLS SEEN     NO ORGANISMS SEEN   Culture NO GROWTH 2 DAYS   Final    Report Status 07/02/2011 FINAL   Final   URINE CULTURE     Status: Normal   Collection Time   06/30/11  9:45 AM      Component Value Range Status Comment  Specimen Description URINE, CATHETERIZED   Final    Special Requests NONE   Final    Culture  Setup Time [id number]   Final    Colony Count NO GROWTH   Final    Culture NO GROWTH   Final    Report Status 07/01/2011 FINAL   Final   CULTURE, BLOOD (ROUTINE X 2)     Status: Normal (Preliminary result)   Collection Time   06/30/11 10:30 AM      Component Value Range Status Comment   Specimen Description BLOOD ARM LEFT   Final    Special Requests BOTTLES DRAWN AEROBIC ONLY 8.0CC ONLY    Final    Culture  Setup Time [id number]   Final    Culture     Final    Value:        BLOOD CULTURE RECEIVED NO GROWTH TO DATE CULTURE WILL BE HELD FOR 5 DAYS BEFORE ISSUING A FINAL NEGATIVE REPORT   Report Status PENDING   Incomplete   CULTURE, BLOOD (ROUTINE X 2)     Status: Normal (Preliminary result)    Collection Time   07/03/11  9:10 AM      Component Value Range Status Comment   Specimen Description BLOOD LEFT ARM   Final    Special Requests BOTTLES DRAWN AEROBIC AND ANAEROBIC 10CC   Final    Culture  Setup Time [id number]   Final    Culture     Final    Value:        BLOOD CULTURE RECEIVED NO GROWTH TO DATE CULTURE WILL BE HELD FOR 5 DAYS BEFORE ISSUING A FINAL NEGATIVE REPORT   Report Status PENDING   Incomplete   CULTURE, BLOOD (ROUTINE X 2)     Status: Normal (Preliminary result)   Collection Time   07/03/11  9:30 AM      Component Value Range Status Comment   Specimen Description BLOOD CENTRAL LINE   Final    Special Requests     Final    Value: BOTTLES DRAWN AEROBIC AND ANAEROBIC 10CC CENTRAL LINE IJ   Culture  Setup Time [id number]   Final    Culture     Final    Value:        BLOOD CULTURE RECEIVED NO GROWTH TO DATE CULTURE WILL BE HELD FOR 5 DAYS BEFORE ISSUING A FINAL NEGATIVE REPORT   Report Status PENDING   Incomplete   CULTURE, BAL-QUANTITATIVE     Status: Normal   Collection Time   07/03/11 10:20 AM      Component Value Range Status Comment   Specimen Description BRONCHIAL ALVEOLAR LAVAGE   Final    Special Requests NONE   Final    Gram Stain     Final    Value: FEW WBC PRESENT,BOTH PMN AND MONONUCLEAR     NO ORGANISMS SEEN     NO ORGANISMS SEEN   Colony Count NO GROWTH   Final    Culture NO GROWTH 2 DAYS   Final    Report Status 07/05/2011 FINAL   Final     Anti-infectives:  Anti-infectives     Start     Dose/Rate Route Frequency Ordered Stop   07/06/11 0200   vancomycin (VANCOCIN) IVPB 1000 mg/200 mL premix        1,000 mg 200 mL/hr over 60 Minutes Intravenous Every 8 hours 07/05/11 1920     07/04/11 1000   vancomycin (VANCOCIN) 1,250 mg in sodium chloride  0.9 % 250 mL IVPB  Status:  Discontinued        1,250 mg 166.7 mL/hr over 90 Minutes Intravenous Every 8 hours 07/04/11 0807 07/22/2011 1920   07/04/11 0900  piperacillin-tazobactam (ZOSYN) IVPB 3.375 g        3.375 g 12.5 mL/hr over 240 Minutes Intravenous 3 times per day 07/04/11 0748     06/27/11 1400   ceFAZolin (ANCEF) IVPB 1 g/50 mL premix  Status:  Discontinued        1 g 100 mL/hr over 30 Minutes Intravenous 3 times per day 06/27/11 1056 07/04/11 0748   06/27/11 1300   gentamicin (GARAMYCIN) 530 mg in dextrose 5 % 100 mL IVPB  Status:  Discontinued        530 mg 113.3 mL/hr over 60 Minutes Intravenous Every 24 hours 06/27/11 1214 07/04/11 0748          Best Practice/Protocols:  VTE Prophylaxis: Lovenox (prophylaxtic dose) Continous Sedation  Consults: Treatment Team:  Budd Palmer, MD Sharma Covert, MD    Studies:   Dg Chest Port 1 View  2011/07/22  *RADIOLOGY REPORT*  Clinical Data: Ventilator dependent respiratory failure.  Follow up atelectasis.  PORTABLE CHEST - 1 VIEW 07/22/11 0945 hours:  Comparison: Portable chest x-ray earlier same date 0544 hours and dating back to 07/01/2011.  Findings: Endotracheal tube tip in satisfactory position approximately 3 cm above the carina.  Right arm PICC tip in the SVC.  Nasogastric tube courses below the diaphragm into the stomach.  Interval worsening of consolidation in the lower lobes since the examination earlier today.  Upper lungs remain essentially clear.  Cardiac silhouette upper normal in size for technique.  Pulmonary vascularity normal.  IMPRESSION: Support apparatus satisfactory, including the endotracheal tube. Interval worsening of atelectasis in the lung bases, left greater than right, since earlier today.  Original Report Authenticated By: Arnell Sieving, M.D.   Dg Chest Port 1 View  22-Jul-2011  *RADIOLOGY REPORT*  Clinical Data: Follow up pulmonary status.  PORTABLE CHEST - 1 VIEW  Comparison: 07/04/2011.  Findings: The endotracheal tube is 5.6 cm above the carina.  The right PICC line is stable.  The cardiac silhouette, mediastinal and hilar contours are unchanged.  There is persistent perihilar and bibasilar  atelectasis.  No pneumothorax.  IMPRESSION:  1.  Support apparatus in good position without complicating features. 2.  Persistent perihilar and bibasilar atelectasis.  Original Report Authenticated By: P. Loralie Champagne, M.D.      Events:  Subjective:    Overnight Issues: did not wean as well yesterday  Objective:  Vital signs for last 24 hours: Temp:  [99.6 F (37.6 C)-101.2 F (38.4 C)] 99.6 F (37.6 C) (06/06 0400) Pulse Rate:  [111-131] 118  (06/06 0700) Resp:  [17-30] 19  (06/06 0700) BP: (96-129)/(35-67) 129/67 mmHg (06/06 0700) SpO2:  [93 %-98 %] 95 % (06/06 0700) FiO2 (%):  [39.7 %-49.4 %] 40.1 % (06/06 0700)  Hemodynamic parameters for last 24 hours:    Intake/Output from previous day: 07-22-22 0701 - 06/06 0700 In: 4254 [I.V.:2184; NG/GT:1110; IV Piggyback:960] Out: 2235 [Urine:1985; Emesis/NG output:250]  Intake/Output this shift:    Vent settings for last 24 hours: Vent Mode:  [-] PRVC FiO2 (%):  [39.7 %-49.4 %] 40.1 % Set Rate:  [18 bmp] 18 bmp Vt Set:  [550 mL] 550 mL PEEP:  [5 cmH20] 5 cmH20 Pressure Support:  [15 cmH20] 15 cmH20 Plateau Pressure:  [22 cmH20-23 cmH20] 23 cmH20  Physical Exam:  General: on vent Neuro: awake and F/C while on vent Resp: clear to auscultation bilaterally CVS: reg 110's GI: soft, NT, ND, +BS Ex fix LLE, toes warm, dressing RLE stump  Results for orders placed during the hospital encounter of 06/27/11 (from the past 24 hour(s))  GLUCOSE, CAPILLARY     Status: Abnormal   Collection Time   07/05/11 11:54 AM      Component Value Range   Glucose-Capillary 123 (*) 70 - 99 (mg/dL)   Comment 1 Notify RN     Comment 2 Documented in Chart    GLUCOSE, CAPILLARY     Status: Abnormal   Collection Time   07/05/11  3:45 PM      Component Value Range   Glucose-Capillary 108 (*) 70 - 99 (mg/dL)  VANCOMYCIN, TROUGH     Status: Abnormal   Collection Time   07/05/11  5:30 PM      Component Value Range   Vancomycin Tr 21.0 (*) 10.0 -  20.0 (ug/mL)  GLUCOSE, CAPILLARY     Status: Abnormal   Collection Time   07/05/11  7:49 PM      Component Value Range   Glucose-Capillary 118 (*) 70 - 99 (mg/dL)  GLUCOSE, CAPILLARY     Status: Abnormal   Collection Time   07/05/11 11:33 PM      Component Value Range   Glucose-Capillary 121 (*) 70 - 99 (mg/dL)  GLUCOSE, CAPILLARY     Status: Abnormal   Collection Time   07/06/11  4:01 AM      Component Value Range   Glucose-Capillary 119 (*) 70 - 99 (mg/dL)  CBC     Status: Abnormal   Collection Time   07/06/11  4:20 AM      Component Value Range   WBC 16.1 (*) 4.0 - 10.5 (K/uL)   RBC 1.98 (*) 3.87 - 5.11 (MIL/uL)   Hemoglobin 5.8 (*) 12.0 - 15.0 (g/dL)   HCT 95.6 (*) 38.7 - 46.0 (%)   MCV 94.4  78.0 - 100.0 (fL)   MCH 29.3  26.0 - 34.0 (pg)   MCHC 31.0  30.0 - 36.0 (g/dL)   RDW 56.4 (*) 33.2 - 15.5 (%)   Platelets 339  150 - 400 (K/uL)  BASIC METABOLIC PANEL     Status: Abnormal   Collection Time   07/06/11  4:20 AM      Component Value Range   Sodium 139  135 - 145 (mEq/L)   Potassium 3.7  3.5 - 5.1 (mEq/L)   Chloride 106  96 - 112 (mEq/L)   CO2 25  19 - 32 (mEq/L)   Glucose, Bld 128 (*) 70 - 99 (mg/dL)   BUN 20  6 - 23 (mg/dL)   Creatinine, Ser 9.51  0.50 - 1.10 (mg/dL)   Calcium 7.8 (*) 8.4 - 10.5 (mg/dL)   GFR calc non Af Amer >90  >90 (mL/min)   GFR calc Af Amer >90  >90 (mL/min)    Assessment & Plan: Present on Admission:  .Open right tibia/fibula fractures .Left tibia/fibula fractures .Lateral ventral hernia .Shock due to trauma .Acute respiratory failure following trauma and surgery .Laceration of right arm with complication   LOS: 9 days   Additional comments:I reviewed the patient's new clinical lab test results.  PHBC  VDRF - did not wean well yesterday, will increase Klonopin and Seroquel R traumatic trans-knee BKA - shrinker per ortho L tib fib Fx s/p ex fix -- possible change  to IM nail per Dr. Carola Frost, ex fix may be definitive Tx as with Hb 5 surgery  would be too risky Left 2nd MC fx -- non-op per Dr. Orlan Leavens ?L adrenal hemorrhage  Multiple abrasions - Xeroform and local care, wound RN R forearm 5th digit extensor tendon injury s/p repair Traumatic hernia R lateral abdominal wall - elective repair  Suicidal/bipolar - psych consult once extubated  ABL anemia - patient is JW - mother declines blood transfusions. Friend says patient not observant, may give consent once she's off the vent. On Aranesp, add iron, I will address again with mother today ID --  TLC out and PICC in, Cx neg so far, Vanc/Zosyn CV -- tachycardia improved Hyperglycemia - SSI  FEN -- tolerating TF VTE - Lovenox Dispo -- VDRF Critical Care Total Time*: 45 Minutes  [name], MD, MPH, FACS Pager: [phone]  07/06/2011  *Care during the described time interval was provided by me and/or other providers on the critical care team.  I have reviewed this patient's available data, including medical history, events of note, physical examination and test results as part of my evaluation.

## 2011-07-06 NOTE — Progress Notes (Signed)
POD 9  Intubated and sedated, responsive but not following commands VSS noted, Hgb < 6 RUEX drsg changed, all wounds looking appropriate, scant drainage RLEx stump shrinker in place, no drainage LLE pin site dressings clean and dry  Plan: Surgery not feasible without transfusion for conversion to IM nail on L Will follow and see again on Monday for drsg changes  Myrene Galas, MD

## 2011-07-06 NOTE — Progress Notes (Signed)
UR complete 

## 2011-07-07 ENCOUNTER — Inpatient Hospital Stay (HOSPITAL_COMMUNITY): Payer: Medicare Other

## 2011-07-07 DIAGNOSIS — Z9889 Other specified postprocedural states: Secondary | ICD-10-CM

## 2011-07-07 LAB — BASIC METABOLIC PANEL
BUN: 19 mg/dL (ref 6–23)
Chloride: 103 mEq/L (ref 96–112)
Creatinine, Ser: 0.78 mg/dL (ref 0.50–1.10)
Glucose, Bld: 139 mg/dL — ABNORMAL HIGH (ref 70–99)
Potassium: 3.9 mEq/L (ref 3.5–5.1)

## 2011-07-07 LAB — GLUCOSE, CAPILLARY
Glucose-Capillary: 125 mg/dL — ABNORMAL HIGH (ref 70–99)
Glucose-Capillary: 126 mg/dL — ABNORMAL HIGH (ref 70–99)
Glucose-Capillary: 125 mg/dL — ABNORMAL HIGH (ref 70–99)
Glucose-Capillary: 135 mg/dL — ABNORMAL HIGH (ref 70–99)
Glucose-Capillary: 128 mg/dL — ABNORMAL HIGH (ref 70–99)

## 2011-07-07 LAB — CBC
HCT: 20.6 % — ABNORMAL LOW (ref 36.0–46.0)
Hemoglobin: 6.3 g/dL — CL (ref 12.0–15.0)
MCV: 94.9 fL (ref 78.0–100.0)
WBC: 20 10*3/uL — ABNORMAL HIGH (ref 4.0–10.5)

## 2011-07-07 MED ORDER — METOPROLOL TARTRATE 25 MG/10 ML ORAL SUSPENSION
12.5000 mg | Freq: Two times a day (BID) | ORAL | Status: DC
Start: 2011-07-07 — End: 2011-07-17
  Administered 2011-07-07 – 2011-07-16 (×17): 12.5 mg via ORAL
  Filled 2011-07-07 (×23): qty 5

## 2011-07-07 MED ORDER — CLONAZEPAM 0.5 MG PO TABS
1.0000 mg | ORAL_TABLET | Freq: Two times a day (BID) | ORAL | Status: DC
  Administered 2011-07-07 – 2011-07-12 (×8): 1 mg
  Filled 2011-07-07 (×7): qty 1
  Filled 2011-07-07: qty 2
  Filled 2011-07-07 (×2): qty 1

## 2011-07-07 MED ORDER — QUETIAPINE FUMARATE 100 MG PO TABS
100.0000 mg | ORAL_TABLET | Freq: Two times a day (BID) | ORAL | Status: DC
  Administered 2011-07-07 – 2011-07-12 (×7): 100 mg
  Filled 2011-07-07 (×14): qty 1

## 2011-07-07 NOTE — Progress Notes (Signed)
Left lower extremity venous duplex completed.  Preliminary report is negative for DVT, SVT, or a Baker's cyst in the left leg.  Negative for DVT in the right common femoral vein. 

## 2011-07-07 NOTE — Progress Notes (Signed)
Patient ID: Tonya [name], female   DOB: 10-Jul-1985, 27 y.o.   MRN: [id number] Members of the trauma team including myself, Dr. Lindie Spruce, our care manager, our social worker, and the patient's nurse met with the patient's mother, 2 brothers, and a clergy member from the local [employer]. We discussed the patient's clinical progress at length. We also discussed plans for the future including possible tracheostomy and PEG tube placement. We also advised him to begin thinking about plans for rehabilitation him the location they would want that to happen in. [name] reaffirmed her wishes that the patient not receive any blood transfusions or blood products. We answered their questions regarding the iron and erythropoietin treatments that are ongoing. Once the patient is extubated, psychiatry will need to be involved closely both for treatment of her bipolar disorder and for determination of competency. [name], MD, MPH, FACS Pager: [phone]

## 2011-07-07 NOTE — Progress Notes (Addendum)
Patient ID: Tonya [name], female   DOB: 11-01-1985, 26 y.o.   MRN: [id number] Follow up - Trauma Critical Care  Patient Details:    Tonya [name] is an 26 y.o. female.  Lines/tubes : AIRWAYS 7.5 mm (Active)  Secured at (cm) 21 cm 07/06/2011  4:00 PM  Measured From Lips 07/06/2011  4:00 PM  Secured Location Right 07/06/2011  4:00 PM  Secured By [employer] 07/06/2011  4:00 PM  Site Condition Dry 07/06/2011  4:00 PM     Airway 7.5 mm (Active)  Secured at (cm) 21 cm 07/07/2011  7:31 AM  Measured From Lips 07/07/2011  7:31 AM  Secured Location Right 07/07/2011  7:31 AM  Secured By [employer] 07/07/2011  7:31 AM  Tube Holder Repositioned Yes 07/07/2011  7:31 AM  Cuff Pressure (cm H2O) 22 cm H2O 07/06/2011 11:57 PM  Site Condition Dry 07/06/2011  4:06 PM     PICC Triple Lumen 07/04/11 PICC Right Brachial (Active)  Length mark (cm) 5 cm 07/04/2011  5:25 PM  Site Assessment Clean;Dry;Intact 07/06/2011  9:00 PM  Lumen #1 Status Infusing;Flushed 07/06/2011  9:00 PM  Lumen #2 Status Infusing;Flushed 07/06/2011  9:00 PM  Lumen #3 Status Flushed;Capped (Central line) 07/06/2011  9:00 PM  Dressing Type Transparent 07/06/2011  9:00 PM  Dressing Status Clean;Dry 07/06/2011 10:00 PM  Dressing Intervention Dressing changed;Antimicrobial disc changed 07/06/2011 10:00 PM  Dressing Change Due 07/12/11 07/06/2011  9:00 PM  Indication for Insertion or Continuance of Line Limited venous access - need for IV therapy >5 days (PICC only);Prolonged intravenous therapies 07/06/2011  9:00 PM     NG/OG Tube Orogastric (Active)  Placement Verification Auscultation 07/06/2011  8:00 PM  Site Assessment Clean;Dry;Intact 07/06/2011  8:00 PM  Status Infusing tube feed;Irrigated 07/06/2011  8:00 PM  Drainage Appearance Bile;Tan 07/05/2011  8:00 PM  Gastric Residual 50 mL 07/06/2011 12:00 AM  Intake (mL) 35 mL 07/07/2011  7:00 AM  Output (mL) 15 mL 07/06/2011  4:00 PM     Urethral Catheter (Active)  Site Assessment Clean;Intact  07/06/2011  8:00 PM  Collection Container Standard drainage bag 07/06/2011  8:00 PM  Securement Method Leg strap 07/06/2011  8:00 PM  Urinary Catheter Interventions Unclamped 07/06/2011  8:00 PM  Indication for Insertion or Continuance of Catheter Urinary output monitoring 07/06/2011  8:00 PM  Output (mL) 150 mL 07/07/2011  6:26 AM    Microbiology/Sepsis markers: Results for orders placed during the hospital encounter of 06/27/11  MRSA PCR SCREENING     Status: Normal   Collection Time   06/27/11  4:45 AM      Component Value Range Status Comment   MRSA by PCR NEGATIVE  NEGATIVE  Final   CULTURE, RESPIRATORY     Status: Normal   Collection Time   06/30/11  8:18 AM      Component Value Range Status Comment   Specimen Description ENDOTRACHEAL ASPIRATE   Final    Special Requests NONE   Final    Gram Stain     Final    Value: RARE WBC PRESENT,BOTH PMN AND MONONUCLEAR     NO SQUAMOUS EPITHELIAL CELLS SEEN     NO ORGANISMS SEEN   Culture NO GROWTH 2 DAYS   Final    Report Status 07/02/2011 FINAL   Final   URINE CULTURE     Status: Normal   Collection Time   06/30/11  9:45 AM      Component Value Range  Status Comment   Specimen Description URINE, CATHETERIZED   Final    Special Requests NONE   Final    Culture  Setup Time [id number]   Final    Colony Count NO GROWTH   Final    Culture NO GROWTH   Final    Report Status 07/01/2011 FINAL   Final   CULTURE, BLOOD (ROUTINE X 2)     Status: Normal   Collection Time   06/30/11 10:30 AM      Component Value Range Status Comment   Specimen Description BLOOD ARM LEFT   Final    Special Requests BOTTLES DRAWN AEROBIC ONLY 8.0CC ONLY    Final    Culture  Setup Time [id number]   Final    Culture NO GROWTH 5 DAYS   Final    Report Status 07/06/2011 FINAL   Final   CULTURE, BLOOD (ROUTINE X 2)     Status: Normal (Preliminary result)   Collection Time   07/03/11  9:10 AM      Component Value Range Status Comment   Specimen Description BLOOD LEFT ARM    Final    Special Requests BOTTLES DRAWN AEROBIC AND ANAEROBIC 10CC   Final    Culture  Setup Time [id number]   Final    Culture     Final    Value:        BLOOD CULTURE RECEIVED NO GROWTH TO DATE CULTURE WILL BE HELD FOR 5 DAYS BEFORE ISSUING A FINAL NEGATIVE REPORT   Report Status PENDING   Incomplete   CULTURE, BLOOD (ROUTINE X 2)     Status: Normal (Preliminary result)   Collection Time   07/03/11  9:30 AM      Component Value Range Status Comment   Specimen Description BLOOD CENTRAL LINE   Final    Special Requests     Final    Value: BOTTLES DRAWN AEROBIC AND ANAEROBIC 10CC CENTRAL LINE IJ   Culture  Setup Time [id number]   Final    Culture     Final    Value:        BLOOD CULTURE RECEIVED NO GROWTH TO DATE CULTURE WILL BE HELD FOR 5 DAYS BEFORE ISSUING A FINAL NEGATIVE REPORT   Report Status PENDING   Incomplete   CULTURE, BAL-QUANTITATIVE     Status: Normal   Collection Time   07/03/11 10:20 AM      Component Value Range Status Comment   Specimen Description BRONCHIAL ALVEOLAR LAVAGE   Final    Special Requests NONE   Final    Gram Stain     Final    Value: FEW WBC PRESENT,BOTH PMN AND MONONUCLEAR     NO ORGANISMS SEEN     NO ORGANISMS SEEN   Colony Count NO GROWTH   Final    Culture NO GROWTH 2 DAYS   Final    Report Status 07/05/2011 FINAL   Final     Anti-infectives:  Anti-infectives     Start     Dose/Rate Route Frequency Ordered Stop   07/06/11 0200   vancomycin (VANCOCIN) IVPB 1000 mg/200 mL premix        1,000 mg 200 mL/hr over 60 Minutes Intravenous Every 8 hours 07/05/11 1920     07/04/11 1000   vancomycin (VANCOCIN) 1,250 mg in sodium chloride 0.9 % 250 mL IVPB  Status:  Discontinued        1,250 mg 166.7 mL/hr over 90 Minutes Intravenous Every  8 hours 07/04/11 0807 07/05/11 1920   07/04/11 0900  piperacillin-tazobactam (ZOSYN) IVPB 3.375 g       3.375 g 12.5 mL/hr over 240 Minutes Intravenous 3 times per day 07/04/11 0748     06/27/11 1400   ceFAZolin  (ANCEF) IVPB 1 g/50 mL premix  Status:  Discontinued        1 g 100 mL/hr over 30 Minutes Intravenous 3 times per day 06/27/11 1056 07/04/11 0748   06/27/11 1300   gentamicin (GARAMYCIN) 530 mg in dextrose 5 % 100 mL IVPB  Status:  Discontinued        530 mg 113.3 mL/hr over 60 Minutes Intravenous Every 24 hours 06/27/11 1214 07/04/11 0748          Best Practice/Protocols:  VTE Prophylaxis: Lovenox (prophylaxtic dose) Continous Sedation  Consults: Treatment Team:  Budd Palmer, MD Sharma Covert, MD    Studies:   Dg Chest Port 1 View  07/10/2011  *RADIOLOGY REPORT*  Clinical Data: Endotracheal tube position.  PORTABLE CHEST - 1 VIEW  Comparison: 07/05/2011.  Findings: The endotracheal tube and NG tubes are stable. Persistent low lung volumes with vascular crowding and bibasilar atelectasis.  No large pleural effusion or pneumothorax.  Slight improved perihilar aeration.  IMPRESSION:  1.  Stable support apparatus. 2.  Improved perihilar aeration. 3.  Persistent bibasilar atelectasis and small effusions.  Original Report Authenticated By: P. Loralie Champagne, M.D.       Events:  Subjective:    Overnight Issues:   Objective:  Vital signs for last 24 hours: Temp:  [97.5 F (36.4 C)-100.9 F (38.3 C)] 100.2 F (37.9 C) 07-10-22 0732) Pulse Rate:  [106-152] 108  2022-07-10 0731) Resp:  [17-37] 17  2022/07/10 0731) BP: (110-131)/(49-78) 121/78 mmHg 2022-07-10 0731) SpO2:  [87 %-99 %] 99 % Jul 10, 2022 0731) FiO2 (%):  [35 %-40.4 %] 40 % 2022-07-10 0731) Weight:  [116.937 kg (257 lb 12.8 oz)-118.6 kg (261 lb 7.5 oz)] 116.937 kg (257 lb 12.8 oz) 2022/07/10 0500)  Hemodynamic parameters for last 24 hours:    Intake/Output from previous day: 06/06 0701 - 07/10/22 0700 In: 1610.9 [I.V.:1998.2; NG/GT:1110; IV Piggyback:560] Out: 2720 [Urine:2635; Emesis/NG output:85]  Intake/Output this shift:    Vent settings for last 24 hours: Vent Mode:  [-] PRVC FiO2 (%):  [35 %-40.4 %] 40 % Set Rate:  [18  bmp] 18 bmp Vt Set:  [500 mL-550 mL] 550 mL PEEP:  [5 cmH20] 5 cmH20 Pressure Support:  [14 cmH20] 14 cmH20 Plateau Pressure:  [20 cmH20-24 cmH20] 23 cmH20  Physical Exam:  General: on vent Neuro: F/C with BUE and LLE HEENT/Neck: collar on Resp: clear to auscultation bilaterally CVS: reg 110 GI: soft, NT, ND, +BS EXT: stump shrinker RLE, ex fix LLE, dressings BUE, abrasions healing  Results for orders placed during the hospital encounter of 06/27/11 (from the past 24 hour(s))  GLUCOSE, CAPILLARY     Status: Abnormal   Collection Time   07/06/11 12:37 PM      Component Value Range   Glucose-Capillary 120 (*) 70 - 99 (mg/dL)  GLUCOSE, CAPILLARY     Status: Abnormal   Collection Time   07/06/11  1:08 PM      Component Value Range   Glucose-Capillary 133 (*) 70 - 99 (mg/dL)   Comment 1 Documented in Chart     Comment 2 Notify RN    GLUCOSE, CAPILLARY     Status: Abnormal   Collection Time   07/06/11  3:26 PM      Component Value Range   Glucose-Capillary 125 (*) 70 - 99 (mg/dL)  GLUCOSE, CAPILLARY     Status: Abnormal   Collection Time   07/06/11  3:51 PM      Component Value Range   Glucose-Capillary 119 (*) 70 - 99 (mg/dL)   Comment 1 Notify RN    GLUCOSE, CAPILLARY     Status: Abnormal   Collection Time   07/06/11  7:34 PM      Component Value Range   Glucose-Capillary 115 (*) 70 - 99 (mg/dL)  GLUCOSE, CAPILLARY     Status: Abnormal   Collection Time   07/06/11 11:19 PM      Component Value Range   Glucose-Capillary 136 (*) 70 - 99 (mg/dL)  GLUCOSE, CAPILLARY     Status: Abnormal   Collection Time   07/07/11  3:56 AM      Component Value Range   Glucose-Capillary 125 (*) 70 - 99 (mg/dL)  CBC     Status: Abnormal   Collection Time   07/07/11  4:00 AM      Component Value Range   WBC 20.0 (*) 4.0 - 10.5 (K/uL)   RBC 2.17 (*) 3.87 - 5.11 (MIL/uL)   Hemoglobin 6.3 (*) 12.0 - 15.0 (g/dL)   HCT 78.2 (*) 95.6 - 46.0 (%)   MCV 94.9  78.0 - 100.0 (fL)   MCH 29.0  26.0 - 34.0 (pg)    MCHC 30.6  30.0 - 36.0 (g/dL)   RDW 21.3 (*) 08.6 - 15.5 (%)   Platelets 401 (*) 150 - 400 (K/uL)  BASIC METABOLIC PANEL     Status: Abnormal   Collection Time   07/07/11  4:00 AM      Component Value Range   Sodium 137  135 - 145 (mEq/L)   Potassium 3.9  3.5 - 5.1 (mEq/L)   Chloride 103  96 - 112 (mEq/L)   CO2 24  19 - 32 (mEq/L)   Glucose, Bld 139 (*) 70 - 99 (mg/dL)   BUN 19  6 - 23 (mg/dL)   Creatinine, Ser 5.78  0.50 - 1.10 (mg/dL)   Calcium 7.9 (*) 8.4 - 10.5 (mg/dL)   GFR calc non Af Amer >90  >90 (mL/min)   GFR calc Af Amer >90  >90 (mL/min)    Assessment & Plan: Present on Admission:  .Open right tibia/fibula fractures .Left tibia/fibula fractures .Lateral ventral hernia .Shock due to trauma .Acute respiratory failure following trauma and surgery .Laceration of right arm with complication   LOS: 10 days   Additional comments:I reviewed the patient's new clinical lab test results. and CXR PHBC  VDRF - weaned better (14/5) on increased Klonopin and Seroquel - will increase further R traumatic trans-knee BKA - shrinker per ortho L tib fib Fx s/p ex fix -- cannot change to IM nail without t-fusion per Dr. Carola Frost, continue ex fix Left 2nd MC fx -- non-op per Dr. Orlan Leavens ?L adrenal hemorrhage  Multiple abrasions - Xeroform and local care, wound RN, improving R forearm 5th digit extensor tendon injury s/p repair Traumatic hernia R lateral abdominal wall - elective repair  Suicidal/bipolar - psych consult once extubated  ABL anemia - patient is JW - mother declines blood transfusions. Trauma team is meeting with mother today at 1300,. On Aranesp, added iron also.  Hb up slightly ID -- fever better but WBC up to 20k, TLC out and PICC in, Cx neg so far, Vanc/Zosyn CV --  tachycardia at times - Add low dose lopressor Hyperglycemia - SSI  FEN -- tolerating TF VTE - Lovenox, check duplex LLE Dispo -- VDRF Critical Care Total Time*: 45 Minutes  [name], MD, MPH,  FACS Pager: [phone]  07/07/2011  *Care during the described time interval was provided by me and/or other providers on the critical care team.  I have reviewed this patient's available data, including medical history, events of note, physical examination and test results as part of my evaluation.

## 2011-07-07 NOTE — Clinical Social Work Note (Signed)
Clinical Social Worker, Dr. Lindie Spruce, Dr. Janee Morn, case manager Marcelino Duster) and the patient's nurse met with the patient's mother, 2 brothers Barbara Cower and [place]), and a clergy member from the local [employer].  Patient family made it very clear prior to the meeting that patient fiance/boyfriend not be involved in family discussion.  We discussed the patient's clinical progress at length. We also discussed plans for the future including possible tracheostomy and PEG tube placement. We also advised him to begin thinking about plans for rehabilitation options in the location of their preference if able.  Patient family seems to be realistic and understand that patient psychiatric state may determine patient level of rehab at that time. [name] reaffirmed her wishes that the patient not receive any blood transfusions or blood products due to their current faith.   Once the patient is extubated, psychiatry will need to be involved closely both for treatment of her bipolar disorder and for determination of competency.  Clinical Social Worker will update Psych CSW once involved in patient care and disposition.  Patient's family seem to be realistic at this point in time in patient plan of care.  CSW will continue to follow closely for emotional support and discharge planning needs.  [address] [place], [place] [phone]

## 2011-07-08 LAB — CBC
MCV: 94.2 fL (ref 78.0–100.0)
Platelets: 360 10*3/uL (ref 150–400)
RBC: 2.06 MIL/uL — ABNORMAL LOW (ref 3.87–5.11)
RDW: 16.4 % — ABNORMAL HIGH (ref 11.5–15.5)
WBC: 17.2 10*3/uL — ABNORMAL HIGH (ref 4.0–10.5)

## 2011-07-08 LAB — IRON AND TIBC
Iron: 29 ug/dL — ABNORMAL LOW (ref 42–135)
Saturation Ratios: 13 % — ABNORMAL LOW (ref 20–55)
TIBC: 215 ug/dL — ABNORMAL LOW (ref 250–470)

## 2011-07-08 LAB — BASIC METABOLIC PANEL
Chloride: 102 mEq/L (ref 96–112)
Creatinine, Ser: 0.69 mg/dL (ref 0.50–1.10)
GFR calc Af Amer: >90 mL/min (ref >90)
Potassium: 3.5 mEq/L (ref 3.5–5.1)
Sodium: 136 mEq/L (ref 135–145)

## 2011-07-08 LAB — GLUCOSE, CAPILLARY
Glucose-Capillary: 117 mg/dL — ABNORMAL HIGH (ref 70–99)
Glucose-Capillary: 136 mg/dL — ABNORMAL HIGH (ref 70–99)
Glucose-Capillary: 116 mg/dL — ABNORMAL HIGH (ref 70–99)
Glucose-Capillary: 136 mg/dL — ABNORMAL HIGH (ref 70–99)

## 2011-07-08 LAB — VITAMIN B12: Vitamin B-12: 493 pg/mL (ref 211–911)

## 2011-07-08 LAB — FOLATE: Folate: 4.6 ng/mL

## 2011-07-08 LAB — RETICULOCYTES: Retic Ct Pct: 11.1 % — ABNORMAL HIGH (ref 0.4–3.1)

## 2011-07-08 NOTE — Progress Notes (Signed)
Follow up - Trauma and Critical Care  Patient Details:    Tonya [name] is an 26 y.o. female.  Lines/tubes : AIRWAYS 7.5 mm (Active)  Secured at (cm) 21 cm 07/06/2011  4:00 PM  Measured From Lips 07/06/2011  4:00 PM  Secured Location Right 07/06/2011  4:00 PM  Secured By [employer] 07/06/2011  4:00 PM  Site Condition Dry 07/06/2011  4:00 PM     Airway 7.5 mm (Active)  Secured at (cm) 19 cm 07/08/2011  8:07 AM  Measured From Lips 07/08/2011  8:07 AM  Secured Location Left 07/08/2011  8:07 AM  Secured By [employer] 07/08/2011  8:07 AM  Tube Holder Repositioned Yes 07/08/2011  8:07 AM  Cuff Pressure (cm H2O) 22 cm H2O 07/06/2011 11:57 PM  Site Condition Dry 07/06/2011  4:06 PM     PICC Triple Lumen 07/04/11 PICC Right Brachial (Active)  Length mark (cm) 5 cm 07/04/2011  5:25 PM  Site Assessment Clean;Dry;Intact 07/08/2011  7:45 AM  Lumen #1 Status Infusing 07/08/2011  7:45 AM  Lumen #2 Status Infusing 07/08/2011  7:45 AM  Lumen #3 Status Capped (Central line) 07/08/2011  7:45 AM  Dressing Type Transparent 07/08/2011  7:45 AM  Dressing Status Clean;Dry;Intact;Antimicrobial disc in place 07/08/2011  7:45 AM  Line Care Connections checked and tightened 07/08/2011  7:45 AM  Dressing Intervention Dressing changed;Antimicrobial disc changed 07/06/2011 10:00 PM  Dressing Change Due 07/12/11 07/08/2011  7:45 AM  Indication for Insertion or Continuance of Line Limited venous access - need for IV therapy >5 days (PICC only) 07/08/2011  7:45 AM     NG/OG Tube Orogastric (Active)  Placement Verification Auscultation 07/08/2011  7:45 AM  Site Assessment Clean;Dry;Intact 07/08/2011  7:45 AM  Status Infusing tube feed;Irrigated 07/08/2011  7:45 AM  Drainage Appearance Bile;Tan 07/07/2011  8:00 PM  Gastric Residual 15 mL 07/07/2011  8:00 PM  Intake (mL) 35 mL 07/08/2011  9:00 AM  Output (mL) 15 mL 07/06/2011  4:00 PM     Urethral Catheter (Active)  Site Assessment Clean;Intact 07/08/2011  7:45 AM  Collection Container  Standard drainage bag 07/08/2011  7:45 AM  Securement Method Leg strap 07/08/2011  7:45 AM  Urinary Catheter Interventions Unclamped 07/08/2011  7:45 AM  Indication for Insertion or Continuance of Catheter Urinary output monitoring 07/08/2011  7:45 AM  Output (mL) 150 mL 07/08/2011  9:00 AM    Microbiology/Sepsis markers: Results for orders placed during the hospital encounter of 06/27/11  MRSA PCR SCREENING     Status: Normal   Collection Time   06/27/11  4:45 AM      Component Value Range Status Comment   MRSA by PCR NEGATIVE  NEGATIVE  Final   CULTURE, RESPIRATORY     Status: Normal   Collection Time   06/30/11  8:18 AM      Component Value Range Status Comment   Specimen Description ENDOTRACHEAL ASPIRATE   Final    Special Requests NONE   Final    Gram Stain     Final    Value: RARE WBC PRESENT,BOTH PMN AND MONONUCLEAR     NO SQUAMOUS EPITHELIAL CELLS SEEN     NO ORGANISMS SEEN   Culture NO GROWTH 2 DAYS   Final    Report Status 07/02/2011 FINAL   Final   URINE CULTURE     Status: Normal   Collection Time   06/30/11  9:45 AM      Component Value Range Status  Comment   Specimen Description URINE, CATHETERIZED   Final    Special Requests NONE   Final    Culture  Setup Time [id number]   Final    Colony Count NO GROWTH   Final    Culture NO GROWTH   Final    Report Status 07/01/2011 FINAL   Final   CULTURE, BLOOD (ROUTINE X 2)     Status: Normal   Collection Time   06/30/11 10:30 AM      Component Value Range Status Comment   Specimen Description BLOOD ARM LEFT   Final    Special Requests BOTTLES DRAWN AEROBIC ONLY 8.0CC ONLY    Final    Culture  Setup Time [id number]   Final    Culture NO GROWTH 5 DAYS   Final    Report Status 07/06/2011 FINAL   Final   CULTURE, BLOOD (ROUTINE X 2)     Status: Normal (Preliminary result)   Collection Time   07/03/11  9:10 AM      Component Value Range Status Comment   Specimen Description BLOOD LEFT ARM   Final    Special Requests BOTTLES DRAWN  AEROBIC AND ANAEROBIC 10CC   Final    Culture  Setup Time [id number]   Final    Culture     Final    Value:        BLOOD CULTURE RECEIVED NO GROWTH TO DATE CULTURE WILL BE HELD FOR 5 DAYS BEFORE ISSUING A FINAL NEGATIVE REPORT   Report Status PENDING   Incomplete   CULTURE, BLOOD (ROUTINE X 2)     Status: Normal (Preliminary result)   Collection Time   07/03/11  9:30 AM      Component Value Range Status Comment   Specimen Description BLOOD CENTRAL LINE   Final    Special Requests     Final    Value: BOTTLES DRAWN AEROBIC AND ANAEROBIC 10CC CENTRAL LINE IJ   Culture  Setup Time [id number]   Final    Culture     Final    Value:        BLOOD CULTURE RECEIVED NO GROWTH TO DATE CULTURE WILL BE HELD FOR 5 DAYS BEFORE ISSUING A FINAL NEGATIVE REPORT   Report Status PENDING   Incomplete   CULTURE, BAL-QUANTITATIVE     Status: Normal   Collection Time   07/03/11 10:20 AM      Component Value Range Status Comment   Specimen Description BRONCHIAL ALVEOLAR LAVAGE   Final    Special Requests NONE   Final    Gram Stain     Final    Value: FEW WBC PRESENT,BOTH PMN AND MONONUCLEAR     NO ORGANISMS SEEN     NO ORGANISMS SEEN   Colony Count NO GROWTH   Final    Culture NO GROWTH 2 DAYS   Final    Report Status 07/05/2011 FINAL   Final     Anti-infectives:  Anti-infectives     Start     Dose/Rate Route Frequency Ordered Stop   07/06/11 0200   vancomycin (VANCOCIN) IVPB 1000 mg/200 mL premix        1,000 mg 200 mL/hr over 60 Minutes Intravenous Every 8 hours 07/05/11 1920     07/04/11 1000   vancomycin (VANCOCIN) 1,250 mg in sodium chloride 0.9 % 250 mL IVPB  Status:  Discontinued        1,250 mg 166.7 mL/hr over 90 Minutes Intravenous Every 8  hours 07/04/11 0807 07/05/11 1920   07/04/11 0900  piperacillin-tazobactam (ZOSYN) IVPB 3.375 g       3.375 g 12.5 mL/hr over 240 Minutes Intravenous 3 times per day 07/04/11 0748     06/27/11 1400   ceFAZolin (ANCEF) IVPB 1 g/50 mL premix  Status:   Discontinued        1 g 100 mL/hr over 30 Minutes Intravenous 3 times per day 06/27/11 1056 07/04/11 0748   06/27/11 1300   gentamicin (GARAMYCIN) 530 mg in dextrose 5 % 100 mL IVPB  Status:  Discontinued        530 mg 113.3 mL/hr over 60 Minutes Intravenous Every 24 hours 06/27/11 1214 07/04/11 0748          Best Practice/Protocols:  VTE Prophylaxis: Lovenox (prophylaxtic dose) GI Prophylaxis: Proton Pump Inhibitor Intermittent Sedation  Consults: Treatment Team:  Budd Palmer, MD Sharma Covert, MD    Events:  Subjective:    Overnight Issues: She is about as calm as I have ever seen her today.  Secretions are mostly oral.  Very thin.  Did not do well with weaning earlier this morning.  Objective:  Vital signs for last 24 hours: Temp:  [98.2 F (36.8 C)-100.5 F (38.1 C)] 98.8 F (37.1 C) (06/08 0709) Pulse Rate:  [98-120] 109  (06/08 0900) Resp:  [15-39] 18  (06/08 0807) BP: (97-126)/(44-106) 114/60 mmHg (06/08 0900) SpO2:  [93 %-100 %] 97 % (06/08 0900) FiO2 (%):  [39.4 %-40.3 %] 39.9 % (06/08 0900) Weight:  [117.572 kg (259 lb 3.2 oz)] 117.572 kg (259 lb 3.2 oz) (06/08 0500)  Hemodynamic parameters for last 24 hours: CVP:  [11 mmHg-16 mmHg] 16 mmHg  Intake/Output from previous day: 06/07 0701 - 06/08 0700 In: 3721.5 [I.V.:1871.5; NG/GT:1090; IV Piggyback:760] Out: 2200 [Urine:2200]  Intake/Output this shift: Total I/O In: 303 [I.V.:158; NG/GT:145] Out: 180 [Urine:180]  Vent settings for last 24 hours: Vent Mode:  [-] PRVC FiO2 (%):  [39.4 %-40.3 %] 39.9 % Set Rate:  [18 bmp] 18 bmp Vt Set:  [550 mL] 550 mL PEEP:  [5 cmH20] 5 cmH20 Plateau Pressure:  [21 cmH20-29 cmH20] 23 cmH20  Physical Exam:  General: alert, no respiratory distress and appropriate and seemingly oriented. Neuro: alert, oriented and nonfocal exam Resp: clear to auscultation bilaterally and minimal secretions from ETT CVS: mild sinus tachycardia. GI: soft, nontender, BS WNL,  no r/g and good bowel sounds and tolerating tube feedings very well. Extremities: amputation present and good pulses in left foot.  Results for orders placed during the hospital encounter of 06/27/11 (from the past 24 hour(s))  GLUCOSE, CAPILLARY     Status: Abnormal   Collection Time   07/07/11 11:23 AM      Component Value Range   Glucose-Capillary 125 (*) 70 - 99 (mg/dL)  GLUCOSE, CAPILLARY     Status: Abnormal   Collection Time   07/07/11  3:27 PM      Component Value Range   Glucose-Capillary 135 (*) 70 - 99 (mg/dL)  GLUCOSE, CAPILLARY     Status: Abnormal   Collection Time   07/07/11  7:23 PM      Component Value Range   Glucose-Capillary 128 (*) 70 - 99 (mg/dL)   Comment 1 Notify RN    GLUCOSE, CAPILLARY     Status: Abnormal   Collection Time   07/07/11 10:58 PM      Component Value Range   Glucose-Capillary 134 (*) 70 - 99 (mg/dL)  GLUCOSE, CAPILLARY     Status: Abnormal   Collection Time   07/08/11  3:24 AM      Component Value Range   Glucose-Capillary 117 (*) 70 - 99 (mg/dL)  CBC     Status: Abnormal   Collection Time   07/08/11  4:10 AM      Component Value Range   WBC 17.2 (*) 4.0 - 10.5 (K/uL)   RBC 2.06 (*) 3.87 - 5.11 (MIL/uL)   Hemoglobin 6.1 (*) 12.0 - 15.0 (g/dL)   HCT 40.9 (*) 81.1 - 46.0 (%)   MCV 94.2  78.0 - 100.0 (fL)   MCH 29.6  26.0 - 34.0 (pg)   MCHC 31.4  30.0 - 36.0 (g/dL)   RDW 91.4 (*) 78.2 - 15.5 (%)   Platelets 360  150 - 400 (K/uL)  BASIC METABOLIC PANEL     Status: Abnormal   Collection Time   07/08/11  4:10 AM      Component Value Range   Sodium 136  135 - 145 (mEq/L)   Potassium 3.5  3.5 - 5.1 (mEq/L)   Chloride 102  96 - 112 (mEq/L)   CO2 25  19 - 32 (mEq/L)   Glucose, Bld 125 (*) 70 - 99 (mg/dL)   BUN 18  6 - 23 (mg/dL)   Creatinine, Ser 9.56  0.50 - 1.10 (mg/dL)   Calcium 7.8 (*) 8.4 - 10.5 (mg/dL)   GFR calc non Af Amer >90  >90 (mL/min)   GFR calc Af Amer >90  >90 (mL/min)  GLUCOSE, CAPILLARY     Status: Abnormal   Collection Time     07/08/11  7:06 AM      Component Value Range   Glucose-Capillary 143 (*) 70 - 99 (mg/dL)   Comment 1 Notify RN       Assessment/Plan:   NEURO  Altered Mental Status:  agitation and sedation   Plan: Continue with low  Grade sedation.  Try to get patient on her psychiatric medications as soon as possible  PULM  Atelectasis/collapse (bibasilar)   Plan: Continue to try to wean as long as it does not hurt her.  CARDIO  Sinus Tachycardia   Plan: No specific treatment  RENAL  No abnormality   Plan: CPM  GI  No problems, tolerating tube feeding very well.   Plan: CPM  ID  No infectious sources.  WBC down a bit.   Plan: CPM  HEME  Anemia acute blood loss anemia, anemia of chronic disease, anemia of critical illness and Jehovah's witness.  Hgb down a bit from yesterday, 6.3 to 6.1.  Will not recheck until Monday.) Leukocytosis (neutrophilia)   Plan: CPM  ENDO No specific issues   Plan: Non changes  Global Issues  Considering everything this patient is doing quite well.  She is much more cooperative today, although not weaning extremely well, with lowl volumes.  Considering her psychiatric condition, she is very cooperative currently and would like to take advantage of this to try to get her extubated within the next couple of day.    LOS: 11 days   Additional comments:I reviewed the patient's new clinical lab test results. cbc/bmet  Critical Care Total Time*: 30 Minutes  Mohammed Mcandrew III,Kortney Schoenfelder O 07/08/2011  *Care during the described time interval was provided by me and/or other providers on the critical care team.  I have reviewed this patient's available data, including medical history, events of note, physical examination and test results as part of my  evaluation.

## 2011-07-08 NOTE — Progress Notes (Signed)
ANTIBIOTIC CONSULT NOTE - FOLLOW UP  Pharmacy Consult for Vancomycin; Aranesp Indication: Empiric coverage s/p open L-tib-fib fracture with persistent fevers; Acute blood loss anemia  No Known Allergies  Patient Measurements: Height: 5\' 3"  (160 cm) Weight: 259 lb 3.2 oz (117.572 kg) IBW/kg (Calculated) : 52.4  Vital Signs: Temp: 98.8 F (37.1 C) (06/08 0709) Temp src: Axillary (06/08 0709) BP: 107/54 mmHg (06/08 1000) Pulse Rate: 113  (06/08 1000) Intake/Output from previous day: 06/07 0701 - 06/08 0700 In: 3721.5 [I.V.:1871.5; NG/GT:1090; IV Piggyback:760] Out: 2200 [Urine:2200] Intake/Output from this shift: Total I/O In: 417 [I.V.:237; NG/GT:180] Out: 180 [Urine:180]  Labs:  Basename 07/08/11 0410 07/07/11 0400 07/06/11 0420  WBC 17.2* 20.0* 16.1*  HGB 6.1* 6.3* 5.8*  PLT 360 401* 339  LABCREA -- -- --  CREATININE 0.69 0.78 0.84   Estimated Creatinine Clearance: 132.1 ml/min (by C-G formula based on Cr of 0.69).  Basename 07/05/11 1730  VANCOTROUGH 21.0*  VANCOPEAK --  Drue Dun --  GENTTROUGH --  GENTPEAK --  GENTRANDOM --  TOBRATROUGH --  TOBRAPEAK --  TOBRARND --  AMIKACINPEAK --  AMIKACINTROU --  AMIKACIN --     Microbiology: Recent Results (from the past 720 hour(s))  MRSA PCR SCREENING     Status: Normal   Collection Time   06/27/11  4:45 AM      Component Value Range Status Comment   MRSA by PCR NEGATIVE  NEGATIVE  Final   CULTURE, RESPIRATORY     Status: Normal   Collection Time   06/30/11  8:18 AM      Component Value Range Status Comment   Specimen Description ENDOTRACHEAL ASPIRATE   Final    Special Requests NONE   Final    Gram Stain     Final    Value: RARE WBC PRESENT,BOTH PMN AND MONONUCLEAR     NO SQUAMOUS EPITHELIAL CELLS SEEN     NO ORGANISMS SEEN   Culture NO GROWTH 2 DAYS   Final    Report Status 07/02/2011 FINAL   Final   URINE CULTURE     Status: Normal   Collection Time   06/30/11  9:45 AM      Component Value Range  Status Comment   Specimen Description URINE, CATHETERIZED   Final    Special Requests NONE   Final    Culture  Setup Time [id number]   Final    Colony Count NO GROWTH   Final    Culture NO GROWTH   Final    Report Status 07/01/2011 FINAL   Final   CULTURE, BLOOD (ROUTINE X 2)     Status: Normal   Collection Time   06/30/11 10:30 AM      Component Value Range Status Comment   Specimen Description BLOOD ARM LEFT   Final    Special Requests BOTTLES DRAWN AEROBIC ONLY 8.0CC ONLY    Final    Culture  Setup Time [id number]   Final    Culture NO GROWTH 5 DAYS   Final    Report Status 07/06/2011 FINAL   Final   CULTURE, BLOOD (ROUTINE X 2)     Status: Normal (Preliminary result)   Collection Time   07/03/11  9:10 AM      Component Value Range Status Comment   Specimen Description BLOOD LEFT ARM   Final    Special Requests BOTTLES DRAWN AEROBIC AND ANAEROBIC 10CC   Final    Culture  Setup Time [id number]   Final  Culture     Final    Value:        BLOOD CULTURE RECEIVED NO GROWTH TO DATE CULTURE WILL BE HELD FOR 5 DAYS BEFORE ISSUING A FINAL NEGATIVE REPORT   Report Status PENDING   Incomplete   CULTURE, BLOOD (ROUTINE X 2)     Status: Normal (Preliminary result)   Collection Time   07/03/11  9:30 AM      Component Value Range Status Comment   Specimen Description BLOOD CENTRAL LINE   Final    Special Requests     Final    Value: BOTTLES DRAWN AEROBIC AND ANAEROBIC 10CC CENTRAL LINE IJ   Culture  Setup Time [id number]   Final    Culture     Final    Value:        BLOOD CULTURE RECEIVED NO GROWTH TO DATE CULTURE WILL BE HELD FOR 5 DAYS BEFORE ISSUING A FINAL NEGATIVE REPORT   Report Status PENDING   Incomplete   CULTURE, BAL-QUANTITATIVE     Status: Normal   Collection Time   07/03/11 10:20 AM      Component Value Range Status Comment   Specimen Description BRONCHIAL ALVEOLAR LAVAGE   Final    Special Requests NONE   Final    Gram Stain     Final    Value: FEW WBC PRESENT,BOTH  PMN AND MONONUCLEAR     NO ORGANISMS SEEN     NO ORGANISMS SEEN   Colony Count NO GROWTH   Final    Culture NO GROWTH 2 DAYS   Final    Report Status 07/05/2011 FINAL   Final     Anti-infectives     Start     Dose/Rate Route Frequency Ordered Stop   07/06/11 0200   vancomycin (VANCOCIN) IVPB 1000 mg/200 mL premix        1,000 mg 200 mL/hr over 60 Minutes Intravenous Every 8 hours 07/05/11 1920     07/04/11 1000   vancomycin (VANCOCIN) 1,250 mg in sodium chloride 0.9 % 250 mL IVPB  Status:  Discontinued        1,250 mg 166.7 mL/hr over 90 Minutes Intravenous Every 8 hours 07/04/11 0807 07/05/11 1920   07/04/11 0900  piperacillin-tazobactam (ZOSYN) IVPB 3.375 g       3.375 g 12.5 mL/hr over 240 Minutes Intravenous 3 times per day 07/04/11 0748     06/27/11 1400   ceFAZolin (ANCEF) IVPB 1 g/50 mL premix  Status:  Discontinued        1 g 100 mL/hr over 30 Minutes Intravenous 3 times per day 06/27/11 1056 07/04/11 0748   06/27/11 1300   gentamicin (GARAMYCIN) 530 mg in dextrose 5 % 100 mL IVPB  Status:  Discontinued        530 mg 113.3 mL/hr over 60 Minutes Intravenous Every 24 hours 06/27/11 1214 07/04/11 0748         Assessment: 26 yo female with persistent fevers on empiric antibiotics. Cx negative so far. Vancomycin and Zosyn. Zosyn dose ok for renal fx. Vancomycin trough 6/5 = 21 - slightly > goal >> Dose decreased for goal trough ~18. Renal fx stable.   Aranesp for acute blood loss anemia: Hgb 6.1 (goal 11-12). No blood products. Iron TID. Retic count 5/31 =79.2 Dose ordered: SQ qweek x 3 doses. Doses given: 5/29, 6/5. Next dose due 6/12.  Renal function stable.   Goal of Therapy:  Vancomycin trough level 15-20 mcg/ml Hgb  11-12  Plan:  1. Check anemia panel including a retic count.  2. May need IV iron replacement if family willing per Trauma.  3. No change to Aranesp dosing at this time.  4. Continue Vancomycin 1g IV q8h - Recheck vancomycin trough next  week.   Fayne Norrie 07/08/2011,11:00 AM

## 2011-07-09 LAB — GLUCOSE, CAPILLARY
Glucose-Capillary: 131 mg/dL — ABNORMAL HIGH (ref 70–99)
Glucose-Capillary: 114 mg/dL — ABNORMAL HIGH (ref 70–99)
Glucose-Capillary: 110 mg/dL — ABNORMAL HIGH (ref 70–99)
Glucose-Capillary: 108 mg/dL — ABNORMAL HIGH (ref 70–99)

## 2011-07-09 LAB — CULTURE, BLOOD (ROUTINE X 2)
Culture  Setup Time: 201306031330
Culture: NO GROWTH
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
## 2011-07-10 ENCOUNTER — Inpatient Hospital Stay (HOSPITAL_COMMUNITY): Payer: Medicare Other

## 2011-07-10 LAB — DIFFERENTIAL
Eosinophils Absolute: 0.5 10*3/uL (ref 0.0–0.7)
Lymphs Abs: 2.3 10*3/uL (ref 0.7–4.0)
Monocytes Relative: 5 % (ref 3–12)
Neutrophils Relative %: 79 % — ABNORMAL HIGH (ref 43–77)

## 2011-07-10 LAB — GLUCOSE, CAPILLARY
Glucose-Capillary: 127 mg/dL — ABNORMAL HIGH (ref 70–99)
Glucose-Capillary: 116 mg/dL — ABNORMAL HIGH (ref 70–99)

## 2011-07-10 LAB — CBC
Hemoglobin: 7.1 g/dL — ABNORMAL LOW (ref 12.0–15.0)
MCH: 29.1 pg (ref 26.0–34.0)
MCV: 95.1 fL (ref 78.0–100.0)
RBC: 2.44 MIL/uL — ABNORMAL LOW (ref 3.87–5.11)

## 2011-07-10 MED ORDER — HALOPERIDOL LACTATE 5 MG/ML IJ SOLN
4.0000 mg | INTRAMUSCULAR | Status: DC | PRN
  Administered 2011-07-10: 4 mg via INTRAVENOUS

## 2011-07-10 MED ORDER — ALBUTEROL SULFATE (5 MG/ML) 0.5% IN NEBU
2.5000 mg | INHALATION_SOLUTION | Freq: Three times a day (TID) | RESPIRATORY_TRACT | Status: DC
  Administered 2011-07-10 – 2011-07-12 (×6): 2.5 mg via RESPIRATORY_TRACT
  Filled 2011-07-10 (×6): qty 0.5

## 2011-07-10 MED ORDER — HALOPERIDOL LACTATE 5 MG/ML IJ SOLN
INTRAMUSCULAR | Status: AC
  Filled 2011-07-10: qty 1
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
## 2011-07-10 NOTE — Progress Notes (Addendum)
Pt very restless/agitated.  Pt vomited, tube feeds held.  MD at bedside and aware of patients status-plans to extubate patient

## 2011-07-10 NOTE — Procedures (Signed)
Extubation Procedure Note  Patient Details:   Name: Tonya [name] DOB: 08-19-1985 MRN: [id number]   Airway Documentation:  AIRWAYS 7.5 mm (Active)  Secured at (cm) 21 cm 07/06/2011  4:00 PM  Measured From Lips 07/06/2011  4:00 PM  Secured Location Right 07/06/2011  4:00 PM  Secured By [employer] 07/06/2011  4:00 PM  Site Condition Dry 07/06/2011  4:00 PM     Airway 7.5 mm (Active)  Secured at (cm) 19 cm 07/10/2011  3:40 AM  Measured From Lips 07/10/2011  3:40 AM  Secured Location Right 07/10/2011  3:40 AM  Secured By [employer] 07/10/2011  3:40 AM  Tube Holder Repositioned Yes 07/10/2011  3:40 AM  Cuff Pressure (cm H2O) 20 cm H2O 07/09/2011  7:22 PM  Site Condition Dry 07/10/2011  3:40 AM    Evaluation  O2 sats: stable throughout Complications: No apparent complications Patient did tolerate procedure well. Bilateral Breath Sounds: Rhonchi;Diminished Suctioning: Airway Yes  Ok Anis, MA 07/10/2011, 8:37 AM

## 2011-07-10 NOTE — Progress Notes (Signed)
Follow up - Trauma and Critical Care  Patient Details:    Tonya [name] is an 26 y.o. female.  Lines/tubes : AIRWAYS 7.5 mm (Active)  Secured at (cm) 21 cm 07/06/2011  4:00 PM  Measured From Lips 07/06/2011  4:00 PM  Secured Location Right 07/06/2011  4:00 PM  Secured By [employer] 07/06/2011  4:00 PM  Site Condition Dry 07/06/2011  4:00 PM     Airway 7.5 mm (Active)  Secured at (cm) 19 cm 07/10/2011  3:40 AM  Measured From Lips 07/10/2011  3:40 AM  Secured Location Right 07/10/2011  3:40 AM  Secured By [employer] 07/10/2011  3:40 AM  Tube Holder Repositioned Yes 07/10/2011  3:40 AM  Cuff Pressure (cm H2O) 20 cm H2O 07/09/2011  7:22 PM  Site Condition Dry 07/10/2011  3:40 AM     PICC Triple Lumen 07/04/11 PICC Right Brachial (Active)  Length mark (cm) 5 cm 07/04/2011  5:25 PM  Site Assessment Clean;Dry;Intact 07/09/2011  9:00 PM  Lumen #1 Status Infusing 07/09/2011  9:00 PM  Lumen #2 Status Infusing 07/09/2011  9:00 PM  Lumen #3 Status Capped (Central line) 07/09/2011  9:00 PM  Dressing Type Transparent;Occlusive 07/09/2011  9:00 PM  Dressing Status Clean;Dry;Intact 07/09/2011  9:00 PM  Line Care Connections checked and tightened 07/09/2011  9:00 PM  Dressing Intervention Dressing changed;Antimicrobial disc changed 07/06/2011 10:00 PM  Dressing Change Due 07/12/11 07/09/2011  9:00 PM  Indication for Insertion or Continuance of Line Limited venous access - need for IV therapy >5 days (PICC only) 07/09/2011  9:00 PM     NG/OG Tube Orogastric (Active)  Placement Verification Auscultation 07/09/2011  8:00 PM  Site Assessment Clean;Dry;Intact 07/09/2011  8:00 PM  Status Infusing tube feed;Irrigated 07/09/2011  8:00 PM  Drainage Appearance Bile;Tan 07/07/2011  8:00 PM  Gastric Residual 30 mL 07/09/2011  8:00 AM  Intake (mL) 35 mL 07/10/2011  7:00 AM  Output (mL) 15 mL 07/06/2011  4:00 PM     Urethral Catheter (Active)  Site Assessment Clean;Intact 07/09/2011  8:00 PM  Collection Container Standard  drainage bag 07/09/2011  8:00 PM  Securement Method Leg strap 07/09/2011  8:00 PM  Urinary Catheter Interventions Unclamped 07/09/2011  8:00 PM  Indication for Insertion or Continuance of Catheter Urinary output monitoring 07/09/2011  8:00 PM  Output (mL) 300 mL 07/10/2011  6:00 AM    Microbiology/Sepsis markers: Results for orders placed during the hospital encounter of 06/27/11  MRSA PCR SCREENING     Status: Normal   Collection Time   06/27/11  4:45 AM      Component Value Range Status Comment   MRSA by PCR NEGATIVE  NEGATIVE  Final   CULTURE, RESPIRATORY     Status: Normal   Collection Time   06/30/11  8:18 AM      Component Value Range Status Comment   Specimen Description ENDOTRACHEAL ASPIRATE   Final    Special Requests NONE   Final    Gram Stain     Final    Value: RARE WBC PRESENT,BOTH PMN AND MONONUCLEAR     NO SQUAMOUS EPITHELIAL CELLS SEEN     NO ORGANISMS SEEN   Culture NO GROWTH 2 DAYS   Final    Report Status 07/02/2011 FINAL   Final   URINE CULTURE     Status: Normal   Collection Time   06/30/11  9:45 AM      Component Value Range Status Comment  Specimen Description URINE, CATHETERIZED   Final    Special Requests NONE   Final    Culture  Setup Time [id number]   Final    Colony Count NO GROWTH   Final    Culture NO GROWTH   Final    Report Status 07/01/2011 FINAL   Final   CULTURE, BLOOD (ROUTINE X 2)     Status: Normal   Collection Time   06/30/11 10:30 AM      Component Value Range Status Comment   Specimen Description BLOOD ARM LEFT   Final    Special Requests BOTTLES DRAWN AEROBIC ONLY 8.0CC ONLY    Final    Culture  Setup Time [id number]   Final    Culture NO GROWTH 5 DAYS   Final    Report Status 07/06/2011 FINAL   Final   CULTURE, BLOOD (ROUTINE X 2)     Status: Normal   Collection Time   07/03/11  9:10 AM      Component Value Range Status Comment   Specimen Description BLOOD LEFT ARM   Final    Special Requests BOTTLES DRAWN AEROBIC AND ANAEROBIC 10CC    Final    Culture  Setup Time [id number]   Final    Culture NO GROWTH 5 DAYS   Final    Report Status 07/09/2011 FINAL   Final   CULTURE, BLOOD (ROUTINE X 2)     Status: Normal   Collection Time   07/03/11  9:30 AM      Component Value Range Status Comment   Specimen Description BLOOD CENTRAL LINE   Final    Special Requests     Final    Value: BOTTLES DRAWN AEROBIC AND ANAEROBIC 10CC CENTRAL LINE IJ   Culture  Setup Time [id number]   Final    Culture NO GROWTH 5 DAYS   Final    Report Status 07/09/2011 FINAL   Final   CULTURE, BAL-QUANTITATIVE     Status: Normal   Collection Time   07/03/11 10:20 AM      Component Value Range Status Comment   Specimen Description BRONCHIAL ALVEOLAR LAVAGE   Final    Special Requests NONE   Final    Gram Stain     Final    Value: FEW WBC PRESENT,BOTH PMN AND MONONUCLEAR     NO ORGANISMS SEEN     NO ORGANISMS SEEN   Colony Count NO GROWTH   Final    Culture NO GROWTH 2 DAYS   Final    Report Status 07/05/2011 FINAL   Final     Anti-infectives:  Anti-infectives     Start     Dose/Rate Route Frequency Ordered Stop   07/06/11 0200   vancomycin (VANCOCIN) IVPB 1000 mg/200 mL premix        1,000 mg 200 mL/hr over 60 Minutes Intravenous Every 8 hours 07/05/11 1920     07/04/11 1000   vancomycin (VANCOCIN) 1,250 mg in sodium chloride 0.9 % 250 mL IVPB  Status:  Discontinued        1,250 mg 166.7 mL/hr over 90 Minutes Intravenous Every 8 hours 07/04/11 0807 07/05/11 1920   07/04/11 0900  piperacillin-tazobactam (ZOSYN) IVPB 3.375 g       3.375 g 12.5 mL/hr over 240 Minutes Intravenous 3 times per day 07/04/11 0748     06/27/11 1400   ceFAZolin (ANCEF) IVPB 1 g/50 mL premix  Status:  Discontinued  1 g 100 mL/hr over 30 Minutes Intravenous 3 times per day 06/27/11 1056 07/04/11 0748   06/27/11 1300   gentamicin (GARAMYCIN) 530 mg in dextrose 5 % 100 mL IVPB  Status:  Discontinued        530 mg 113.3 mL/hr over 60 Minutes Intravenous Every  24 hours 06/27/11 1214 07/04/11 0748          Best Practice/Protocols:  VTE Prophylaxis: Lovenox (prophylaxtic dose) GI Prophylaxis: Proton Pump Inhibitor Continous Sedation has been stopped for extubation this mornig.  Consults: Treatment Team:  Budd Palmer, MD Sharma Covert, MD    Events:  Subjective:    Overnight Issues: Agitated all night.  Vomited tube feeding that she had been tolerating this morning.  Extubated when I arrived because of severer agitation and about to cough ETT out  Objective:  Vital signs for last 24 hours: Temp:  [98.4 F (36.9 C)-99.8 F (37.7 C)] 99.7 F (37.6 C) (06/10 0719) Pulse Rate:  [96-129] 129  (06/10 0719) Resp:  [14-29] 25  (06/10 0719) BP: (93-130)/(45-81) 130/63 mmHg (06/10 0719) SpO2:  [92 %-99 %] 93 % (06/10 0719) FiO2 (%):  [39.8 %-40 %] 39.9 % (06/10 0800) Weight:  [117.9 kg (259 lb 14.8 oz)] 117.9 kg (259 lb 14.8 oz) (06/10 0500)  Hemodynamic parameters for last 24 hours: CVP:  [13 mmHg-17 mmHg] 17 mmHg  Intake/Output from previous day: 06/09 0701 - 06/10 0700 In: 3999 [I.V.:2064; OZ/HY:8657; IV Piggyback:750] Out: 2796 [Urine:2795; Stool:1]  Intake/Output this shift:    Vent settings for last 24 hours: Vent Mode:  [-] PRVC FiO2 (%):  [39.8 %-40 %] 39.9 % Set Rate:  [18 bmp] 18 bmp Vt Set:  [550 mL] 550 mL PEEP:  [4.8 cmH20-5 cmH20] 4.8 cmH20 Pressure Support:  [10 cmH20] 10 cmH20 Plateau Pressure:  [20 cmH20-23 cmH20] 20 cmH20  Physical Exam:  General: alert and Severe distress this morning, trying to self-extubate. Neuro: alert, nonfocal exam and agitated Resp: diminished breath sounds bibasilar and bilaterally and rhonchi RLL CVS: tachycardic GI: soft, nontender, BS WNL, no r/g and had been tolerating tube feedings well until this morning. Extremities: as befroe with right AKA and left external fixator.  Results for orders placed during the hospital encounter of 06/27/11 (from the past 24 hour(s))    GLUCOSE, CAPILLARY     Status: Abnormal   Collection Time   07/09/11 12:01 PM      Component Value Range   Glucose-Capillary 110 (*) 70 - 99 (mg/dL)   Comment 1 Notify RN    GLUCOSE, CAPILLARY     Status: Abnormal   Collection Time   07/09/11  3:42 PM      Component Value Range   Glucose-Capillary 108 (*) 70 - 99 (mg/dL)   Comment 1 Notify RN    GLUCOSE, CAPILLARY     Status: Abnormal   Collection Time   07/09/11  8:06 PM      Component Value Range   Glucose-Capillary 112 (*) 70 - 99 (mg/dL)  GLUCOSE, CAPILLARY     Status: Abnormal   Collection Time   07/10/11 12:07 AM      Component Value Range   Glucose-Capillary 115 (*) 70 - 99 (mg/dL)  GLUCOSE, CAPILLARY     Status: Abnormal   Collection Time   07/10/11  3:47 AM      Component Value Range   Glucose-Capillary 139 (*) 70 - 99 (mg/dL)  GLUCOSE, CAPILLARY     Status: Abnormal  Collection Time   07/10/11  7:16 AM      Component Value Range   Glucose-Capillary 111 (*) 70 - 99 (mg/dL)   Comment 1 Documented in Chart     Comment 2 Notify RN       Assessment/Plan:   NEURO  Altered Mental Status:  agitation and history of bipolar disease   Plan: CPM  PULM  Atelectasis/collapse (focal and bibasilar)   Plan: May possibly need biPAP now that she is extubated.  CARDIO  Sinus Tachycardia   Plan: No specific treatment  RENAL  No spedific abnormality   Plan: CPM  GI  OGT is out and will need to get swallowing evaluation if she remains extubated   Plan: Swallowing examination pending   ID  No specific infectious source.  Patient is on Zosyn and Vanco, may discontinue if all dultures remain negative.  BAL 6/5 nega   Plan: CPM  HEME  Anemia acute blood loss anemia, anemia of chronic disease and anemia of critical illness)   Plan: No blood.  Will ask pateint if she want blood now that she is extubated  ENDO No specific problem   Plan: CPM  Global Issues  The patient was extubated this morning because of severe agitation and it  was felt that paralyzation would have been necessary to control her on the ventilator.  Her secretions were moderated, and her oxygen saturations were good enough to attempt extubation.  She was extubated with me at the bedside, and she is maintaining saturations in the low 90% range with a RR 25-45.  She has a good cough.  Her HR remains in the 130's    LOS: 13 days   Additional comments:I reviewed the patient's new clinical lab test results. cbc to be done and I reviewed the patients new imaging test results. CXR  Critical Care Total Time*: 45 Minutes  Antrice Pal III,Felicidad Sugarman O 07/10/2011  *Care during the described time interval was provided by me and/or other providers on the critical care team.  I have reviewed this patient's available data, including medical history, events of note, physical examination and test results as part of my evaluation.

## 2011-07-10 NOTE — Progress Notes (Signed)
Nutrition Follow-up  Patient extubated 6/10 AM. EN discontinued via OGT with extubation. CWOCN note reviewed 6/10 -- partial thickness areas of road rash healing well and appear to be decreasing in size. Plan is for swallow evaluation for potential PO diet advancement; noted may need BiPAP support.  Diet Order:  NPO  Meds: Scheduled Meds:   . antiseptic oral rinse  15 mL Mouth Rinse QID  . bisacodyl  10 mg Rectal Daily  . chlorhexidine  15 mL Mouth Rinse BID  . clonazePAM  1 mg Per Tube Q12H  . darbepoetin (ARANESP) injection - NON-DIALYSIS  100 mcg Subcutaneous Q Wed-1800  . docusate  50 mg Oral BID  . enoxaparin (LOVENOX) injection  30 mg Subcutaneous Q12H  . feeding supplement (PIVOT 1.5 CAL)  1,000 mL Per Tube Q24H  . feeding supplement  30 mL Oral BID WC  . ferrous sulfate  300 mg Oral TID WC  . haloperidol lactate      . insulin aspart  0-20 Units Subcutaneous Q4H  . metoprolol tartrate  12.5 mg Oral BID  . multivitamin  5 mL Per Tube Daily  . neomycin-bacitracin-polymyxin   Topical Daily  . pantoprazole  40 mg Oral Q1200   Or  . pantoprazole (PROTONIX) IV  40 mg Intravenous Q1200  . QUEtiapine  100 mg Per Tube Q12H  . sodium chloride  10-40 mL Intracatheter Q12H  . DISCONTD: piperacillin-tazobactam (ZOSYN)  IV  3.375 g Intravenous Q8H  . DISCONTD: vancomycin  1,000 mg Intravenous Q8H   Continuous Infusions:   . dexmedetomidine (PRECEDEX) IV infusion for high rates Stopped (07/04/11 2225)  . dextrose 5 % and 0.45 % NaCl with KCl 20 mEq/L 100 mL/hr at 07/10/11 1500  . DISCONTD: fentaNYL infusion INTRAVENOUS 300 mcg/hr (07/10/11 0700)  . DISCONTD: midazolam (VERSED) infusion 6 mg/hr (07/10/11 0753)   PRN Meds:.acetaminophen, acetaminophen, dextrose, fentaNYL, haloperidol lactate, midazolam, ondansetron (ZOFRAN) IV, ondansetron, sodium chloride  Labs:  CMP     Component Value Date/Time   NA 136 07/08/2011 0410   K 3.5 07/08/2011 0410   CL 102 07/08/2011 0410   CO2 25  07/08/2011 0410   GLUCOSE 125* 07/08/2011 0410   BUN 18 07/08/2011 0410   CREATININE 0.69 07/08/2011 0410   CALCIUM 7.8* 07/08/2011 0410   PROT 4.1* 06/27/2011 0435   ALBUMIN 2.0* 06/27/2011 0435   AST 98* 06/27/2011 0435   ALT 44* 06/27/2011 0435   ALKPHOS 35* 06/27/2011 0435   BILITOT 0.4 06/27/2011 0435   GFRNONAA >90 07/08/2011 0410   GFRAA >90 07/08/2011 0410     Intake/Output Summary (Last 24 hours) at 07/10/11 1601 Last data filed at 07/10/11 1500  Gross per 24 hour  Intake 3379.34 ml  Output   3046 ml  Net 333.34 ml    CBG (last 3)   Basename 07/10/11 1544 07/10/11 1158 07/10/11 0716  GLUCAP 127* 143* 111*    Weight Status:  117.9 kg (6/10) -- fluctuating  Re-estimated needs:  1800-2000 kcals, 100-110 gm protein  Nutrition Dx:  Inadequate Oral Intake now r/t inability to eat, s/p extubation as evidenced by NPO status, ongoing  New Goal:  Oral diet vs EN re-initiation to meet >90% of estimated nutrition needs, currently unmet Monitor: PO diet advancement, respiratory status, weight, labs, I/o's  Intervention:   Await swallow evaluation/SLP recommendations   RD to follow for nutrition care plan, make recommendations/add interventions accordingly  Alger Memos Pager #:  [phone]
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
## 2011-07-10 NOTE — Clinical Social Work Note (Signed)
Clinical Social Worker spoke with patient mother over the phone to offer continued emotional support.  Per MD, patient was extubated this morning - patient mother aware.  Patient mother states that patient was writing and texting over the weekend stating she was hungry.  Patient mother found comfort in knowing that patient is cognitively appropriate at this time.  CSW notified Psych CSW that patient was extubated but experiencing significant anxiety at this time.  CSW will continue to follow and psych services will interact with patient once appropriate.    Clinical Social Worker will remain available for support and to facilitate patient discharge when medically stable.  [address] [place], [place] [phone]
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
## 2011-07-11 ENCOUNTER — Inpatient Hospital Stay (HOSPITAL_COMMUNITY): Payer: Medicare Other

## 2011-07-11 LAB — CBC
HCT: 23.3 % — ABNORMAL LOW (ref 36.0–46.0)
Hemoglobin: 7.2 g/dL — ABNORMAL LOW (ref 12.0–15.0)
MCHC: 30.9 g/dL (ref 30.0–36.0)
RBC: 2.47 MIL/uL — ABNORMAL LOW (ref 3.87–5.11)

## 2011-07-11 LAB — GLUCOSE, CAPILLARY
Glucose-Capillary: 113 mg/dL — ABNORMAL HIGH (ref 70–99)
Glucose-Capillary: 121 mg/dL — ABNORMAL HIGH (ref 70–99)

## 2011-07-11 MED ORDER — HYDROCODONE-ACETAMINOPHEN 7.5-500 MG/15ML PO SOLN
10.0000 mL | ORAL | Status: DC | PRN
  Administered 2011-07-14 – 2011-07-15 (×2): 15 mL
  Filled 2011-07-11 (×2): qty 15

## 2011-07-11 MED ORDER — ACETAMINOPHEN 160 MG/5ML PO SUSP
650.0000 mg | ORAL | Status: DC | PRN
  Administered 2011-07-16: 650 mg
  Filled 2011-07-11: qty 20.3

## 2011-07-11 MED ORDER — PANTOPRAZOLE SODIUM 40 MG PO PACK
40.0000 mg | PACK | Freq: Every day | ORAL | Status: DC
  Administered 2011-07-11 – 2011-07-16 (×5): 40 mg
  Filled 2011-07-11 (×8): qty 20

## 2011-07-11 MED ORDER — MORPHINE SULFATE 4 MG/ML IJ SOLN
4.0000 mg | INTRAMUSCULAR | Status: DC | PRN
  Administered 2011-07-11 – 2011-07-15 (×8): 4 mg via INTRAVENOUS
  Filled 2011-07-11 (×8): qty 1
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
## 2011-07-11 NOTE — Significant Event (Signed)
1700pm-TF started via panda per verbal order from MD Tinnie Gens after KUB. PIVOT 1.5 started at 20cc/hour. Will monitor. Trent Gabler, Charity fundraiser.

## 2011-07-11 NOTE — Progress Notes (Signed)
12 Days Post-Op  Subjective: Extubated.  No complaints other than being "hungry".  +BM Denies any pain.  Objective: Vital signs in last 24 hours: Temp:  [98.6 F (37 C)-99.8 F (37.7 C)] 99.3 F (37.4 C) (06/11 0729) Pulse Rate:  [99-132] 108  (06/11 0700) Resp:  [21-44] 25  (06/11 0700) BP: (86-130)/(41-109) 99/61 mmHg (06/11 0700) SpO2:  [90 %-98 %] 98 % (06/11 0700) FiO2 (%):  [39.9 %] 39.9 % (06/10 0800) Weight:  [255 lb 4.7 oz (115.8 kg)] 255 lb 4.7 oz (115.8 kg) (06/11 0400) Last BM Date: 07/10/11  Intake/Output from previous day: 06/10 0701 - 06/11 0700 In: 2466.8 [I.V.:2194.3; NG/GT:35; IV Piggyback:237.5] Out: 3250 [Urine:3250] Intake/Output this shift:    General appearance: alert, cooperative and no distress Head: Normocephalic, without obvious abnormality, atraumatic Eyes: negative Neck: no adenopathy, no JVD, supple, symmetrical, trachea midline and full ROM, no neck pain, no boney tenderness, no soft tissue tenderness or pain with motion, full ROM, collar removed Resp: normal effort, slight tachypnea but appears to be breathing comfortably, some wheeze but otherwise clear Cardio: tachycardic 102-112, regular GI: soft, non-tender; bowel sounds normal; no masses,  no organomegaly Extremities: dressings in place Skin: severa abrasions on forehead and bilateral arms Neurologic: Grossly normal  Lab Results:   Basename 07/11/11 0400 07/10/11 1016  WBC 14.5* 17.1*  HGB 7.2* 7.1*  HCT 23.3* 23.2*  PLT 438* 444*   BMET No results found for this basename: NA:2,K:2,CL:2,CO2:2,GLUCOSE:2,BUN:2,CREATININE:2,CALCIUM:2 in the last 72 hours PT/INR No results found for this basename: LABPROT:2,INR:2 in the last 72 hours ABG No results found for this basename: PHART:2,PCO2:2,PO2:2,HCO3:2 in the last 72 hours  Studies/Results: Dg Chest Port 1 View  07/10/2011  *RADIOLOGY REPORT*  Clinical Data: Evaluate atelectasis.  PORTABLE CHEST - 1 VIEW  Comparison: Chest x-ray  07/07/2011.  Findings: An endotracheal tube is in place with tip 4.3 cm above the carina. A nasogastric tube is seen extending into the stomach, however, the tip of the nasogastric tube extends below the lower margin of the image.  Lung volumes are low.  There are patchy interstitial and airspace opacities throughout the entire left lung, and at the right base.  Minimal blunting of the left costophrenic sulcus may suggest the presence of a small left-sided pleural effusion.  No definite right-sided pleural effusion.  Heart size is within normal limits. The patient is rotated to the left on today's exam, resulting in distortion of the mediastinal contours and reduced diagnostic sensitivity and specificity for mediastinal pathology.  IMPRESSION: 1.  Support apparatus, as above. 2.  Low lung volumes with worsening aeration in the lungs.  The developing multi focal interstitial and airspace opacities are most concerning for developing multilobar pneumonia, but may be sequela of aspiration, or developing multilobar alveolar hemorrhage. Clinical correlation is recommended.  Original Report Authenticated By: Florencia Reasons, M.D.   Dg Tibia/fibula Left Port  07/10/2011  *RADIOLOGY REPORT*  Clinical Data: Tibia fracture.  PORTABLE LEFT TIBIA AND FIBULA - 2 VIEW  Comparison: Radiographs dated 06/27/2011  Findings: Compared to the prior exam angulation and displacement of the major fragments has slightly improved in the AP projection.  In the lateral projection there is also improvement in the displacement and no change in the slight angulation at the proximal and distal aspects of the fracture.  IMPRESSION: Slightly improved angulation and displacement of the fractures as described.  Original Report Authenticated By: Gwynn Burly, M.D.    Anti-infectives: Anti-infectives     Start  Dose/Rate Route Frequency Ordered Stop   07/06/11 0200   vancomycin (VANCOCIN) IVPB 1000 mg/200 mL premix  Status:   Discontinued        1,000 mg 200 mL/hr over 60 Minutes Intravenous Every 8 hours 07/05/11 1920 07/10/11 1144   07/04/11 1000   vancomycin (VANCOCIN) 1,250 mg in sodium chloride 0.9 % 250 mL IVPB  Status:  Discontinued        1,250 mg 166.7 mL/hr over 90 Minutes Intravenous Every 8 hours 07/04/11 0807 07/05/11 1920   07/04/11 0900   piperacillin-tazobactam (ZOSYN) IVPB 3.375 g  Status:  Discontinued        3.375 g 12.5 mL/hr over 240 Minutes Intravenous 3 times per day 07/04/11 0748 07/10/11 1144   06/27/11 1400   ceFAZolin (ANCEF) IVPB 1 g/50 mL premix  Status:  Discontinued        1 g 100 mL/hr over 30 Minutes Intravenous 3 times per day 06/27/11 1056 07/04/11 0748   06/27/11 1300   gentamicin (GARAMYCIN) 530 mg in dextrose 5 % 100 mL IVPB  Status:  Discontinued        530 mg 113.3 mL/hr over 60 Minutes Intravenous Every 24 hours 06/27/11 1214 07/04/11 0748          Assessment/Plan: s/p Procedure(s) (LRB): AMPUTATION ABOVE KNEE (Right) IRRIGATION AND DEBRIDEMENT EXTREMITY (Right) she seems to be improving.  Mental status appropriate but with sitter.  will get inpatient psychiatry consult.  Pulmonary toilet.  breathing better.  Still mildly tachycardic but improved.  HGB 7.2 which was stable.  I discussed with her the low hgb and her desires to avoid transfusion.  She expressed to me her desires to avoid blood transfusion even in life threatening circumstances due to religious reasons.  Avoid lab draws.  would like to advance diet but she seems to be choking with liquids.  will check swallow evalauation and if okay, diet as tolerated.  PT/OT as well.  LOS: 14 days    Tonya [name] 07/11/2011
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
## 2011-07-11 NOTE — Significant Event (Signed)
0800am-attempted bedside swallowing by verbal order of MD Biagio Quint. MD Biagio Quint present at bedside. Pt coughing after water intake. Hold PO intake; hold PO medications until evaluated with Speech. Will monitor. Keyunna Coco, Charity fundraiser.

## 2011-07-11 NOTE — Significant Event (Signed)
Pt failed FEES this morning, with recommendations from Speech for an alternative route for medications and nutrition. Spoken with MD Tinnie Gens and requested placement of a panda. MD agreed; gave verbal order to put in panda and obtain KUB. Will proceed with orders. Tonya [name], Charity fundraiser.

## 2011-07-11 NOTE — Progress Notes (Signed)
Nutrition Follow-up  Patient s/p bedside swallow evaluation, FEES today. Patient exhibited moderate-severe pharyngeal dysphagia. SLP recommending continued NPO status at this time. Panda tube in place -- tip in region of distal antrum of stomach. RD to adjust EN regimen -- RN aware.  Diet Order:  NPO  Meds: Scheduled Meds:   . albuterol  2.5 mg Nebulization Q8H  . antiseptic oral rinse  15 mL Mouth Rinse QID  . bisacodyl  10 mg Rectal Daily  . chlorhexidine  15 mL Mouth Rinse BID  . clonazePAM  1 mg Per Tube Q12H  . darbepoetin (ARANESP) injection - NON-DIALYSIS  100 mcg Subcutaneous Q Wed-1800  . docusate  50 mg Oral BID  . enoxaparin (LOVENOX) injection  30 mg Subcutaneous Q12H  . feeding supplement (PIVOT 1.5 CAL)  1,000 mL Per Tube Q24H  . feeding supplement  30 mL Oral BID WC  . ferrous sulfate  300 mg Oral TID WC  . insulin aspart  0-20 Units Subcutaneous Q4H  . metoprolol tartrate  12.5 mg Oral BID  . multivitamin  5 mL Per Tube Daily  . neomycin-bacitracin-polymyxin   Topical Daily  . pantoprazole sodium  40 mg Per Tube Q1200  . QUEtiapine  100 mg Per Tube Q12H  . sodium chloride  10-40 mL Intracatheter Q12H  . DISCONTD: pantoprazole  40 mg Oral Q1200  . DISCONTD: pantoprazole (PROTONIX) IV  40 mg Intravenous Q1200   Continuous Infusions:   . dextrose 5 % and 0.45 % NaCl with KCl 20 mEq/L 100 mL/hr at 07/11/11 0807  . DISCONTD: dexmedetomidine (PRECEDEX) IV infusion for high rates Stopped (07/04/11 2225)   PRN Meds:.acetaminophen, dextrose, haloperidol lactate, HYDROcodone-acetaminophen, morphine injection, ondansetron (ZOFRAN) IV, ondansetron, sodium chloride, DISCONTD: acetaminophen, DISCONTD: acetaminophen, DISCONTD: fentaNYL, DISCONTD: midazolam  Labs:  CMP     Component Value Date/Time   NA 136 07/08/2011 0410   K 3.5 07/08/2011 0410   CL 102 07/08/2011 0410   CO2 25 07/08/2011 0410   GLUCOSE 125* 07/08/2011 0410   BUN 18 07/08/2011 0410   CREATININE 0.69 07/08/2011 0410     CALCIUM 7.8* 07/08/2011 0410   PROT 4.1* 06/27/2011 0435   ALBUMIN 2.0* 06/27/2011 0435   AST 98* 06/27/2011 0435   ALT 44* 06/27/2011 0435   ALKPHOS 35* 06/27/2011 0435   BILITOT 0.4 06/27/2011 0435   GFRNONAA >90 07/08/2011 0410   GFRAA >90 07/08/2011 0410     Intake/Output Summary (Last 24 hours) at 07/11/11 1613 Last data filed at 07/11/11 1500  Gross per 24 hour  Intake   1900 ml  Output   5637 ml  Net  -3737 ml    CBG (last 3)   Basename 07/11/11 1140 07/11/11 0726 07/11/11 0413  GLUCAP 129* 121* 113*    Weight Status:  115.8 kg (6/11) -- fluctuating  Re-estimated needs:  1800-2000 kcals, 100-115 gm protein  Nutrition Dx:  Inadequate Oral Intake now r/t inability to eat, moderate-severe dysphagia as evidenced by NPO status, ongoing  Goal:  EN to meet >90% of estimated nutrition needs, progressing Monitor: EN regimen & tolerance, weight, labs, I/O's  Intervention:    Re-initiate Pivot 1.5 formula at 20 ml/hr and increase by 10 ml every 4 hours to goal rate of 50 ml/hr to provide 1800 kcals, 113 gm protein, 911 ml of free water  RD to follow for nutrition care plan  Alger Memos Pager #:  [phone]

## 2011-07-11 NOTE — Significant Event (Signed)
Attempted placement of panda when patient requested for pain medication prior to insertion of panda. Pt did not have PRN pain medication. Spoken with MD Tinnie Gens and received new orders. Will monitor. April Carlyon, Charity fundraiser.

## 2011-07-12 DIAGNOSIS — F329 Major depressive disorder, single episode, unspecified: Secondary | ICD-10-CM

## 2011-07-12 DIAGNOSIS — R45851 Suicidal ideations: Secondary | ICD-10-CM

## 2011-07-12 LAB — GLUCOSE, CAPILLARY
Glucose-Capillary: 143 mg/dL — ABNORMAL HIGH (ref 70–99)
Glucose-Capillary: 108 mg/dL — ABNORMAL HIGH (ref 70–99)

## 2011-07-12 MED ORDER — ALBUTEROL SULFATE (5 MG/ML) 0.5% IN NEBU
2.5000 mg | INHALATION_SOLUTION | Freq: Two times a day (BID) | RESPIRATORY_TRACT | Status: DC
  Filled 2011-07-12: qty 0.5

## 2011-07-12 MED ORDER — CITALOPRAM HYDROBROMIDE 10 MG/5ML PO SOLN
30.0000 mg | Freq: Every day | ORAL | Status: DC
Start: 2011-07-12 — End: 2011-07-17
  Administered 2011-07-12 – 2011-07-16 (×5): 30 mg
  Filled 2011-07-12 (×6): qty 20

## 2011-07-12 MED ORDER — SODIUM CHLORIDE 0.9 % IV SOLN: 1000.0000 mg | Freq: Once | INTRAVENOUS | Status: DC

## 2011-07-12 MED ORDER — IRON DEXTRAN 50 MG/ML IJ SOLN
25.0000 mg | Freq: Once | INTRAMUSCULAR | Status: AC
  Administered 2011-07-12: 25 mg via INTRAVENOUS
  Filled 2011-07-12: qty 0.5

## 2011-07-12 MED ORDER — SODIUM CHLORIDE 0.9 % IV SOLN
1000.0000 mg | Freq: Once | INTRAVENOUS | Status: AC
  Administered 2011-07-12: 1000 mg via INTRAVENOUS
  Filled 2011-07-12: qty 20

## 2011-07-12 MED ORDER — PHENOL 1.4 % MT LIQD
1.0000 | OROMUCOSAL | Status: DC | PRN
  Filled 2011-07-12: qty 177
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
## 2011-07-12 NOTE — Progress Notes (Signed)
Pt's mother [name] PHone [phone] notified of pt's transfer to [phone]

## 2011-07-12 NOTE — Progress Notes (Signed)
Pt discussed in hospital LOS meeting this am.  
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
## 2011-07-12 NOTE — Progress Notes (Signed)
PT Cancellation Note  Treatment cancelled today due to medical issues with patient which prohibited therapy.  Tonya [name],Tonya [name] 07/12/2011, 12:22 PM [facility] [phone] (pager)

## 2011-07-12 NOTE — Progress Notes (Signed)
Trauma Service Note  Subjective: The patient is very calm and conversant this morning.  Having no breating difficulty  Objective: Vital signs in last 24 hours: Temp:  [98.7 F (37.1 C)-99.6 F (37.6 C)] 98.7 F (37.1 C) (06/12 0740) Pulse Rate:  [73-117] 111  (06/12 0700) Resp:  [19-38] 25  (06/12 0700) BP: (94-120)/(52-91) 107/58 mmHg (06/12 0700) SpO2:  [93 %-100 %] 96 % (06/12 0700) Weight:  [111 kg (244 lb 11.4 oz)] 111 kg (244 lb 11.4 oz) (06/12 0600) Last BM Date: 07/10/11  Intake/Output from previous day: 06/11 0701 - 06/12 0700 In: 3070 [I.V.:2400; NG/GT:660; IV Piggyback:10] Out: 2963 [Urine:2920; Emesis/NG output:40; Stool:3] Intake/Output this shift:    General: No acute distress  Lungs: Clear, oxygen saturation 97% on RA currently  Abd: Soft, nildly distended.  Failed swallowing examination yesterday.  Extremities: R. AKA, left LE with external fixation.  Neuro: Intact  Lab Results: CBC   Basename 07/11/11 0400 07/10/11 1016  WBC 14.5* 17.1*  HGB 7.2* 7.1*  HCT 23.3* 23.2*  PLT 438* 444*   BMET No results found for this basename: NA:2,K:2,CL:2,CO2:2,GLUCOSE:2,BUN:2,CREATININE:2,CALCIUM:2 in the last 72 hours PT/INR No results found for this basename: LABPROT:2,INR:2 in the last 72 hours ABG No results found for this basename: PHART:2,PCO2:2,PO2:2,HCO3:2 in the last 72 hours  Studies/Results: Dg Tibia/fibula Left Port  07/10/2011  *RADIOLOGY REPORT*  Clinical Data: Tibia fracture.  PORTABLE LEFT TIBIA AND FIBULA - 2 VIEW  Comparison: Radiographs dated 06/27/2011  Findings: Compared to the prior exam angulation and displacement of the major fragments has slightly improved in the AP projection.  In the lateral projection there is also improvement in the displacement and no change in the slight angulation at the proximal and distal aspects of the fracture.  IMPRESSION: Slightly improved angulation and displacement of the fractures as described.  Original  Report Authenticated By: Gwynn Burly, M.D.   Dg Abd Portable 1v  07/11/2011  *RADIOLOGY REPORT*  Clinical Data: Feeding tube placement  PORTABLE ABDOMEN - 1 VIEW  Comparison: None.  Findings: The tip of the feeding tube is in the region of the distal antrum of the stomach.  The bowel gas pattern is nonspecific.  IMPRESSION: Tip of feeding tube in the region of the distal antrum of stomach directed toward the duodenum.  Original Report Authenticated By: Juline Patch, M.D.    Anti-infectives: Anti-infectives     Start     Dose/Rate Route Frequency Ordered Stop   07/06/11 0200   vancomycin (VANCOCIN) IVPB 1000 mg/200 mL premix  Status:  Discontinued        1,000 mg 200 mL/hr over 60 Minutes Intravenous Every 8 hours 07/05/11 1920 07/10/11 1144   07/04/11 1000   vancomycin (VANCOCIN) 1,250 mg in sodium chloride 0.9 % 250 mL IVPB  Status:  Discontinued        1,250 mg 166.7 mL/hr over 90 Minutes Intravenous Every 8 hours 07/04/11 0807 07/05/11 1920   07/04/11 0900   piperacillin-tazobactam (ZOSYN) IVPB 3.375 g  Status:  Discontinued        3.375 g 12.5 mL/hr over 240 Minutes Intravenous 3 times per day 07/04/11 0748 07/10/11 1144   06/27/11 1400   ceFAZolin (ANCEF) IVPB 1 g/50 mL premix  Status:  Discontinued        1 g 100 mL/hr over 30 Minutes Intravenous 3 times per day 06/27/11 1056 07/04/11 0748   06/27/11 1300   gentamicin (GARAMYCIN) 530 mg in dextrose 5 % 100 mL  IVPB  Status:  Discontinued        530 mg 113.3 mL/hr over 60 Minutes Intravenous Every 24 hours 06/27/11 1214 07/04/11 0748          Assessment/Plan: s/p Procedure(s): AMPUTATION ABOVE KNEE IRRIGATION AND DEBRIDEMENT EXTREMITY Will transfer to SDU Continue tube feedings Continue PT/OT/ST Patient deciding about whether or not she will received blood. Decrease IVF to 50cc/hr Hemoglobin is better, but not improved enough to operate on her leg.  LOS: 15 days   Marta Lamas. Gae Bon, MD,  FACS [phone] Trauma Surgeon 07/12/2011
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
## 2011-07-12 NOTE — Progress Notes (Signed)
OT Cancellation Note  Treatment cancelled today due to medical issues with patient which prohibited therapy.  Jeani Hawking M [phone] 07/12/2011, 3:04 PM

## 2011-07-13 ENCOUNTER — Encounter (HOSPITAL_COMMUNITY): Payer: Self-pay | Admitting: General Surgery

## 2011-07-13 DIAGNOSIS — T1491XA Suicide attempt, initial encounter: Secondary | ICD-10-CM | POA: Diagnosis present

## 2011-07-13 LAB — CBC
Hemoglobin: 8.5 g/dL — ABNORMAL LOW (ref 12.0–15.0)
MCH: 29.5 pg (ref 26.0–34.0)
MCV: 92.1 fL (ref 78.0–100.0)
Platelets: 400 10*3/uL (ref 150–400)
Platelets: 472 10*3/uL — ABNORMAL HIGH (ref 150–400)
RBC: 2.89 MIL/uL — ABNORMAL LOW (ref 3.87–5.11)
RBC: 3.56 MIL/uL — ABNORMAL LOW (ref 3.87–5.11)
RDW: 16.7 % — ABNORMAL HIGH (ref 11.5–15.5)

## 2011-07-13 LAB — PREPARE RBC (CROSSMATCH)

## 2011-07-13 LAB — GLUCOSE, CAPILLARY
Glucose-Capillary: 104 mg/dL — ABNORMAL HIGH (ref 70–99)
Glucose-Capillary: 105 mg/dL — ABNORMAL HIGH (ref 70–99)
Glucose-Capillary: 126 mg/dL — ABNORMAL HIGH (ref 70–99)

## 2011-07-13 MED ORDER — ALTEPLASE 2 MG IJ SOLR
2.0000 mg | Freq: Once | INTRAMUSCULAR | Status: DC
  Filled 2011-07-13: qty 2

## 2011-07-13 MED ORDER — ACETAMINOPHEN 325 MG PO TABS
650.0000 mg | ORAL_TABLET | Freq: Once | ORAL | Status: AC
  Administered 2011-07-13: 650 mg via ORAL
  Filled 2011-07-13: qty 2

## 2011-07-13 MED ORDER — SODIUM CHLORIDE 0.9 % IJ SOLN
INTRAMUSCULAR | Status: AC
  Administered 2011-07-13: 40 mL
  Filled 2011-07-13: qty 20

## 2011-07-13 MED ORDER — DIPHENHYDRAMINE HCL 50 MG/ML IJ SOLN
25.0000 mg | Freq: Once | INTRAMUSCULAR | Status: AC
  Administered 2011-07-13: 25 mg via INTRAVENOUS
  Filled 2011-07-13: qty 1

## 2011-07-13 MED ORDER — QUETIAPINE FUMARATE 50 MG PO TABS
50.0000 mg | ORAL_TABLET | Freq: Two times a day (BID) | ORAL | Status: DC
  Administered 2011-07-13 (×2): 50 mg
  Filled 2011-07-13 (×5): qty 1

## 2011-07-13 MED ORDER — LACTATED RINGERS IV SOLN
INTRAVENOUS | Status: DC
  Administered 2011-07-13: 15:00:00 via INTRAVENOUS

## 2011-07-13 MED ORDER — FUROSEMIDE 10 MG/ML IJ SOLN
20.0000 mg | Freq: Once | INTRAMUSCULAR | Status: AC
  Administered 2011-07-13: 20 mg via INTRAVENOUS

## 2011-07-13 MED ORDER — ALTEPLASE 2 MG IJ SOLR
2.0000 mg | Freq: Once | INTRAMUSCULAR | Status: AC
  Administered 2011-07-13: 2 mg
  Filled 2011-07-13 (×2): qty 2

## 2011-07-13 MED ORDER — POLYETHYLENE GLYCOL 3350 17 G PO PACK
17.0000 g | PACK | Freq: Every day | ORAL | Status: DC
  Administered 2011-07-18: 17 g
  Filled 2011-07-13 (×7): qty 1

## 2011-07-13 MED ORDER — ALTEPLASE 2 MG IJ SOLR
2.0000 mg | Freq: Once | INTRAMUSCULAR | Status: AC
  Administered 2011-07-13: 2 mg
  Filled 2011-07-13: qty 2

## 2011-07-13 MED ORDER — FENTANYL CITRATE 0.05 MG/ML IJ SOLN: 50.0000 ug | INTRAMUSCULAR | Status: DC | PRN

## 2011-07-13 MED ORDER — CLONAZEPAM 0.5 MG PO TABS
0.5000 mg | ORAL_TABLET | Freq: Two times a day (BID) | ORAL | Status: DC
  Administered 2011-07-13 (×2): 0.5 mg
  Filled 2011-07-13 (×2): qty 1

## 2011-07-13 MED ORDER — CEFAZOLIN SODIUM 1-5 GM-% IV SOLN: 1.0000 g | Freq: Once | INTRAVENOUS | Status: DC

## 2011-07-13 MED ORDER — MIDAZOLAM HCL 2 MG/2ML IJ SOLN: 1.0000 mg | INTRAMUSCULAR | Status: DC | PRN

## 2011-07-13 MED ORDER — DOCUSATE SODIUM 50 MG/5ML PO LIQD
100.0000 mg | Freq: Two times a day (BID) | ORAL | Status: DC
  Administered 2011-07-13 – 2011-07-16 (×7): 100 mg via ORAL
  Filled 2011-07-13 (×11): qty 10

## 2011-07-13 MED ORDER — FUROSEMIDE 10 MG/ML IJ SOLN
20.0000 mg | Freq: Once | INTRAMUSCULAR | Status: AC
  Administered 2011-07-13: 20 mg via INTRAVENOUS
  Filled 2011-07-13: qty 2

## 2011-07-13 MED ORDER — ALBUTEROL SULFATE (5 MG/ML) 0.5% IN NEBU: 2.5000 mg | INHALATION_SOLUTION | Freq: Four times a day (QID) | RESPIRATORY_TRACT | Status: DC | PRN

## 2011-07-13 NOTE — Progress Notes (Signed)
Pt is preparing for surgery in am.  She has weighed the decision to have a blood transfusion and now has decided to agree to blood transfusion.  In addition, she wants to sign an advanced directive that designates her fiance will make decisions., e.g. Blood transfusions, on her behalf.  She demonstrates a conscious cognitive effort to weigh the pros and cons of transfusion in the context of needing to stabilize the fracture in her Left leg - scheduled for tomorrow.  Discussed with Shanda Bumps LCSW RECOMMENDATION:  1. Pt has capacity to make decisions regarding the welfare and improved outcome of pending surgery by agreeing to receive blood transfusion when it becomes necessary.  2. Will follow pt.  07/13/2011 6:42 PM

## 2011-07-13 NOTE — Progress Notes (Signed)
PT Cancellation Note  Treatment cancelled today due to medical issues with patient which prohibited therapy.  Fredrich Birks 07/13/2011, 9:05 AM 07/13/2011 Fredrich Birks PTA [phone] pager [phone] office

## 2011-07-13 NOTE — Progress Notes (Signed)
Patient ID: Tonya [name], female   DOB: 1985-02-11, 26 y.o.   MRN: [id number]   LOS: 16 days   Subjective: No significant c/o. Says voice is a little stronger.  Objective: Vital signs in last 24 hours: Temp:  [98.2 F (36.8 C)-99.4 F (37.4 C)] 98.2 F (36.8 C) (06/13 0403) Pulse Rate:  [94-114] 97  (06/13 0400) Resp:  [23-44] 26  (06/13 0400) BP: (94-122)/(52-79) 121/75 mmHg (06/13 0400) SpO2:  [85 %-97 %] 93 % (06/13 0400) Weight:  [110.2 kg (242 lb 15.2 oz)-112 kg (246 lb 14.6 oz)] 112 kg (246 lb 14.6 oz) (06/13 0403) Last BM Date: 07/12/11  Lab Results:  CBC -- Pending   General appearance: alert and no distress, OX3, intact memory Resp: Coarse bilaterally, left>right Cardio: Mild tachycardia GI: normal findings: bowel sounds normal and soft, non-tender Extremities: NVI   Assessment/Plan: PHBC   R traumatic trans-knee AKA - Closed yesterday  L tib fib Fx s/p ex fix -- OR tomorrow by Dr. Carola Frost Left 2nd MC fx -- Splint, WBAT ?L adrenal hemorrhage  Multiple abrasions - Xeroform and local care  R forearm 5th digit extensor tendon injury s/p repair -- WBAT Traumatic hernia R lateral abdominal wall - elective repair  Suicidal/bipolar - Needs IP psych treatment at some point according to psych. Competent to make decisions for herself. ABL anemia - Patient has agreed to transfusion this morning in order to facilitate ORIF LLE. FEN -- Tol TF. Leave foley until after OR. Check CMET in AM to check liver function, lyte status. VTE - Lovenox  Dispo -- OR tomorrow. Transfer to floor today.    Freeman Caldron, PA-C Pager: [phone] General Trauma PA Pager: [phone]   07/13/2011

## 2011-07-13 NOTE — Progress Notes (Signed)
Spoke with pt via phone re: Advance Directive paperwork.  Per pt, her fiance has the paperwork and is not at hospital at this time.  Spoke with pt's RN and instructed her to call [facility] this pm, as CSW was leaving, who could assist with notary services.

## 2011-07-13 NOTE — Progress Notes (Signed)
OT Cancellation Note  Treatment cancelled today due to medical issues with patient which prohibited therapy.  Dannya Pitkin A OTR/L [phone] 07/13/2011, 9:15 AM

## 2011-07-13 NOTE — Progress Notes (Signed)
No complaints. Receiving blood in preparation for surgery tomorrow.  Tonya [name]. Tonya Skains, MD, [facility]  07/13/2011 10:51 AM
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
## 2011-07-13 NOTE — Progress Notes (Signed)
Called report to RN on [phone]. Pt transferred to [phone] via bed. Lorette Ang

## 2011-07-13 NOTE — Clinical Social Work Note (Signed)
Clinical Social Worker spoke with patient at bedside to offer continued support.  Patient seems to be in good spirits under circumstances.  Patient has good insight regarding her ideas for continued rehab/therapy both physically and emotionally.  Patient seems logical about her plan for receiving blood and having surgery on her left leg tomorrow.    Patient requested to complete full advanced directives (Living Will and Healthcare Power of Attorney) prior to surgery tomorrow.  CSW contacted psychiatrist who plans to meet with patient this evening and document patient capacity to make decisions.  Per conversation with psychiatrist, patient will likely be of sound mind to sign the requested documents, however must have documentation in system prior to completing the notarization of the documents.  Patient fiance will be here later tonight with the documents - CSW will contact evening CSW to request follow up with patient and notarizing documents this evening due to planned early OR tomorrow.  Patient appreciative of CSW support and concern and requested follow up support.  [address] [place], [place] [phone]

## 2011-07-14 ENCOUNTER — Inpatient Hospital Stay (HOSPITAL_COMMUNITY): Payer: Medicare Other

## 2011-07-14 ENCOUNTER — Encounter (HOSPITAL_COMMUNITY): Payer: Self-pay | Admitting: Anesthesiology

## 2011-07-14 ENCOUNTER — Encounter (HOSPITAL_COMMUNITY): Admission: EM | Disposition: A | Discharge status: Rehabilitation Facility | Payer: Self-pay | Source: Ambulatory Visit

## 2011-07-14 ENCOUNTER — Inpatient Hospital Stay (HOSPITAL_COMMUNITY): Payer: Medicare Other | Admitting: Anesthesiology

## 2011-07-14 HISTORY — PX: TIBIA IM NAIL INSERTION: SHX2516

## 2011-07-14 HISTORY — PX: EXTERNAL FIXATION REMOVAL: SHX5040

## 2011-07-14 LAB — COMPREHENSIVE METABOLIC PANEL
ALT: 29 U/L (ref 0–35)
AST: 22 U/L (ref 0–37)
CO2: 25 mEq/L (ref 19–32)
Calcium: 9.2 mg/dL (ref 8.4–10.5)
Chloride: 100 mEq/L (ref 96–112)
Creatinine, Ser: 0.72 mg/dL (ref 0.50–1.10)
GFR calc Af Amer: >90 mL/min (ref >90)
GFR calc non Af Amer: >90 mL/min (ref >90)
Glucose, Bld: 91 mg/dL (ref 70–99)
Total Bilirubin: 0.5 mg/dL (ref 0.3–1.2)

## 2011-07-14 LAB — TYPE AND SCREEN
ABO/RH(D): O POS
Antibody Screen: NEGATIVE
Unit division: 0

## 2011-07-14 LAB — CBC
Hemoglobin: 10.9 g/dL — ABNORMAL LOW (ref 12.0–15.0)
MCH: 29.7 pg (ref 26.0–34.0)
MCV: 92.6 fL (ref 78.0–100.0)
Platelets: 487 10*3/uL — ABNORMAL HIGH (ref 150–400)
RBC: 3.67 MIL/uL — ABNORMAL LOW (ref 3.87–5.11)
WBC: 13.9 10*3/uL — ABNORMAL HIGH (ref 4.0–10.5)

## 2011-07-14 LAB — GLUCOSE, CAPILLARY
Glucose-Capillary: 108 mg/dL — ABNORMAL HIGH (ref 70–99)
Glucose-Capillary: 96 mg/dL (ref 70–99)
Glucose-Capillary: 101 mg/dL — ABNORMAL HIGH (ref 70–99)

## 2011-07-14 SURGERY — INSERTION, INTRAMEDULLARY ROD, TIBIA
Anesthesia: General | Site: Leg Lower | Laterality: Left | Wound class: Clean

## 2011-07-14 MED ORDER — BACITRACIN ZINC 500 UNIT/GM EX OINT
TOPICAL_OINTMENT | Freq: Two times a day (BID) | CUTANEOUS | Status: DC
  Administered 2011-07-14 – 2011-07-18 (×8): via TOPICAL
  Filled 2011-07-14 (×5): qty 15

## 2011-07-14 MED ORDER — HYDROMORPHONE HCL PF 1 MG/ML IJ SOLN
0.5000 mg | INTRAMUSCULAR | Status: DC | PRN
  Administered 2011-07-14 (×3): 0.5 mg via INTRAVENOUS
  Filled 2011-07-14: qty 2

## 2011-07-14 MED ORDER — CEFAZOLIN SODIUM 1-5 GM-% IV SOLN
INTRAVENOUS | Status: AC
  Filled 2011-07-14: qty 100

## 2011-07-14 MED ORDER — ROCURONIUM BROMIDE 100 MG/10ML IV SOLN
INTRAVENOUS | Status: DC | PRN
  Administered 2011-07-14: 50 mg via INTRAVENOUS

## 2011-07-14 MED ORDER — HYDROMORPHONE HCL PF 1 MG/ML IJ SOLN
INTRAMUSCULAR | Status: AC
  Administered 2011-07-14: 0.5 mg via INTRAVENOUS
  Filled 2011-07-14: qty 1

## 2011-07-14 MED ORDER — PROPOFOL 10 MG/ML IV EMUL
INTRAVENOUS | Status: DC | PRN
  Administered 2011-07-14: 130 mg via INTRAVENOUS

## 2011-07-14 MED ORDER — GLYCOPYRROLATE 0.2 MG/ML IJ SOLN
INTRAMUSCULAR | Status: DC | PRN
  Administered 2011-07-14: 0.6 mg via INTRAVENOUS

## 2011-07-14 MED ORDER — CLONAZEPAM 0.5 MG PO TABS
0.5000 mg | ORAL_TABLET | Freq: Every day | ORAL | Status: DC
  Administered 2011-07-14 – 2011-07-16 (×3): 0.5 mg
  Filled 2011-07-14 (×3): qty 1

## 2011-07-14 MED ORDER — HYDROMORPHONE HCL PF 1 MG/ML IJ SOLN
0.2500 mg | INTRAMUSCULAR | Status: DC | PRN
Start: 2011-07-14 — End: 2011-07-15
  Administered 2011-07-14 (×4): 0.5 mg via INTRAVENOUS

## 2011-07-14 MED ORDER — 0.9 % SODIUM CHLORIDE (POUR BTL) OPTIME
TOPICAL | Status: DC | PRN
  Administered 2011-07-14: 1000 mL

## 2011-07-14 MED ORDER — VECURONIUM BROMIDE 10 MG IV SOLR
INTRAVENOUS | Status: DC | PRN
  Administered 2011-07-14: 2 mg via INTRAVENOUS
  Administered 2011-07-14 (×2): 1 mg via INTRAVENOUS
  Administered 2011-07-14: 4 mg via INTRAVENOUS
  Administered 2011-07-14: 2 mg via INTRAVENOUS

## 2011-07-14 MED ORDER — LACTATED RINGERS IV SOLN
INTRAVENOUS | Status: DC | PRN
  Administered 2011-07-14 (×2): via INTRAVENOUS

## 2011-07-14 MED ORDER — ONDANSETRON HCL 4 MG/2ML IJ SOLN: 4.0000 mg | Freq: Once | INTRAMUSCULAR | Status: AC | PRN

## 2011-07-14 MED ORDER — CEFAZOLIN SODIUM 1-5 GM-% IV SOLN
INTRAVENOUS | Status: DC | PRN
  Administered 2011-07-14: 2 g via INTRAVENOUS

## 2011-07-14 MED ORDER — ONDANSETRON HCL 4 MG/2ML IJ SOLN
INTRAMUSCULAR | Status: DC | PRN
  Administered 2011-07-14: 4 mg via INTRAVENOUS

## 2011-07-14 MED ORDER — FENTANYL CITRATE 0.05 MG/ML IJ SOLN
INTRAMUSCULAR | Status: DC | PRN
  Administered 2011-07-14: 150 ug via INTRAVENOUS
  Administered 2011-07-14 (×3): 50 ug via INTRAVENOUS

## 2011-07-14 MED ORDER — QUETIAPINE FUMARATE 25 MG PO TABS
25.0000 mg | ORAL_TABLET | Freq: Three times a day (TID) | ORAL | Status: DC
  Administered 2011-07-14 – 2011-07-16 (×8): 25 mg
  Filled 2011-07-14 (×12): qty 1

## 2011-07-14 MED ORDER — MIDAZOLAM HCL 5 MG/5ML IJ SOLN
INTRAMUSCULAR | Status: DC | PRN
  Administered 2011-07-14: 2 mg via INTRAVENOUS

## 2011-07-14 MED ORDER — NEOSTIGMINE METHYLSULFATE 1 MG/ML IJ SOLN
INTRAMUSCULAR | Status: DC | PRN
  Administered 2011-07-14: 5 mg via INTRAVENOUS

## 2011-07-14 SURGICAL SUPPLY — 82 items
BANDAGE ELASTIC 4 VELCRO ST LF (GAUZE/BANDAGES/DRESSINGS) IMPLANT
BANDAGE ELASTIC 6 VELCRO ST LF (GAUZE/BANDAGES/DRESSINGS) IMPLANT
BANDAGE ESMARK 6X9 LF (GAUZE/BANDAGES/DRESSINGS) ×1 IMPLANT
BANDAGE GAUZE ELAST BULKY 4 IN (GAUZE/BANDAGES/DRESSINGS) ×2 IMPLANT
BIT DRILL 3.8X6 NS (BIT) ×2 IMPLANT
BIT DRILL 4.4 NS (BIT) ×2 IMPLANT
BLADE SURG 10 STRL SS (BLADE) IMPLANT
BNDG COHESIVE 4X5 TAN STRL (GAUZE/BANDAGES/DRESSINGS) IMPLANT
BNDG COHESIVE 6X5 TAN STRL LF (GAUZE/BANDAGES/DRESSINGS) ×2 IMPLANT
BNDG ESMARK 6X9 LF (GAUZE/BANDAGES/DRESSINGS) ×2
BRUSH SCRUB DISP (MISCELLANEOUS) ×4 IMPLANT
CLEANER TIP ELECTROSURG 2X2 (MISCELLANEOUS) ×2 IMPLANT
CLOTH BEACON ORANGE TIMEOUT ST (SAFETY) ×2 IMPLANT
COVER SURGICAL LIGHT HANDLE (MISCELLANEOUS) ×2 IMPLANT
CUFF TOURNIQUET SINGLE 18IN (TOURNIQUET CUFF) IMPLANT
CUFF TOURNIQUET SINGLE 24IN (TOURNIQUET CUFF) IMPLANT
CUFF TOURNIQUET SINGLE 34IN LL (TOURNIQUET CUFF) IMPLANT
DRAPE C-ARM 42X72 X-RAY (DRAPES) ×2 IMPLANT
DRAPE C-ARMOR (DRAPES) ×2 IMPLANT
DRAPE INCISE IOBAN 66X45 STRL (DRAPES) ×2 IMPLANT
DRAPE OEC MINIVIEW 54X84 (DRAPES) IMPLANT
DRAPE ORTHO SPLIT 77X108 STRL (DRAPES) ×2
DRAPE PROXIMA HALF (DRAPES) ×2 IMPLANT
DRAPE SURG ORHT 6 SPLT 77X108 (DRAPES) ×2 IMPLANT
DRAPE U-SHAPE 47X51 STRL (DRAPES) ×2 IMPLANT
DRSG ADAPTIC 3X8 NADH LF (GAUZE/BANDAGES/DRESSINGS) ×2 IMPLANT
DRSG PAD ABDOMINAL 8X10 ST (GAUZE/BANDAGES/DRESSINGS) ×2 IMPLANT
ELECT REM PT RETURN 9FT ADLT (ELECTROSURGICAL) ×2
ELECTRODE REM PT RTRN 9FT ADLT (ELECTROSURGICAL) ×1 IMPLANT
EVACUATOR 1/8 PVC DRAIN (DRAIN) IMPLANT
GLOVE BIO SURGEON STRL SZ7 (GLOVE) ×4 IMPLANT
GLOVE BIO SURGEON STRL SZ7.5 (GLOVE) ×6 IMPLANT
GLOVE BIO SURGEON STRL SZ8 (GLOVE) ×4 IMPLANT
GLOVE BIO SURGEON STRL SZ8.5 (GLOVE) ×2 IMPLANT
GLOVE BIOGEL PI IND STRL 7.0 (GLOVE) ×1 IMPLANT
GLOVE BIOGEL PI IND STRL 7.5 (GLOVE) ×1 IMPLANT
GLOVE BIOGEL PI IND STRL 8 (GLOVE) ×2 IMPLANT
GLOVE BIOGEL PI INDICATOR 7.0 (GLOVE) ×1
GLOVE BIOGEL PI INDICATOR 7.5 (GLOVE) ×1
GLOVE BIOGEL PI INDICATOR 8 (GLOVE) ×2
GLOVE ORTHO TXT STRL SZ7.5 (GLOVE) ×2 IMPLANT
GLOVE SURG SS PI 7.5 STRL IVOR (GLOVE) ×2 IMPLANT
GOWN PREVENTION PLUS XLARGE (GOWN DISPOSABLE) ×4 IMPLANT
GOWN STRL NON-REIN LRG LVL3 (GOWN DISPOSABLE) ×4 IMPLANT
GUIDEWIRE BALL NOSE 80CM (WIRE) ×2 IMPLANT
KIT BASIN OR (CUSTOM PROCEDURE TRAY) ×2 IMPLANT
KIT ROOM TURNOVER OR (KITS) ×2 IMPLANT
MANIFOLD NEPTUNE II (INSTRUMENTS) ×2 IMPLANT
NAIL TIBIAL 8X34.5MM (Nail) ×2 IMPLANT
NEEDLE 22X1 1/2 (OR ONLY) (NEEDLE) IMPLANT
NS IRRIG 1000ML POUR BTL (IV SOLUTION) ×2 IMPLANT
PACK GENERAL/GYN (CUSTOM PROCEDURE TRAY) ×2 IMPLANT
PACK ORTHO EXTREMITY (CUSTOM PROCEDURE TRAY) IMPLANT
PAD ARMBOARD 7.5X6 YLW CONV (MISCELLANEOUS) ×2 IMPLANT
PADDING CAST COTTON 6X4 STRL (CAST SUPPLIES) IMPLANT
PIN GUIDE ACE (PIN) ×2 IMPLANT
SCREW ACECAP 34MM (Screw) ×2 IMPLANT
SCREW PROXIMAL DEPUY (Screw) ×2 IMPLANT
SCREW PRXML FT 60X5.5XNS LF (Screw) ×2 IMPLANT
SPONGE GAUZE 4X4 12PLY (GAUZE/BANDAGES/DRESSINGS) IMPLANT
SPONGE LAP 18X18 X RAY DECT (DISPOSABLE) ×2 IMPLANT
SPONGE SCRUB IODOPHOR (GAUZE/BANDAGES/DRESSINGS) ×2 IMPLANT
STAPLER VISISTAT 35W (STAPLE) ×2 IMPLANT
STOCKINETTE IMPERVIOUS LG (DRAPES) IMPLANT
STRIP CLOSURE SKIN 1/2X4 (GAUZE/BANDAGES/DRESSINGS) IMPLANT
SUCTION FRAZIER TIP 10 FR DISP (SUCTIONS) IMPLANT
SUT ETHILON 3 0 PS 1 (SUTURE) ×4 IMPLANT
SUT PDS AB 2-0 CT1 27 (SUTURE) IMPLANT
SUT PROLENE 3 0 PS 2 (SUTURE) IMPLANT
SUT VIC AB 0 CT1 27 (SUTURE)
SUT VIC AB 0 CT1 27XBRD ANBCTR (SUTURE) IMPLANT
SUT VIC AB 2-0 CT1 27 (SUTURE) ×2
SUT VIC AB 2-0 CT1 TAPERPNT 27 (SUTURE) ×2 IMPLANT
SUT VIC AB 2-0 CT3 27 (SUTURE) IMPLANT
SYR CONTROL 10ML LL (SYRINGE) IMPLANT
TOWEL OR 17X24 6PK STRL BLUE (TOWEL DISPOSABLE) ×2 IMPLANT
TOWEL OR 17X26 10 PK STRL BLUE (TOWEL DISPOSABLE) ×4 IMPLANT
TRAY FOLEY CATH 14FR (SET/KITS/TRAYS/PACK) IMPLANT
TUBE CONNECTING 12X1/4 (SUCTIONS) ×2 IMPLANT
UNDERPAD 30X30 INCONTINENT (UNDERPADS AND DIAPERS) ×2 IMPLANT
WATER STERILE IRR 1000ML POUR (IV SOLUTION) IMPLANT
YANKAUER SUCT BULB TIP NO VENT (SUCTIONS) ×2 IMPLANT

## 2011-07-14 NOTE — Progress Notes (Signed)
Patient ID: Tonya [name], female   DOB: [birth year], 26 y.o.   MRN: [id number]   LOS: 17 days   Subjective: No significant c/o. Says voice is stronger today. Awaits next swallow study so panda can be removed. Feels like she can hear "rattle" when she breaths. Has a productive cough but swallows sputum. Denies chest pain or fever.    Objective: Vital signs in last 24 hours: Temp:  [98 F (36.7 C)-99.7 F (37.6 C)] 98.5 F (36.9 C) (06/14 0601) Pulse Rate:  [82-94] 82  (06/14 0601) Resp:  [18-31] 18  (06/14 0601) BP: (103-120)/(54-75) 107/65 mmHg (06/14 0601) SpO2:  [91 %-98 %] 98 % (06/14 0601) Weight:  [112.8 kg (248 lb 10.9 oz)] 112.8 kg (248 lb 10.9 oz) (06/14 0500) Last BM Date: 07/12/11  Lab Results:  CBC -- WBC holding at 13.9 Hemoglobin 10.9 CMP -- transaminases WNL, lytes WNL CXR -- clear, pending radiology reports.    General appearance: alert and no distress, OX3, intact memory Resp: mild rhonchi LLL Cardio: regular rate and rhythm GI: normal findings: bowel sounds normal and soft, non-tender Extremities: NVI    Assessment/Plan: PHBC   R traumatic trans-knee AKA - Closed L tib fib Fx s/p ex fix -- OR today by Dr. Carola Frost for internal fix Left 2nd MC fx -- Splint, WBAT ?L adrenal hemorrhage  Multiple abrasions - Xeroform and local care R forearm 5th digit extensor tendon injury s/p repair -- WBAT, splint removed, ACE wrap only Traumatic hernia R lateral abdominal wall - elective repair  Suicidal/bipolar - Needs IP psych treatment at some point according to psych. Competent to make decisions for herself. ABL anemia - Patient received transfusion yesterday to facilitate ORIF LLE. FEN -- TF are being held for surgery today. Leave foley until after OR. Liver function and lytes normal. VTE - Lovenox  Dispo -- OR today    Tonya Lightner PA-S 8:11 AM   07/14/2011

## 2011-07-14 NOTE — Anesthesia Postprocedure Evaluation (Signed)
  Anesthesia Post-op Note  Patient: Tonya [name]  Procedure(s) Performed: Procedure(s) (LRB): INTRAMEDULLARY (IM) NAIL TIBIAL (Left) REMOVAL EXTERNAL FIXATION LEG (Left)  Patient Location: PACU  Anesthesia Type: General  Level of Consciousness: awake, alert  and oriented  Airway and Oxygen Therapy: Patient Spontanous Breathing and Patient connected to nasal cannula oxygen  Post-op Pain: moderate  Post-op Assessment: Post-op Vital signs reviewed  Post-op Vital Signs: Reviewed  Complications: No apparent anesthesia complications

## 2011-07-14 NOTE — Progress Notes (Signed)
UR completed.   Await surgery today and CIR consult to determine d/c dispo.
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
## 2011-07-14 NOTE — Progress Notes (Signed)
Speech Language Pathology Dysphagia Treatment Patient Details Name: Tonya [name] MRN: [id number] DOB: [birth year] Today's Date: 07/14/2011 Time: 4098-1191 SLP Time Calculation (min): 8 min  Assessment / Plan / Recommendation Clinical Impression  Pt. seen today for diagnostic assessment of current vocal quality, management of secretions and recommendation for repeat objective swallow evaluation.  Session was brief as pt. being prepped to go to OR for surgery earlier than initially expected.  Tonya [name] was able to produce low intensity and intermittent verbalizations indictive of slight degree of vocal cord adduction.  No  coughing with secretions observed.  Pt. is appropriate to repeat FEES Monday 6/17 and discussed with trauma PA-C and received order.  Pt. in agreement awith plan .  Hopefully pt. will be able to be extubated promptly after surgery today and not have to remain intubated.      Diet Recommendation       SLP Plan FEES (FEES 6/17)      Swallowing Goals  SLP Swallowing Goals Swallow Study Goal #3 - Progress: Met (New goals to be set after repeat FEES mon)  General Respiratory Status: Room air Behavior/Cognition: Alert;Cooperative;Pleasant mood Oral Cavity - Dentition: Adequate natural dentition Patient Positioning: Upright in bed  Oral Cavity - Oral Hygiene Brush patient's teeth BID with toothbrush (using toothpaste with fluoride): Yes Patient is AT RISK - Oral Care Protocol followed (see row info): Yes   Dysphagia Treatment Treatment focused on: Patient/family/caregiver education Family/Caregiver Educated: see note below Treatment Methods/Modalities: Differential diagnosis Patient observed directly with PO's: No Reason PO's not observed: NPO for procedure/test   Tonya [name] 07/14/2011, 12:11 PM

## 2011-07-14 NOTE — Transfer of Care (Signed)
Immediate Anesthesia Transfer of Care Note  Patient: Tonya [name]  Procedure(s) Performed: Procedure(s) (LRB): INTRAMEDULLARY (IM) NAIL TIBIAL (Left) REMOVAL EXTERNAL FIXATION LEG (Left)  Patient Location: PACU  Anesthesia Type: General  Level of Consciousness: awake  Airway & Oxygen Therapy: Patient Spontanous Breathing and Patient connected to face mask oxygen  Post-op Assessment: Report given to PACU RN and Post -op Vital signs reviewed and stable  Post vital signs: Reviewed and stable  Complications: No apparent anesthesia complications

## 2011-07-14 NOTE — Progress Notes (Signed)
Orthopedic Tech Progress Note Patient Details:  Tonya [name] 02/04/1985 [id number]  Ortho Devices Type of Ortho Device:  (prafo boot) Ortho Device/Splint Location: left hand Ortho Device/Splint Interventions: Tonya [name], Tonya [name] 07/14/2011, 4:52 PM

## 2011-07-14 NOTE — Anesthesia Procedure Notes (Signed)
Procedure Name: Intubation Date/Time: 07/14/2011 1:40 PM Performed by: Romie Minus K Pre-anesthesia Checklist: Patient identified, Emergency Drugs available, Suction available, Patient being monitored and Timeout performed Patient Re-evaluated:Patient Re-evaluated prior to inductionOxygen Delivery Method: Circle system utilized Preoxygenation: Pre-oxygenation with 100% oxygen Intubation Type: IV induction Ventilation: Mask ventilation without difficulty and Oral airway inserted - appropriate to patient size Laryngoscope Size: Miller and 2 Grade View: Grade I Tube type: Oral Tube size: 7.5 mm Number of attempts: 1 Airway Equipment and Method: Stylet Placement Confirmation: ETT inserted through vocal cords under direct vision,  positive ETCO2 and breath sounds checked- equal and bilateral Secured at: 21 cm Tube secured with: Tape Dental Injury: Teeth and Oropharynx as per pre-operative assessment

## 2011-07-14 NOTE — Clinical Social Work Note (Signed)
Per psychiatry note, patient does have the capacity to complete advanced directives.  Clinical Social Worker met with patient and patient fiance at bedside with case manager Carlyle Lipa) present to discuss advanced directives packet.  Patient has requested to complete living will, Healthcare Power of [place], and Advanced Instruction for Mental Health Treatment.  Patient is very clear in her wishes with appropriate judgement and insight regarding future needs for both physical and emotional.  CSW completed advanced directives with patient - notarized with a copy on the shadow chart and original/copies with patient.  Patient HCPOA is Dameron Hulan Fess (fiance).  Patient has also verbalized her wishes for her mother and brother to no longer be involved in the decision making process.  Patient requests that medical information is to not be shared with them from hospital staff - PLEASE DIRECT ALL CONVERSATION WITH PATIENT FAMILY REGARDING PATIENT CURRENT MEDICAL STATUS TO PATIENT DIRECTLY.  Patient was very clear in stating that she would like for her HCPOA to make all decisions including those surrounding the issue of receiving blood products.  Patient is upbeat and feels encouraged at this time about her possible outcomes for both physical and emotional therapy following her hospital stay.  Patient has acknowledged the loss of her right leg and seems to be appropriately managing at this time.  Patient is being closely followed by psychiatry service for further inpatient psychiatric needs.  Patient and patient fiance understand the need for rehab at discharge.  Clinical Social Worker will continue to follow up with patient and patient fiance to offer continued support and facilitate appropriate discharge needs.  [address] [place], [place] [phone]

## 2011-07-14 NOTE — Preoperative (Signed)
Beta Blockers   Reason not to administer Beta Blockers:Not Applicable 

## 2011-07-14 NOTE — Progress Notes (Signed)
Nutrition Follow-up  Diet Order:  NPO  Pt not available at time of visit- in OR for internal fixation. Prior to being placed NPO for surgery, pt continued TFs of Pivot 1.5 @ 50 mL/hr which provides 1800 kcal, 112g protein meeting 100% estimated increased needs. Plan is to resume TFs if access remains post-op as pt is not ready for POs at this time.  SLP planning for repeat FEES study Monday.  Meds: Scheduled Meds:   . alteplase  2 mg Intracatheter Once  . alteplase  2 mg Intracatheter Once  . alteplase  2 mg Intracatheter Once  . alteplase  2 mg Intracatheter Once  . antiseptic oral rinse  15 mL Mouth Rinse QID  . bacitracin   Topical BID  . bisacodyl  10 mg Rectal Daily  .  ceFAZolin (ANCEF) IV  1 g Intravenous Once  . chlorhexidine  15 mL Mouth Rinse BID  . citalopram  30 mg Per Tube Daily  . clonazePAM  0.5 mg Per Tube QHS  . docusate  100 mg Oral BID  . enoxaparin (LOVENOX) injection  30 mg Subcutaneous Q12H  . feeding supplement (PIVOT 1.5 CAL)  1,000 mL Per Tube Q24H  . ferrous sulfate  300 mg Oral TID WC  . furosemide  20 mg Intravenous Once  . insulin aspart  0-20 Units Subcutaneous Q4H  . metoprolol tartrate  12.5 mg Oral BID  . neomycin-bacitracin-polymyxin   Topical Daily  . pantoprazole sodium  40 mg Per Tube Q1200  . polyethylene glycol  17 g Per Tube Daily  . QUEtiapine  25 mg Per Tube TID  . sodium chloride  10-40 mL Intracatheter Q12H  . DISCONTD: clonazePAM  0.5 mg Per Tube Q12H  . DISCONTD: QUEtiapine  50 mg Per Tube Q12H   Continuous Infusions:   . dextrose 5 % and 0.45 % NaCl with KCl 20 mEq/L Stopped (07/13/11 1100)  . lactated ringers 50 mL/hr at 07/13/11 1900   PRN Meds:.acetaminophen, albuterol, fentaNYL, haloperidol lactate, HYDROcodone-acetaminophen, midazolam, morphine injection, ondansetron (ZOFRAN) IV, ondansetron, phenol, sodium chloride  Labs:  CMP     Component Value Date/Time   NA 138 07/14/2011 0413   K 3.6 07/14/2011 0413   CL 100  07/14/2011 0413   CO2 25 07/14/2011 0413   GLUCOSE 91 07/14/2011 0413   BUN 16 07/14/2011 0413   CREATININE 0.72 07/14/2011 0413   CALCIUM 9.2 07/14/2011 0413   PROT 7.0 07/14/2011 0413   ALBUMIN 2.6* 07/14/2011 0413   AST 22 07/14/2011 0413   ALT 29 07/14/2011 0413   ALKPHOS 180* 07/14/2011 0413   BILITOT 0.5 07/14/2011 0413   GFRNONAA >90 07/14/2011 0413   GFRAA >90 07/14/2011 0413     Intake/Output Summary (Last 24 hours) at 07/14/11 1417 Last data filed at 07/14/11 1610  Gross per 24 hour  Intake    550 ml  Output   2900 ml  Net  -2350 ml    Weight Status:  248 lbs Admission wt: 242 lbs  Re-statement of needs:  1800-2000 kcal, 100-115g protein  Nutrition Dx:  Inadequate oral intake, ongoing.  Intervention:   Continue current management.  Resume TFs at goal rate post-op. Will follow for result of FEES and appropriateness for PO diet.  Monitor:   1.  Enteral nutrition; continued tolerance with pt meeting 100% of needs. Met, continue. 2.  Wt/wt change; trends. Met, currently stable   Hoyt Koch Pager #:  [phone]

## 2011-07-14 NOTE — Brief Op Note (Signed)
06/27/2011 - 07/14/2011  4:56 PM  PATIENT:  Tonya [name]  26 y.o. female  PRE-OPERATIVE DIAGNOSIS:  left segmental tibial fracture, retained external fixator  POST-OPERATIVE DIAGNOSIS:  left segmental tibial fracture, retained external fixator  PROCEDURE:  Procedure(s) (LRB): 1.INTRAMEDULLARY (IM) NAIL TIBIAL (Left) w Biomet 8x345 statically locked w 60mm x 2 proximal locks and 34mm x 2 distal locks 2. REMOVAL EXTERNAL FIXATION LEG (Left) 3. Debridements ulcerated pin sites tibia  SURGEON:  Surgeon(s) and Role:    * Budd Palmer, MD - Primary  PHYSICIAN ASSISTANT: Montez Morita, [facility]  ANESTHESIA:   general  EBL:  Total I/O In: 1200 [I.V.:1200] Out: 300 [Urine:250; Blood:50]  BLOOD ADMINISTERED:none  DRAINS: none   LOCAL MEDICATIONS USED:  NONE  SPECIMEN:  No Specimen  DISPOSITION OF SPECIMEN:  N/A  COUNTS:  YES  TOURNIQUET:  * No tourniquets in log *  DICTATION: .Other Dictation: Dictation Number [id number]  PLAN OF CARE: Admit to inpatient   PATIENT DISPOSITION:  PACU - hemodynamically stable.   Delay start of Pharmacological VTE agent (>24hrs) due to surgical blood loss or risk of bleeding: no

## 2011-07-15 DIAGNOSIS — F339 Major depressive disorder, recurrent, unspecified: Secondary | ICD-10-CM

## 2011-07-15 LAB — BASIC METABOLIC PANEL
Calcium: 8.9 mg/dL (ref 8.4–10.5)
Creatinine, Ser: 0.75 mg/dL (ref 0.50–1.10)
GFR calc non Af Amer: >90 mL/min (ref >90)
Glucose, Bld: 122 mg/dL — ABNORMAL HIGH (ref 70–99)
Sodium: 135 mEq/L (ref 135–145)

## 2011-07-15 LAB — CBC
Hemoglobin: 10.7 g/dL — ABNORMAL LOW (ref 12.0–15.0)
MCH: 29.6 pg (ref 26.0–34.0)
MCHC: 31.6 g/dL (ref 30.0–36.0)
MCV: 93.9 fL (ref 78.0–100.0)

## 2011-07-15 LAB — GLUCOSE, CAPILLARY
Glucose-Capillary: 107 mg/dL — ABNORMAL HIGH (ref 70–99)
Glucose-Capillary: 123 mg/dL — ABNORMAL HIGH (ref 70–99)
Glucose-Capillary: 89 mg/dL (ref 70–99)
Glucose-Capillary: 138 mg/dL — ABNORMAL HIGH (ref 70–99)
Glucose-Capillary: 101 mg/dL — ABNORMAL HIGH (ref 70–99)

## 2011-07-15 MED ORDER — DIPHENHYDRAMINE HCL 50 MG/ML IJ SOLN
25.0000 mg | Freq: Four times a day (QID) | INTRAMUSCULAR | Status: DC | PRN
  Administered 2011-07-15 – 2011-07-18 (×9): 25 mg via INTRAVENOUS
  Filled 2011-07-15 (×9): qty 1

## 2011-07-15 MED ORDER — HYDROMORPHONE HCL PF 1 MG/ML IJ SOLN
2.0000 mg | INTRAMUSCULAR | Status: DC | PRN
  Administered 2011-07-15 – 2011-07-17 (×11): 2 mg via INTRAVENOUS
  Filled 2011-07-15 (×5): qty 2
  Filled 2011-07-15: qty 1
  Filled 2011-07-15: qty 2
  Filled 2011-07-15: qty 1
  Filled 2011-07-15 (×3): qty 2
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
## 2011-07-15 NOTE — Progress Notes (Signed)
Physical Therapy Treatment Patient Details Name: Tonya [name] MRN: [id number] DOB: 12/15/1985 Today's Date: 07/15/2011 Time:  - 1447   Visit Information  Last PT Received On: 07/15/11 Reason Eval/Treat Not Completed: Patient refused (Patient refused PT x2 due to pain level.  )    [name]. Earlene Plater PT, [facility] Acute Rehab Services Pager [phone]             Vena Austria 07/15/2011, 2:47 PM

## 2011-07-15 NOTE — Progress Notes (Signed)
1 Day Post-Op  Subjective: S/p IM nail Pain in left leg Voice seems stronger  Objective: Vital signs in last 24 hours: Temp:  [97.4 F (36.3 C)-99 F (37.2 C)] 98.6 F (37 C) (06/15 0610) Pulse Rate:  [78-93] 91  (06/15 0610) Resp:  [15-30] 18  (06/15 0610) BP: (109-124)/(63-78) 124/69 mmHg (06/15 0610) SpO2:  [96 %-100 %] 98 % (06/15 0610) Weight:  [245 lb 9.5 oz (111.4 kg)] 245 lb 9.5 oz (111.4 kg) (06/15 0500) Last BM Date: 07/12/11  Intake/Output from previous day: 06/14 0701 - 06/15 0700 In: 1700 [I.V.:1700] Out: 400 [Urine:350; Blood:50] Intake/Output this shift:    General appearance: alert, cooperative and no distress Resp: clear to auscultation bilaterally GI: soft, non-tender; bowel sounds normal; no masses,  no organomegaly  Lab Results:   [facility] 07/15/11 0649 07/14/11 0413  WBC 13.0* 13.9*  HGB 10.7* 10.9*  HCT 33.9* 34.0*  PLT 465* 487*   BMET  Basename 07/15/11 0649 07/14/11 0413  NA 135 138  K 4.5 3.6  CL 101 100  CO2 26 25  GLUCOSE 122* 91  BUN 18 16  CREATININE 0.75 0.72  CALCIUM 8.9 9.2   PT/INR No results found for this basename: LABPROT:2,INR:2 in the last 72 hours ABG No results found for this basename: PHART:2,PCO2:2,PO2:2,HCO3:2 in the last 72 hours  Studies/Results: Dg Tibia/fibula Left  07/14/2011  *RADIOLOGY REPORT*  Clinical Data: ORIF left tibia.  DG C-ARM 61-120 MIN,LEFT TIBIA AND FIBULA - 2 VIEW  Technique: Eight intraoperative fluoroscopic spot films are submitted for interpretation.  Comparison:  07/10/2011.  Findings: Placement of an antegrade femoral nail is present. Proximal interlocking screws are present.  Improved alignment of the fracture, near anatomic.  Transverse fracture in the mid shaft is visualized.  IMPRESSION: Antegrade left tibial nail.  Original Report Authenticated By: Andreas Newport, M.D.   Dg Chest Port 1 View  07/14/2011  *RADIOLOGY REPORT*  Clinical Data: Hypoxia  PORTABLE CHEST - 1 VIEW  Comparison:  Portable chest x-ray of 07/10/2011  Findings: The lungs are much better aerated.  Airspace disease has resolved.  The heart is within normal limits in size.  Right PICC line remains and a feeding tube is present.  IMPRESSION: Significant improvement in aeration with resolution of airspace disease.  Original Report Authenticated By: Juline Patch, M.D.   Dg Tibia/fibula Left Port  07/14/2011  *RADIOLOGY REPORT*  Clinical Data: Postoperative films status post ORIF for tibial fracture.  PORTABLE LEFT TIBIA AND FIBULA - 2 VIEW  Comparison: Multiple priors, most recently 07/10/2011 and intraoperative films 07/14/2011.  Findings: Postoperative changes of intramedullary rod and screw fixation are noted.  There are two proximal and two distal fixation screws.  Comminuted fracture of the mid tibia, and oblique fracture of the proximal tibial diaphysis are again noted, and appear unchanged (but are vastly improved compared to the preoperative radiograph 07/10/2011). Small avulsion fracture fragment is again noted next to the proximal left fibula.  Overlying soft tissues are irregular, likely related to post-traumatic and postoperative edema.  IMPRESSION: 1.  Status post ORIF for left tibial fractures, as above. 2.  Small avulsion fracture from the left fibular head.  Original Report Authenticated By: Florencia Reasons, M.D.   Dg C-arm [phone] Min  07/14/2011  *RADIOLOGY REPORT*  Clinical Data: ORIF left tibia.  DG C-ARM 61-120 MIN,LEFT TIBIA AND FIBULA - 2 VIEW  Technique: Eight intraoperative fluoroscopic spot films are submitted for interpretation.  Comparison:  07/10/2011.  Findings: Placement of  an antegrade femoral nail is present. Proximal interlocking screws are present.  Improved alignment of the fracture, near anatomic.  Transverse fracture in the mid shaft is visualized.  IMPRESSION: Antegrade left tibial nail.  Original Report Authenticated By: Andreas Newport, M.D.    Anti-infectives: Anti-infectives      Start     Dose/Rate Route Frequency Ordered Stop   07/13/11 0800   ceFAZolin (ANCEF) IVPB 1 g/50 mL premix        1 g 100 mL/hr over 30 Minutes Intravenous  Once 07/13/11 0830     07/06/11 0200   vancomycin (VANCOCIN) IVPB 1000 mg/200 mL premix  Status:  Discontinued        1,000 mg 200 mL/hr over 60 Minutes Intravenous Every 8 hours 07/05/11 1920 07/10/11 1144   07/04/11 1000   vancomycin (VANCOCIN) 1,250 mg in sodium chloride 0.9 % 250 mL IVPB  Status:  Discontinued        1,250 mg 166.7 mL/hr over 90 Minutes Intravenous Every 8 hours 07/04/11 0807 07/05/11 1920   07/04/11 0900   piperacillin-tazobactam (ZOSYN) IVPB 3.375 g  Status:  Discontinued        3.375 g 12.5 mL/hr over 240 Minutes Intravenous 3 times per day 07/04/11 0748 07/10/11 1144   06/27/11 1400   ceFAZolin (ANCEF) IVPB 1 g/50 mL premix  Status:  Discontinued        1 g 100 mL/hr over 30 Minutes Intravenous 3 times per day 06/27/11 1056 07/04/11 0748   06/27/11 1300   gentamicin (GARAMYCIN) 530 mg in dextrose 5 % 100 mL IVPB  Status:  Discontinued        530 mg 113.3 mL/hr over 60 Minutes Intravenous Every 24 hours 06/27/11 1214 07/04/11 0748          Assessment/Plan: s/p Procedure(s) (LRB): INTRAMEDULLARY (IM) NAIL TIBIAL (Left) REMOVAL EXTERNAL FIXATION LEG (Left) Restart tube feeds Swallow eval Monday   LOS: 18 days    Javel Hersh K. 07/15/2011

## 2011-07-16 LAB — GLUCOSE, CAPILLARY
Glucose-Capillary: 107 mg/dL — ABNORMAL HIGH (ref 70–99)
Glucose-Capillary: 109 mg/dL — ABNORMAL HIGH (ref 70–99)
Glucose-Capillary: 130 mg/dL — ABNORMAL HIGH (ref 70–99)

## 2011-07-16 NOTE — Progress Notes (Signed)
Physical Therapy Treatment (Re-eval) Patient Details Name: Tonya [name] MRN: [id number] DOB: 1985/06/22 Today's Date: 07/16/2011 Time: 4742-5956 PT Time Calculation (min): 24 min  PT Assessment / Plan / Recommendation Comments on Treatment Session  Patient limited by pain and lightheadedness.  Was able to sit on EOB with min support. Goals from 07/11/11 remain appropriate.  Will continue to follow acutely for mobility and transfer training.    Follow Up Recommendations  Skilled nursing facility    Barriers to Discharge        Equipment Recommendations  Defer to next venue    Recommendations for Other Services    Frequency Min 3X/week   Plan Discharge plan remains appropriate;Frequency remains appropriate    Precautions / Restrictions Precautions Precautions: Fall Restrictions Weight Bearing Restrictions: Yes RUE Weight Bearing: Weight bearing as tolerated LUE Weight Bearing: Weight bearing as tolerated LLE Weight Bearing: Non weight bearing       Mobility  Bed Mobility Bed Mobility: Rolling Right;Rolling Left;Supine to Sit;Sit to Supine;Scooting to [facility] Rolling Right: 1: +2 Total assist Rolling Right: Patient Percentage: 30% Rolling Left: 1: +2 Total assist Rolling Left: Patient Percentage: 30% Right Sidelying to Sit: 1: +2 Total assist;HOB elevated Right Sidelying to Sit: Patient Percentage: 30% Sit to Supine: 1: +2 Total assist Sit to Supine: Patient Percentage: 20% Scooting to HOB: 1: +2 Total assist Scooting to [facility]: Patient Percentage: 0% Details for Bed Mobility Assistance: Verbal cues for technique.  Patient able to use RUE to assist with mobility. Transfers Transfers: Not assessed     PT Goals Acute Rehab PT Goals PT Goal: Supine/Side to Sit - Progress: Progressing toward goal PT Goal: Sit at [facility] Of Bed - Progress: Progressing toward goal PT Goal: Sit to Supine/Side - Progress: Progressing toward goal  Visit Information  Last PT Received On:  07/16/11 Assistance Needed: +2    Subjective Data  Subjective: "We can try today."   Cognition  Overall Cognitive Status: Appears within functional limits for tasks assessed/performed Arousal/Alertness: Awake/alert Orientation Level: Appears intact for tasks assessed Behavior During Session: [facility] for tasks performed    Balance  Balance Balance Assessed: Yes Static Sitting Balance Static Sitting - Balance Support: Right upper extremity supported;Feet unsupported Static Sitting - Level of Assistance: 4: Min assist Static Sitting - Comment/# of Minutes: Patient initially required assist to get to midline sitting position.  Then patient maintained sitting balance x3 minutes with min assist.  Patient became lightheaded and returned to supine.  End of Session PT - End of Session Activity Tolerance: Patient limited by pain;Patient limited by fatigue;Treatment limited secondary to medical complications (Comment) (lightheadedness) Patient left: in bed;with call bell/phone within reach (with sitter in room) Nurse Communication: Mobility status    Vena Austria 07/16/2011, 12:18 PM [name]. Renaldo Fiddler, [facility] Acute Rehab Services Pager [phone]

## 2011-07-16 NOTE — Progress Notes (Signed)
Foley catheter DC per Trauma MD.  Patient was able to void using bedpan.

## 2011-07-16 NOTE — Progress Notes (Signed)
2 Days Post-Op  Subjective: Feels better, tolerating tube feeds, no BM.  Asking for foley to be removed  Objective: Vital signs in last 24 hours: Temp:  [98.1 F (36.7 C)-98.8 F (37.1 C)] 98.4 F (36.9 C) (06/16 0558) Pulse Rate:  [72-91] 91  (06/16 0558) Resp:  [16] 16  (06/16 0558) BP: (111-117)/(60-66) 117/66 mmHg (06/16 0558) SpO2:  [95 %-99 %] 99 % (06/16 0558) Weight:  [236 lb 12.4 oz (107.4 kg)] 236 lb 12.4 oz (107.4 kg) (06/16 0558) Last BM Date: 07/12/11  Intake/Output from previous day: 06/15 0701 - 06/16 0700 In: 0  Out: 1600 [Urine:1600] Intake/Output this shift:    General appearance: alert, cooperative and no distress Throat: voice raspy Resp: nonlabored Cardio: normal rate, regular  GI: soft, non-tender; bowel sounds normal; no masses,  no organomegaly Extremities: dressings in place  Lab Results:   [facility] 07/15/11 0649 07/14/11 0413  WBC 13.0* 13.9*  HGB 10.7* 10.9*  HCT 33.9* 34.0*  PLT 465* 487*   BMET  Basename 07/15/11 0649 07/14/11 0413  NA 135 138  K 4.5 3.6  CL 101 100  CO2 26 25  GLUCOSE 122* 91  BUN 18 16  CREATININE 0.75 0.72  CALCIUM 8.9 9.2   PT/INR No results found for this basename: LABPROT:2,INR:2 in the last 72 hours ABG No results found for this basename: PHART:2,PCO2:2,PO2:2,HCO3:2 in the last 72 hours  Studies/Results: Dg Tibia/fibula Left  07/14/2011  *RADIOLOGY REPORT*  Clinical Data: ORIF left tibia.  DG C-ARM 61-120 MIN,LEFT TIBIA AND FIBULA - 2 VIEW  Technique: Eight intraoperative fluoroscopic spot films are submitted for interpretation.  Comparison:  07/10/2011.  Findings: Placement of an antegrade femoral nail is present. Proximal interlocking screws are present.  Improved alignment of the fracture, near anatomic.  Transverse fracture in the mid shaft is visualized.  IMPRESSION: Antegrade left tibial nail.  Original Report Authenticated By: Andreas Newport, M.D.   Dg Tibia/fibula Left Port  07/14/2011   *RADIOLOGY REPORT*  Clinical Data: Postoperative films status post ORIF for tibial fracture.  PORTABLE LEFT TIBIA AND FIBULA - 2 VIEW  Comparison: Multiple priors, most recently 07/10/2011 and intraoperative films 07/14/2011.  Findings: Postoperative changes of intramedullary rod and screw fixation are noted.  There are two proximal and two distal fixation screws.  Comminuted fracture of the mid tibia, and oblique fracture of the proximal tibial diaphysis are again noted, and appear unchanged (but are vastly improved compared to the preoperative radiograph 07/10/2011). Small avulsion fracture fragment is again noted next to the proximal left fibula.  Overlying soft tissues are irregular, likely related to post-traumatic and postoperative edema.  IMPRESSION: 1.  Status post ORIF for left tibial fractures, as above. 2.  Small avulsion fracture from the left fibular head.  Original Report Authenticated By: Florencia Reasons, M.D.   Dg C-arm [phone] Min  07/14/2011  *RADIOLOGY REPORT*  Clinical Data: ORIF left tibia.  DG C-ARM 61-120 MIN,LEFT TIBIA AND FIBULA - 2 VIEW  Technique: Eight intraoperative fluoroscopic spot films are submitted for interpretation.  Comparison:  07/10/2011.  Findings: Placement of an antegrade femoral nail is present. Proximal interlocking screws are present.  Improved alignment of the fracture, near anatomic.  Transverse fracture in the mid shaft is visualized.  IMPRESSION: Antegrade left tibial nail.  Original Report Authenticated By: Andreas Newport, M.D.    Anti-infectives: Anti-infectives     Start     Dose/Rate Route Frequency Ordered Stop   07/13/11 0800   ceFAZolin (ANCEF) IVPB  1 g/50 mL premix        1 g 100 mL/hr over 30 Minutes Intravenous  Once 07/13/11 0830     07/06/11 0200   vancomycin (VANCOCIN) IVPB 1000 mg/200 mL premix  Status:  Discontinued        1,000 mg 200 mL/hr over 60 Minutes Intravenous Every 8 hours 07/05/11 1920 07/10/11 1144   07/04/11 1000    vancomycin (VANCOCIN) 1,250 mg in sodium chloride 0.9 % 250 mL IVPB  Status:  Discontinued        1,250 mg 166.7 mL/hr over 90 Minutes Intravenous Every 8 hours 07/04/11 0807 07/05/11 1920   07/04/11 0900   piperacillin-tazobactam (ZOSYN) IVPB 3.375 g  Status:  Discontinued        3.375 g 12.5 mL/hr over 240 Minutes Intravenous 3 times per day 07/04/11 0748 07/10/11 1144   06/27/11 1400   ceFAZolin (ANCEF) IVPB 1 g/50 mL premix  Status:  Discontinued        1 g 100 mL/hr over 30 Minutes Intravenous 3 times per day 06/27/11 1056 07/04/11 0748   06/27/11 1300   gentamicin (GARAMYCIN) 530 mg in dextrose 5 % 100 mL IVPB  Status:  Discontinued        530 mg 113.3 mL/hr over 60 Minutes Intravenous Every 24 hours 06/27/11 1214 07/04/11 0748          Assessment/Plan: s/p Procedure(s) (LRB): INTRAMEDULLARY (IM) NAIL TIBIAL (Left) REMOVAL EXTERNAL FIXATION LEG (Left) Tube feeds until swallow okay. plan for repeat swallow eval tomorrow.  she is asking for foley to be removed and I have discussed this with her nurse who feels that they can accommodate her with this.  continue PT/OT, sutures out of right hand per ortho  LOS: 19 days    Alle Difabio DAVID 07/16/2011
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
## 2011-07-17 ENCOUNTER — Encounter (HOSPITAL_COMMUNITY): Payer: Self-pay | Admitting: Orthopedic Surgery

## 2011-07-17 LAB — GLUCOSE, CAPILLARY
Glucose-Capillary: 110 mg/dL — ABNORMAL HIGH (ref 70–99)
Glucose-Capillary: 110 mg/dL — ABNORMAL HIGH (ref 70–99)
Glucose-Capillary: 121 mg/dL — ABNORMAL HIGH (ref 70–99)
Glucose-Capillary: 127 mg/dL — ABNORMAL HIGH (ref 70–99)
Glucose-Capillary: 97 mg/dL (ref 70–99)

## 2011-07-17 MED ORDER — DOCUSATE SODIUM 50 MG/5ML PO LIQD
100.0000 mg | Freq: Two times a day (BID) | ORAL | Status: DC
  Filled 2011-07-17 (×2): qty 10

## 2011-07-17 MED ORDER — CITALOPRAM HYDROBROMIDE 20 MG PO TABS
30.0000 mg | ORAL_TABLET | Freq: Every day | ORAL | Status: DC
  Administered 2011-07-17 – 2011-07-18 (×2): 30 mg via ORAL
  Filled 2011-07-17 (×2): qty 1

## 2011-07-17 MED ORDER — HYDROMORPHONE HCL PF 1 MG/ML IJ SOLN
1.0000 mg | INTRAMUSCULAR | Status: DC | PRN
  Administered 2011-07-17 – 2011-07-18 (×4): 1 mg via INTRAVENOUS
  Filled 2011-07-17 (×4): qty 1

## 2011-07-17 MED ORDER — DOCUSATE SODIUM 50 MG/5ML PO LIQD
200.0000 mg | Freq: Two times a day (BID) | ORAL | Status: DC
  Filled 2011-07-17 (×2): qty 20

## 2011-07-17 MED ORDER — FERROUS SULFATE 300 (60 FE) MG/5ML PO SYRP
300.0000 mg | ORAL_SOLUTION | Freq: Three times a day (TID) | ORAL | Status: DC
  Filled 2011-07-17 (×4): qty 5

## 2011-07-17 MED ORDER — DOCUSATE SODIUM 100 MG PO CAPS
200.0000 mg | ORAL_CAPSULE | Freq: Two times a day (BID) | ORAL | Status: DC
  Administered 2011-07-17 – 2011-07-18 (×4): 200 mg via ORAL
  Filled 2011-07-17 (×6): qty 2

## 2011-07-17 MED ORDER — STARCH (THICKENING) PO POWD
ORAL | Status: DC | PRN
  Filled 2011-07-17: qty 227

## 2011-07-17 MED ORDER — FERROUS SULFATE 325 (65 FE) MG PO TABS
325.0000 mg | ORAL_TABLET | Freq: Three times a day (TID) | ORAL | Status: DC
  Administered 2011-07-17 – 2011-07-19 (×7): 325 mg via ORAL
  Filled 2011-07-17 (×9): qty 1

## 2011-07-17 MED ORDER — HYDROCODONE-ACETAMINOPHEN 10-325 MG PO TABS
1.0000 | ORAL_TABLET | ORAL | Status: DC | PRN
  Administered 2011-07-17 – 2011-07-19 (×3): 2 via ORAL
  Filled 2011-07-17 (×5): qty 2

## 2011-07-17 MED ORDER — QUETIAPINE FUMARATE ER 50 MG PO TB24
100.0000 mg | ORAL_TABLET | Freq: Every day | ORAL | Status: DC
  Administered 2011-07-17 – 2011-07-18 (×2): 100 mg via ORAL
  Filled 2011-07-17 (×3): qty 2
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
## 2011-07-17 NOTE — Progress Notes (Signed)
Doing very well.  Passed swallow study.  CIR evaluation.  She asked about R finger sutures and I D/W Montez Morita, [facility] - those are to come out Friday. Patient examined and I agree with the assessment and plan  [name], MD, MPH, FACS Pager: [phone]  07/17/2011 1:05 PM
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
## 2011-07-17 NOTE — Progress Notes (Signed)
07/17/11 Call to [facility] RN rehab, earlier today, per Genie they will see patient today to assess for possible rehab admission. Jim Like RN CCM [facility]

## 2011-07-17 NOTE — Progress Notes (Signed)
Nutrition Follow-up  Diet Order:  Dysphagia 3, honey-thick  NGT removed, pt completed FEES this am. Pt advanced to Dysphagia 3 diet, honey-thick liquids.  Lunch will be her first meal on this diet.  Pt correctly states that it is important for her to tuck her chin when swallowing for safety.  She is anticipating lunch.  Encouraged pt that consuming sufficient kcal and protein safely is important for continued wound healing. Pt verbalizes understanding.  Meds: Scheduled Meds:   . alteplase  2 mg Intracatheter Once  . alteplase  2 mg Intracatheter Once  . antiseptic oral rinse  15 mL Mouth Rinse QID  . bacitracin   Topical BID  . bisacodyl  10 mg Rectal Daily  . chlorhexidine  15 mL Mouth Rinse BID  . citalopram  30 mg Oral Daily  . docusate sodium  200 mg Oral BID  . enoxaparin (LOVENOX) injection  30 mg Subcutaneous Q12H  . ferrous sulfate  325 mg Oral TID WC  . insulin aspart  0-20 Units Subcutaneous Q4H  . neomycin-bacitracin-polymyxin   Topical Daily  . polyethylene glycol  17 g Per Tube Daily  . QUEtiapine  100 mg Oral QHS  . sodium chloride  10-40 mL Intracatheter Q12H  . DISCONTD:  ceFAZolin (ANCEF) IV  1 g Intravenous Once  . DISCONTD: citalopram  30 mg Per Tube Daily  . DISCONTD: clonazePAM  0.5 mg Per Tube QHS  . DISCONTD: docusate  100 mg Oral BID  . DISCONTD: docusate  100 mg Per Tube BID  . DISCONTD: docusate  200 mg Per Tube BID  . DISCONTD: feeding supplement (PIVOT 1.5 CAL)  1,000 mL Per Tube Q24H  . DISCONTD: ferrous sulfate  300 mg Oral TID WC  . DISCONTD: ferrous sulfate  300 mg Per Tube TID WC  . DISCONTD: metoprolol tartrate  12.5 mg Oral BID  . DISCONTD: pantoprazole sodium  40 mg Per Tube Q1200  . DISCONTD: QUEtiapine  25 mg Per Tube TID   Continuous Infusions:   . dextrose 5 % and 0.45 % NaCl with KCl 20 mEq/L 1,000 mL (07/15/11 1425)   PRN Meds:.acetaminophen, albuterol, diphenhydrAMINE, food thickener, haloperidol lactate, HYDROcodone-acetaminophen,  HYDROmorphone (DILAUDID) injection, ondansetron (ZOFRAN) IV, ondansetron, phenol, sodium chloride, DISCONTD: HYDROcodone-acetaminophen, DISCONTD:  HYDROmorphone (DILAUDID) injection, DISCONTD:  morphine injection  Labs:  CMP     Component Value Date/Time   NA 135 07/15/2011 0649   K 4.5 07/15/2011 0649   CL 101 07/15/2011 0649   CO2 26 07/15/2011 0649   GLUCOSE 122* 07/15/2011 0649   BUN 18 07/15/2011 0649   CREATININE 0.75 07/15/2011 0649   CALCIUM 8.9 07/15/2011 0649   PROT 7.0 07/14/2011 0413   ALBUMIN 2.6* 07/14/2011 0413   AST 22 07/14/2011 0413   ALT 29 07/14/2011 0413   ALKPHOS 180* 07/14/2011 0413   BILITOT 0.5 07/14/2011 0413   GFRNONAA >90 07/15/2011 0649   GFRAA >90 07/15/2011 0649     Intake/Output Summary (Last 24 hours) at 07/17/11 1237 Last data filed at 07/17/11 0500  Gross per 24 hour  Intake    720 ml  Output    550 ml  Net    170 ml    Weight Status:  235 lbs Admission wt: 242 lbs  Restatement of needs: 1800-2000 kcal, 100-115g protein.  Nutrition Dx:  Inadequate oral intake.  Pt resuming PO diet today.  Intervention:   1.  Meal/snacks; encouraged intake at meals.  Reinforced safety strategies presented by SLP.  Informed pt that snacks and supplements can be used to maximize intake if needed.  Monitor:   1.  Enteral nutrition; continued tolerance with pt meeting 100% of needs. No longer appropriate, discontinue.  2. Wt/wt change; trends. Met, currently stable 3.  Food/Beverage; diet advancement with tolerance, pt consuming >50% of meals safely.  Hoyt Koch Pager #:  [phone]

## 2011-07-17 NOTE — Progress Notes (Signed)
Patient ID: Tonya [name], female   DOB: 1985/04/03, 26 y.o.   MRN: [id number]   LOS: 20 days   Subjective: No new c/o. Pain in LLE improved.  Objective: Vital signs in last 24 hours: Temp:  [98.6 F (37 C)-100.4 F (38 C)] 98.6 F (37 C) (06/17 0631) Pulse Rate:  [93-102] 93  (06/17 0631) Resp:  [16-20] 20  (06/17 0631) BP: (113-128)/(60-65) 113/60 mmHg (06/17 0631) SpO2:  [91 %-94 %] 94 % (06/17 0631) Weight:  [106.958 kg (235 lb 12.8 oz)] 106.958 kg (235 lb 12.8 oz) (06/17 0500) Last BM Date: 07/16/11   General appearance: alert and no distress Resp: clear to auscultation bilaterally Cardio: Mild tachycardia GI: normal findings: bowel sounds normal and soft, non-tender   Assessment/Plan: PHBC  R traumatic trans-knee AKA - Closed  L tib fib Fx s/p ORIF -- NWB Left 2nd MC fx -- Splint, WBAT  ?L adrenal hemorrhage  Multiple abrasions - Xeroform and local care  R forearm 5th digit extensor tendon injury s/p repair -- WBAT, splint removed, ACE wrap only  Traumatic hernia R lateral abdominal wall - elective repair  Suicidal/bipolar - Needs IP psych treatment at some point according to psych. Will start seroquel as recommended. ABL anemia - Stable after surgery FEN -- Tolerating TF. Hopeful for repeat swallow eval today.  VTE - Lovenox. Will start coumadin once pt taking PO's. Dispo -- CIR consult.     Freeman Caldron, PA-C Pager: [phone] General Trauma PA Pager: [phone]   07/17/2011
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
## 2011-07-17 NOTE — Clinical Social Work Note (Signed)
Clinical Social Worker spoke with patient and patient fiance at bedside to offer continued support.  Patient would still like to complete Advanced Information for Mental Health document - patient fiance family are present at the hospital and able to witness the document - CSW notarized and provided patient and patient chart with copies.  Patient seems to be coping well and addressing the loss of her right leg appropriately.  Patient with a very positive interaction with fiance and fiance family.  CSW has not seen any of patient immediately family at bedside.  Patient awaiting response from [facility] for further rehab options following hospitalization.  Per Psych MD, patient is not psychiatrically cleared for SNF option at this time.  Clinical Social Worker will continue to follow patient for support and appropriate discharge needs.  [address] [place], [place] [phone]

## 2011-07-17 NOTE — Progress Notes (Signed)
Physical Therapy Treatment Note   07/17/11 1044  PT Visit Information  Last PT Received On 07/17/11  Assistance Needed +3 or more  PT Time Calculation  PT Start Time 1044  PT Stop Time 1125  PT Time Calculation (min) 41 min  Subjective Data  Subjective Pt agreeable to OOB transfer to chair. C/o 4-5/10 L LE pain  Precautions  Precautions Fall  Restrictions  RUE Weight Bearing WBAT  LUE Weight Bearing WBAT  LLE Weight Bearing NWB  Cognition  Overall Cognitive Status Appears within functional limits for tasks assessed/performed  Arousal/Alertness Awake/alert  Orientation Level Appears intact for tasks assessed  Behavior During Session [facility] for tasks performed  Bed Mobility  Supine to Sit 1: +2 Total assist;HOB flat  Supine to Sit: Patient Percentage 40%  Sitting - Scoot to Edge of Bed 1: +2 Total assist  Sitting - Scoot to Edge of Bed: Patient Percentage 40%  Details for Bed Mobility Assistance max directional v/c's, assist for trunk elevation and L LE management  Transfers  Transfers Lateral/Scoot Transfers  Lateral/Scoot Transfers 1: +2 Total assist;With slide board  Lateral Transfers: Patient Percentage 10%  Details for Transfer Assistance maxAx3 with slide board, 1 person to manage L LE, 2 person to slide pt over despite max encouragement to partipate as much as possible  Exercises  Exercises (hand out provided)  Total Joint Exercises  Ankle Circles/Pumps AROM;Left;10 reps;Seated  Quad Sets PROM;Left;10 reps;Seated (with 5 sec hold)  Gluteal Sets AROM;Both;10 reps;Seated  PT - End of Session  Activity Tolerance Patient tolerated treatment well  Patient left in bed;with call bell/phone within reach  Nurse Communication Mobility status  PT - Assessment/Plan  Comments on Treatment Session Pt tolerated OOB transfer to chair. Pt with lightheadedness upon initial sitting up at EOB.  PT Plan Discharge plan remains appropriate  PT Frequency Min 5X/week  Recommendations for  Other Services Rehab consult  Follow Up Recommendations Inpatient Rehab  Equipment Recommended Defer to next venue  Acute Rehab PT Goals  PT Goal Formulation With patient  Time For Goal Achievement 07/25/11  PT Goal: Supine/Side to Sit - Progress Progressing toward goal  PT Goal: Sit at [facility] Of Bed - Progress Progressing toward goal  PT Goal: Sit to Supine/Side - Progress Progressing toward goal  PT Transfer Goal: Bed to Chair/Chair to Bed - Progress Progressing toward goal  PT General Charges  $$ ACUTE PT VISIT 1 Procedure  PT Treatments  $Therapeutic Exercise 8-22 mins  $Therapeutic Activity 23-37 mins    Instructed RN tech on how to assist pt back to bed from drop arm recliner using slide board and 2-3 people for safe technique.  Pt tolerated minimal passive L knee flexion to 10-15 degrees x 10 reps.   Lewis Shock, PT, DPT Pager #: [phone] Office #: [phone]
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
## 2011-07-18 DIAGNOSIS — F4310 Post-traumatic stress disorder, unspecified: Secondary | ICD-10-CM

## 2011-07-18 DIAGNOSIS — X818XXA Intentional self-harm by jumping or lying in front of other moving object, initial encounter: Secondary | ICD-10-CM

## 2011-07-18 LAB — PROTIME-INR: INR: 1.1 (ref 0.00–1.49)

## 2011-07-18 MED ORDER — HYDROMORPHONE HCL PF 1 MG/ML IJ SOLN
0.5000 mg | INTRAMUSCULAR | Status: DC | PRN
  Administered 2011-07-18: 0.5 mg via INTRAVENOUS
  Filled 2011-07-18: qty 1

## 2011-07-18 MED ORDER — WARFARIN - PHARMACIST DOSING INPATIENT: Freq: Every day | Status: DC

## 2011-07-18 MED ORDER — WARFARIN VIDEO
Freq: Once | Status: AC
  Administered 2011-07-18: 18:00:00

## 2011-07-18 MED ORDER — ACETAMINOPHEN 160 MG/5ML PO SUSP
650.0000 mg | ORAL | Status: DC | PRN
  Filled 2011-07-18: qty 20.3

## 2011-07-18 MED ORDER — ACETAMINOPHEN 160 MG/5ML PO SUSP: 650.0000 mg | ORAL | Status: DC | PRN

## 2011-07-18 MED ORDER — BISACODYL 10 MG RE SUPP: 10.0000 mg | Freq: Every day | RECTAL | Status: DC | PRN

## 2011-07-18 MED ORDER — PATIENT'S GUIDE TO USING COUMADIN BOOK
Freq: Once | Status: AC
  Administered 2011-07-18: 18:00:00
  Filled 2011-07-18: qty 1

## 2011-07-18 MED ORDER — DULOXETINE HCL 30 MG PO CPEP
30.0000 mg | ORAL_CAPSULE | Freq: Every day | ORAL | Status: DC
  Administered 2011-07-18 – 2011-07-19 (×2): 30 mg via ORAL
  Filled 2011-07-18 (×2): qty 1

## 2011-07-18 MED ORDER — WARFARIN SODIUM 7.5 MG PO TABS
7.5000 mg | ORAL_TABLET | Freq: Once | ORAL | Status: AC
  Administered 2011-07-18: 7.5 mg via ORAL
  Filled 2011-07-18: qty 1
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
## 2011-07-18 NOTE — Progress Notes (Signed)
Patient improving medically.  She is speaking with the CIR coordinator this PM. [name], MD, MPH, FACS Pager: [phone]

## 2011-07-18 NOTE — Progress Notes (Signed)
PT Progress Note:     07/18/11 1400  PT Visit Information  Last PT Received On 07/18/11  Assistance Needed +2  PT/OT Co-Evaluation/Treatment Yes  PT Time Calculation  PT Start Time 1011  PT Stop Time 1049  PT Time Calculation (min) 38 min  Precautions  Precautions Fall  Other Brace/Splint L hand splint  Restrictions  Weight Bearing Restrictions Yes  RUE Weight Bearing WBAT  LUE Weight Bearing WBAT  LLE Weight Bearing NWB  Cognition  Overall Cognitive Status Appears within functional limits for tasks assessed/performed  Arousal/Alertness Awake/alert  Orientation Level Appears intact for tasks assessed  Behavior During Session [facility] for tasks performed  Bed Mobility  Bed Mobility Supine to Sit;Rolling Left  Rolling Left 2: Max assist;With rail  Rolling Left: Patient Percentage 30%  Supine to Sit With rails;HOB elevated;2: Max assist (HOB elevated ~30 degrees)  Supine to Sit: Patient Percentage 40%  Details for Bed Mobility Assistance Pt rolling to Lt with use of rails & towel loop.  Performed supine>long sitting 2x's with use of rails.  Pt required +2 total (A) to pivot hips to prepare for posterior transfer to recliner.  Max directional cues for technique & max encouragement to increase active participation.    Transfers  Transfers Warden/ranger 1: +2 Total assist;To level surface  Anterior-Posterior Transfers: Patient Percentage 20%  Details for Transfer Assistance Max directional cues for technique.  use of draw pad to scoot hips backwards from bed>recliner.  Pt able to maintain sittiing upright without physical assist during transfer but unable shift hips backwards.    Ambulation/Gait  Ambulation/Gait Assistance Not tested (comment)  Wheelchair Mobility  Wheelchair Mobility No  Balance  Balance Assessed Yes  Static Sitting Balance  Static Sitting - Balance Support Bilateral upper extremity supported  Static Sitting - Level of  Assistance 3: Mod assist;5: Stand by assistance  Static Sitting - Comment/# of Minutes Pt able to maintain long sitting position in bed fluctuating between mod to min guard assist.    Exercises  Exercises General Lower Extremity  Total Joint Exercises  Ankle Circles/Pumps AROM;Left;10 reps  Quad Sets AROM;Left;10 reps  Heel Slides AAROM;Left;10 reps  Hip ABduction/ADduction AAROM;Left;10 reps  PT - End of Session  Activity Tolerance Patient tolerated treatment well  Patient left in chair;with call bell/phone within reach  Nurse Communication Mobility status  PT - Assessment/Plan  Comments on Treatment Session Per hilary ward, OT, pt with increased participation this session compared to last PT/OT session.  Pt does cont to require max encouragement throughout.     PT Plan Discharge plan remains appropriate  PT Frequency Min 5X/week  Recommendations for Other Services Rehab consult  Follow Up Recommendations Inpatient Rehab  Equipment Recommended Defer to next venue  Acute Rehab PT Goals  Time For Goal Achievement 07/25/11  Potential to Achieve Goals Good  PT Goal: Supine/Side to Sit - Progress Progressing toward goal  PT Transfer Goal: Bed to Chair/Chair to Bed - Progress Progressing toward goal  PT General Charges  $$ ACUTE PT VISIT 1 Procedure  PT Treatments  $Therapeutic Activity 38-52 mins     Verdell Face, [place] [phone] 07/18/2011

## 2011-07-18 NOTE — Progress Notes (Signed)
Tonya Bianchini, MD, MPH, FACS Pager: [phone]  

## 2011-07-18 NOTE — Progress Notes (Signed)
I spoke with Dr. Ferol Luz who recommended changing celexa to cymbalta and discontinuing her sitter. Both done.  Freeman Caldron, PA-C Pager: [phone] General Trauma PA Pager: [phone]

## 2011-07-18 NOTE — Progress Notes (Signed)
Speech Language Pathology Dysphagia Treatment Patient Details Name: Tonya [name] MRN: [id number] DOB: 02-05-1985 Today's Date: 07/18/2011 Time: 7829-5621 SLP Time Calculation (min): 20 min  Assessment / Plan / Recommendation Clinical Impression  F/u after yesterday's FEES.  Pt utilizing chin tuck for airway protection with honey-thick liquids.  Phonation is hoarse; each liquid sip elicits consistent, weak throat-clearing, likely indicative of penetration according to FEES.   Pt concerned about material entering airway; encouraged her that her sensation is improving and throat-clear is protective, acting to eject penetrate from larynx.  Reviewed results/recs; pt with good awareness of dysphagia and asked appropriate questions.    Diet Recommendation  Continue with Current Diet: Dysphagia 3 (mechanical soft);Honey-thick liquid (with chin tuck)    SLP Plan Continue toward goals      Swallowing Goals  SLP Swallowing Goals Patient will consume recommended diet without observed clinical signs of aspiration with: Independent assistance Swallow Study Goal #1 - Progress: Progressing toward goal Patient will utilize recommended strategies during swallow to increase swallowing safety with: Modified independent assistance Swallow Study Goal #2 - Progress: Progressing toward goal Goal #3: Pt. will demonstrate decreased signs of aspirating her secretions via decreased coughing episodes and increased phonation to recommend appropriateness for repeat objective swallow assessment. Swallow Study Goal #3 - Progress: Met  General Temperature Spikes Noted: No Respiratory Status: Room air Behavior/Cognition: Alert;Cooperative Oral Cavity - Dentition: Adequate natural dentition Patient Positioning: Upright in chair  Oral Cavity - Oral Hygiene Does patient have any of the following "at risk" factors?: Other - dysphagia Brush patient's teeth BID with toothbrush (using toothpaste with fluoride): Yes     Dysphagia Treatment Treatment focused on: Patient/family/caregiver education;Skilled observation of diet tolerance Treatment Methods/Modalities: Skilled observation Patient observed directly with PO's: Yes Type of PO's observed: Regular;Honey-thick liquids Feeding: Able to feed self Liquids provided via: Cup Pharyngeal Phase Signs & Symptoms: Immediate throat clear Type of cueing: Verbal Amount of cueing:  Min  Kion Huntsberry L. Samson Frederic, [place] CCC/SLP Pager [phone]   Blenda Mounts [place] 07/18/2011, 11:36 AM
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
## 2011-07-18 NOTE — Consult Note (Addendum)
Wound care follow-up:  All abrasions to face, chest, thigh, and back pink dry and healing well.  Ortho following for plan of care for wounds to left and right hands and BLE. Will not plan to follow further unless re-consulted.  [address], RN, MSN, [employer]  [phone]

## 2011-07-18 NOTE — Progress Notes (Signed)
Patient ID: Tonya [name], female   DOB: [birth year], 26 y.o.   MRN: [id number]   LOS: 21 days   Subjective: No c/o but lots of questions. Having occasional problems with drinking still.  Objective: Vital signs in last 24 hours: Temp:  [98.1 F (36.7 C)-99.6 F (37.6 C)] 98.6 F (37 C) (06/18 4696) Pulse Rate:  [80-99] 80  (06/18 0642) Resp:  [18-20] 20  (06/18 0642) BP: (113-131)/(61-75) 123/67 mmHg (06/18 0642) SpO2:  [92 %-98 %] 98 % (06/18 0642) Last BM Date: 07/16/11   General appearance: alert and no distress Resp: clear to auscultation bilaterally Cardio: regular rate and rhythm GI: normal findings: bowel sounds normal and soft, non-tender Extremities: NVI   Assessment/Plan: PHBC  R traumatic trans-knee AKA - Closed  L tib fib Fx s/p ORIF -- NWB  Left 2nd MC fx -- Splint, WBAT  ?L adrenal hemorrhage  Multiple abrasions - Xeroform and local care  R forearm 5th digit extensor tendon injury s/p repair -- WBAT, splint removed, ACE wrap only  Traumatic hernia R lateral abdominal wall - elective repair  Suicidal/bipolar - Needs IP psych treatment at some point according to psych.  ABL anemia - Stable. Continue Fe and Aranesp. FEN -- Tolerating D3/honey diet. VTE - Lovenox. Will start coumadin. Dispo -- CIR?    Freeman Caldron, PA-C Pager: [phone] General Trauma PA Pager: [phone]   07/18/2011

## 2011-07-18 NOTE — Progress Notes (Signed)
OT PROGRESS NOTE   07/18/11 1000  OT Visit Information  Last OT Received On 07/18/11  Assistance Needed +2  PT/OT Co-Evaluation/Treatment Yes  OT Time Calculation  OT Start Time 1011  OT Stop Time 1049  OT Time Calculation (min) 38 min  Precautions  Precautions Fall  Other Brace/Splint L footdrop splint for bed/only - positional  Restrictions  Weight Bearing Restrictions Yes  RUE Weight Bearing WBAT  LUE Weight Bearing WBAT  LLE Weight Bearing NWB  ADL  Grooming Performed;Teeth care;Set up  Where Assessed - Grooming Supine, head of bed up  Transfers/Ambulation Related to ADLs total A +2 ant/post transfer with focus on facilitation of lateral weight shifts. Pt has difficulty with transfers secondary to obesity , poor core strength and nonuse L hand due to splint. Will contact ortho regarding L hand splint and posible modifications to incrase funcitonal mobility.  ADL Comments pt brushed teeth with set up. Discussed her level of participation with ADL amd ex  Bed Mobility  Bed Mobility Supine to Sit;Sitting - Scoot to Edge of Bed  Rolling Left 2: Max assist  Rolling Left: Patient Percentage 30%  Supine to Sit HOB elevated;With rails (long sit - to supine)  Supine to Sit: Patient Percentage 40%  Sitting - Scoot to Edge of Bed 1: +2 Total assist  Sitting - Scoot to Edge of Bed: Patient Percentage 40%  Details for Bed Mobility Assistance paritcipated in ant/post transfer with increased participation today as compared to yesterday. Pt staed that this transfer was easier for her than using a lside board. Pt initiating more movement today.  Transfers  Details for Transfer Assistance ant/post +2 total A  Static Sitting Balance  Static Sitting - Balance Support Right upper extremity supported (long sitting with occ min guard for @ in bed. required )  Static Sitting - Comment/# of Minutes 5 minutes  OT - End of Session  Activity Tolerance Patient tolerated treatment well  Patient  left in chair;with call bell/phone within reach;with nursing in room  Nurse Communication Mobility status;Weight bearing status (pt WBAT L hand. OK to D/C splint er Orlan Leavens)  OT Assessment/Plan  Comments on Treatment Session PT demonstrating increased participation today. discussed need for her to complete as much of ADL as possible at bed level and to sit in chair and begin working on bed mobility. Used L arm loop to assist with bed mobility. Talked with Dr. Orlan Leavens and recieved vo to remove splint L hand, which will enable greater functional use of L hand with transfers.  OT Plan Discharge plan remains appropriate  OT Frequency Min 2X/week  Recommendations for Other Services Rehab consult  Follow Up Recommendations Inpatient Rehab  Equipment Recommended Defer to next venue  Acute Rehab OT Goals  OT Goal Formulation With patient  Time For Goal Achievement 07/24/11  Potential to Achieve Goals Good  ADL Goals  Pt Will Perform Grooming with min assist;Supported;Sitting, chair  ADL Goal: Grooming - Progress Progressing toward goals  Pt Will Perform Upper Body Bathing with min assist;Supine, head of bed up;Supported  ADL Goal: Upper Body Bathing - Progress Progressing toward goals  Pt Will Perform Lower Body Bathing with mod assist;Supported;Supine, rolling right and/or left  ADL Goal: Lower Body Bathing - Progress Progressing toward goals  Pt Will Transfer to Toilet with 1+ total assist;Anterior-posterior transfer;Extra wide 3-in-1  ADL Goal: Toilet Transfer - Progress Progressing toward goals  Arm Goals  Pt Will Complete Theraband Exer with minimal assist;to increase strength;Bilateral upper  extremities;2 sets;Level 1 Theraband;10 reps  Arm Goal: Theraband Exercises - Progress Progressing toward goal  OT General Charges  $OT Visit 1 Procedure  OT Treatments  $Self Care/Home Management  8-22 mins  $Therapeutic Activity 23-37 mins     [facility], OTR/L  [phone] 07/18/2011

## 2011-07-18 NOTE — Progress Notes (Signed)
Rehab admissions - Evaluated for possible admission.  I spoke with patient.  She plans to live with her fiance/boyfriend after discharge.  They have been staying with her boyfriend's cousin since coming from [place] to [place].  I will call the boyfriend to discuss rehab options.  Patient is open to going to outpatient psych program after her discharge.   I hope to confirm discharge plan and be able to get patient into inpatient rehab tomorrow.  Call me for questions.  #[phone]

## 2011-07-19 ENCOUNTER — Inpatient Hospital Stay (HOSPITAL_COMMUNITY)
Admission: RE | Admit: 2011-07-19 | Discharge: 2011-08-18 | DRG: 945 | Disposition: A | Discharge status: Skilled Nursing Facility | Payer: Medicare Other | Source: Ambulatory Visit | Attending: Physical Medicine & Rehabilitation | Admitting: Physical Medicine & Rehabilitation

## 2011-07-19 DIAGNOSIS — T1491XA Suicide attempt, initial encounter: Secondary | ICD-10-CM

## 2011-07-19 DIAGNOSIS — S78119A Complete traumatic amputation at level between unspecified hip and knee, initial encounter: Secondary | ICD-10-CM

## 2011-07-19 DIAGNOSIS — G547 Phantom limb syndrome without pain: Secondary | ICD-10-CM | POA: Diagnosis present

## 2011-07-19 DIAGNOSIS — F319 Bipolar disorder, unspecified: Secondary | ICD-10-CM | POA: Diagnosis present

## 2011-07-19 DIAGNOSIS — X828XXA Other intentional self-harm by crashing of motor vehicle, initial encounter: Secondary | ICD-10-CM | POA: Diagnosis present

## 2011-07-19 DIAGNOSIS — R1310 Dysphagia, unspecified: Secondary | ICD-10-CM | POA: Diagnosis present

## 2011-07-19 DIAGNOSIS — S82409A Unspecified fracture of shaft of unspecified fibula, initial encounter for closed fracture: Secondary | ICD-10-CM

## 2011-07-19 DIAGNOSIS — IMO0002 Reserved for concepts with insufficient information to code with codable children: Secondary | ICD-10-CM

## 2011-07-19 DIAGNOSIS — T1490XA Injury, unspecified, initial encounter: Secondary | ICD-10-CM

## 2011-07-19 DIAGNOSIS — E669 Obesity, unspecified: Secondary | ICD-10-CM | POA: Diagnosis present

## 2011-07-19 DIAGNOSIS — T07XXXA Unspecified multiple injuries, initial encounter: Secondary | ICD-10-CM

## 2011-07-19 DIAGNOSIS — S82209A Unspecified fracture of shaft of unspecified tibia, initial encounter for closed fracture: Secondary | ICD-10-CM | POA: Diagnosis present

## 2011-07-19 DIAGNOSIS — S82209B Unspecified fracture of shaft of unspecified tibia, initial encounter for open fracture type I or II: Secondary | ICD-10-CM

## 2011-07-19 DIAGNOSIS — N390 Urinary tract infection, site not specified: Secondary | ICD-10-CM | POA: Diagnosis not present

## 2011-07-19 DIAGNOSIS — S62309A Unspecified fracture of unspecified metacarpal bone, initial encounter for closed fracture: Secondary | ICD-10-CM | POA: Diagnosis present

## 2011-07-19 DIAGNOSIS — S82409B Unspecified fracture of shaft of unspecified fibula, initial encounter for open fracture type I or II: Secondary | ICD-10-CM

## 2011-07-19 DIAGNOSIS — Z5189 Encounter for other specified aftercare: Principal | ICD-10-CM

## 2011-07-19 LAB — VITAMIN D 1,25 DIHYDROXY: Vitamin D3 1, 25 (OH)2: 50 pg/mL

## 2011-07-19 LAB — PROTIME-INR
INR: 1.13 (ref 0.00–1.49)
Prothrombin Time: 14.7 s (ref 11.6–15.2)

## 2011-07-19 MED ORDER — BIOTENE DRY MOUTH MT LIQD
15.0000 mL | Freq: Four times a day (QID) | OROMUCOSAL | Status: DC
  Administered 2011-07-20 – 2011-08-12 (×30): 15 mL via OROMUCOSAL

## 2011-07-19 MED ORDER — FERROUS SULFATE 325 (65 FE) MG PO TABS
325.0000 mg | ORAL_TABLET | Freq: Three times a day (TID) | ORAL | Status: DC
  Administered 2011-07-20 – 2011-08-18 (×86): 325 mg via ORAL
  Filled 2011-07-19 (×92): qty 1

## 2011-07-19 MED ORDER — POLYETHYLENE GLYCOL 3350 17 G PO PACK
17.0000 g | PACK | Freq: Every day | ORAL | Status: DC | PRN
  Administered 2011-07-23: 17 g via ORAL
  Filled 2011-07-19: qty 1

## 2011-07-19 MED ORDER — ALBUTEROL SULFATE (5 MG/ML) 0.5% IN NEBU: 2.5000 mg | INHALATION_SOLUTION | Freq: Four times a day (QID) | RESPIRATORY_TRACT | Status: DC | PRN

## 2011-07-19 MED ORDER — HYDROCODONE-ACETAMINOPHEN 10-325 MG PO TABS
1.0000 | ORAL_TABLET | ORAL | Status: DC | PRN
  Administered 2011-07-20 (×5): 2 via ORAL
  Filled 2011-07-19 (×5): qty 2

## 2011-07-19 MED ORDER — WARFARIN SODIUM 7.5 MG PO TABS
7.5000 mg | ORAL_TABLET | Freq: Once | ORAL | Status: DC
  Filled 2011-07-19: qty 1

## 2011-07-19 MED ORDER — SORBITOL 70 % SOLN: 30.0000 mL | Freq: Every day | Status: DC | PRN

## 2011-07-19 MED ORDER — DULOXETINE HCL 30 MG PO CPEP
30.0000 mg | ORAL_CAPSULE | Freq: Every day | ORAL | Status: DC
  Administered 2011-07-20 – 2011-07-24 (×5): 30 mg via ORAL
  Filled 2011-07-19 (×6): qty 1

## 2011-07-19 MED ORDER — ONDANSETRON HCL 4 MG PO TABS: 4.0000 mg | ORAL_TABLET | Freq: Four times a day (QID) | ORAL | Status: DC | PRN

## 2011-07-19 MED ORDER — WARFARIN SODIUM 7.5 MG PO TABS
7.5000 mg | ORAL_TABLET | Freq: Once | ORAL | Status: AC
  Administered 2011-07-19: 7.5 mg via ORAL
  Filled 2011-07-19: qty 1

## 2011-07-19 MED ORDER — ONDANSETRON HCL 4 MG/2ML IJ SOLN: 4.0000 mg | Freq: Four times a day (QID) | INTRAMUSCULAR | Status: DC | PRN

## 2011-07-19 MED ORDER — ACETAMINOPHEN 325 MG PO TABS
325.0000 mg | ORAL_TABLET | ORAL | Status: DC | PRN
  Administered 2011-07-23 – 2011-08-12 (×3): 650 mg via ORAL
  Filled 2011-07-19 (×3): qty 2

## 2011-07-19 MED ORDER — WARFARIN - PHARMACIST DOSING INPATIENT
Freq: Every day | Status: DC
  Administered 2011-07-23 – 2011-08-17 (×3)

## 2011-07-19 MED ORDER — STARCH (THICKENING) PO POWD
ORAL | Status: DC | PRN
  Filled 2011-07-19: qty 227

## 2011-07-19 MED ORDER — QUETIAPINE FUMARATE ER 50 MG PO TB24
100.0000 mg | ORAL_TABLET | Freq: Every day | ORAL | Status: DC
  Administered 2011-07-19 – 2011-07-20 (×2): 100 mg via ORAL
  Filled 2011-07-19 (×3): qty 2

## 2011-07-19 MED ORDER — BACITRACIN ZINC 500 UNIT/GM EX OINT
1.0000 "application " | TOPICAL_OINTMENT | Freq: Two times a day (BID) | CUTANEOUS | Status: DC
  Administered 2011-07-19 – 2011-08-18 (×52): 1 via TOPICAL
  Filled 2011-07-19 (×7): qty 15

## 2011-07-19 MED ORDER — METHOCARBAMOL 500 MG PO TABS
500.0000 mg | ORAL_TABLET | Freq: Four times a day (QID) | ORAL | Status: DC | PRN
  Administered 2011-07-20: 500 mg via ORAL
  Filled 2011-07-19: qty 1

## 2011-07-19 MED ORDER — ENOXAPARIN SODIUM 30 MG/0.3ML ~~LOC~~ SOLN
30.0000 mg | Freq: Two times a day (BID) | SUBCUTANEOUS | Status: DC
  Administered 2011-07-19 – 2011-07-24 (×11): 30 mg via SUBCUTANEOUS
  Filled 2011-07-19 (×13): qty 0.3
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
## 2011-07-19 NOTE — Clinical Social Work Note (Signed)
Clinical Social Worker following patient for emotional support and discharge planning needs.  Patient is in very good spirits today while CSW was visiting at bedside.  Patient expressed how grateful she was to be able to spend time with her fiance in the room without the presence of a sitter.  Patient and patient fiance have worked diligently to have all paperwork lined up and appropriate housing arrangements for patient at discharge.  Patient and patient fiance have completed Living Will document, Healthcare Power of Attorney, and Advanced Instruction for Mental Health Treatment with CSW and have hired someone to come in and complete Engineer, agricultural paperwork.    Currently, patient fiance is living with his family, but is in the process of finding a handicap accessible apartment for both him and the patient once discharged.  Patient enthusiastically states her excitement for finally living away from her family and "getting a fresh start" here in [place].  Patient is very knowledgeable for the system and proactive in her own heatlhcare decisions.  Patient is very clear in stating that she has a toxic relationship with her mother and does well without her presence.  Patient with appropriate insight when experiencing triggers surrounding her mental health diagnosis.  Patient with good judgement for her future physical and emotional rehab needs.  Patient expressed her appreciation for CSW support and involvement during her hospitalization.  Patient plans to transfer to [facility] today (07/19/2011) and then will discharge home with fiance once CIR stay is complete.  CSW has briefed Inpatient Rehab social workers on patient current status and plans following rehab per patient request.  Clinical Social Worker will sign off for now as social work intervention is no longer needed. Please consult [place] again if new need arises.  [address] [place], [place] [phone]

## 2011-07-19 NOTE — Progress Notes (Signed)
Patient ID: Shandie Chelsea Primus, female   DOB: 1985-10-13, 26 y.o.   MRN: [id number]   LOS: 22 days   Subjective: No complaints. Tolerating diet well. Voice continuing to get stronger. Wants IV out. Feels she may be able to manage pain without IV dilaudid. She is happy to be able to bath herself for the first time.  Objective: Vital signs in last 24 hours: Temp:  [98 F (36.7 C)-99.2 F (37.3 C)] 98.5 F (36.9 C) (06/19 0624) Pulse Rate:  [80-94] 80  (06/19 0624) Resp:  [16-18] 18  (06/19 0624) BP: (115-123)/(64-78) 123/64 mmHg (06/19 0624) SpO2:  [95 %-96 %] 96 % (06/19 0624) Last BM Date: 07/18/11   General appearance: alert and no distress Resp: clear to auscultation bilaterally Cardio: regular rate and rhythm GI: normal findings: bowel sounds normal and soft, non-tender Extremities: NVI   Assessment/Plan: PHBC  R traumatic trans-knee AKA - Closed  L tib fib Fx s/p ORIF -- NWB  Left 2nd MC fx -- splint removed, WBAT  ?L adrenal hemorrhage  Multiple abrasions - Xeroform and local care  R forearm 5th digit extensor tendon injury s/p repair -- WBAT, ACE wrap Traumatic hernia R lateral abdominal wall - elective repair  Suicidal/bipolar - Needs IP psych treatment at some point according to psych. Sitter discontinued. ABL anemia - Stable. Continue Fe. FEN -- Tolerating D3/honey diet. VTE - Lovenox and Coumadin. INR 1.13 today 06:05. Dispo -- Plan to d/c to rehab today.    Glenda Chroman PA-S 07/19/2011 10:05 AM
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
## 2011-07-19 NOTE — Progress Notes (Signed)
Patient came from orthopedic unit and is admitted to Inpatient Rehab at 1900 with fiance at bedside.  Report received from [facility], [place].  Patient is alert and oriented, call bell within reach, bed alarm in place.

## 2011-07-19 NOTE — Progress Notes (Signed)
Speech Language Pathology Dysphagia Treatment Patient Details Name: Tonya [name] MRN: [id number] DOB: 09-Feb-1985 Today's Date: 07/19/2011 Time: 4098-1191 SLP Time Calculation (min): 18 min  Assessment / Plan / Recommendation Clinical Impression  Pt just transferred to bed from bedside commode with PT/OT. Feeling anxious regarding aspiraiton. Phonation again improved though still mildly hoarse. With trials of nectar thick liquids pt with wet vocal quality and immediate throat clear - sensed penetration. SLP questioned pt making sure throat clear was not volitional. With honey thick liquids pt with no wet vocal quality or throat clear. Suspect laryngeal edema slowly improving. Pt to transfer to CIR today. Likely to benefit from repeat FEES friday/monday depending on progress.     Diet Recommendation  Continue with Current Diet: Dysphagia 3 (mechanical soft);Honey-thick liquid    SLP Plan Continue with current plan of care   Pertinent Vitals/Pain NA   Swallowing Goals  SLP Swallowing Goals Patient will consume recommended diet without observed clinical signs of aspiration with: Independent assistance Swallow Study Goal #1 - Progress: Progressing toward goal Patient will utilize recommended strategies during swallow to increase swallowing safety with: Modified independent assistance Swallow Study Goal #2 - Progress: Progressing toward goal Goal #3: Pt. will demonstrate decreased signs of aspirating her secretions via decreased coughing episodes and increased phonation to recommend appropriateness for repeat objective swallow assessment. Swallow Study Goal #3 - Progress: Progressing toward goal  General Temperature Spikes Noted: No Respiratory Status: Room air Behavior/Cognition: Alert;Cooperative Oral Cavity - Dentition: Adequate natural dentition Patient Positioning: Upright in bed  Oral Cavity - Oral Hygiene Does patient have any of the following "at risk" factors?: Diet -  patient on thickened liquids;Other - dysphagia Brush patient's teeth BID with toothbrush (using toothpaste with fluoride): Yes Patient is AT RISK - Oral Care Protocol followed (see row info): Yes   Dysphagia Treatment Treatment focused on: Skilled observation of diet tolerance;Upgraded PO texture trials;Utilization of compensatory strategies;Patient/family/caregiver education Treatment Methods/Modalities: Skilled observation Patient observed directly with PO's: Yes Type of PO's observed: Nectar-thick liquids;Honey-thick liquids Liquids provided via: Cup Pharyngeal Phase Signs & Symptoms: Immediate throat clear;Wet vocal quality   Tonya [name], Riley Nearing 07/19/2011, 11:12 AM

## 2011-07-19 NOTE — Progress Notes (Signed)
Absolutely okay for the patient to go to Rehab today.  This patient has been seen and I agree with the findings and treatment plan.  Marta Lamas. Gae Bon, MD, FACS [phone] (pager) [phone] (direct pager) Trauma Surgeon
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
## 2011-07-19 NOTE — Progress Notes (Signed)
Physical Therapy Treatment Patient Details Name: Tonya [name] MRN: [id number] DOB: [birth year] Today's Date: 07/19/2011 Time: 5784-6962 PT Time Calculation (min): 50 min  PT Assessment / Plan / Recommendation Comments on Treatment Session  Pt continues to require strong encouragement/motivation throughout but there was increased participation noted.   Pt giving herself a bed bath upon arrival today.      Follow Up Recommendations  Inpatient Rehab    Barriers to Discharge        Equipment Recommendations  Defer to next venue    Recommendations for Other Services Rehab consult  Frequency Min 5X/week   Plan Discharge plan remains appropriate    Precautions / Restrictions Precautions Precautions: Fall Other Brace/Splint: D/C hand splint 6/18 per Dr. Orlan Leavens Restrictions Weight Bearing Restrictions: Yes RUE Weight Bearing: Weight bearing as tolerated LUE Weight Bearing: Weight bearing as tolerated LLE Weight Bearing: Non weight bearing       Mobility  Bed Mobility Rolling Right: 1: +2 Total assist;With rail Rolling Right: Patient Percentage: 40% Rolling Left: 2: Max assist;With rail Rolling Left: Patient Percentage: 30% Supine to Sit: 3: Mod assist;With rails;HOB elevated Supine to Sit: Patient Percentage: 50% Sitting - Scoot to Edge of Bed: 1: +2 Total assist Sitting - Scoot to Edge of Bed: Patient Percentage: 40% Sit to Supine: 1: +2 Total assist Sit to Supine: Patient Percentage: 20% Scooting to HOB: 1: +2 Total assist Scooting to [facility]: Patient Percentage: 40% Details for Bed Mobility Assistance: Cont's to require max directional cueing.  Pt very reluctant to use Lt hand.   Improved participation but still requires strong encouragement/motivation throughout.   Transfers Transfers: Risk manager: 1: +2 Total assist;To level surface Anterior-Posterior Transfers: Patient Percentage: 40% Details for Transfer Assistance: Max  directional cues for technique.  use of draw pad to scoot hips forwards & backwards.  Required +2 Total (A) pt= 40% for posterior transfer & +2 Total (A) pt =25% for anterior transfer.   Ambulation/Gait Ambulation/Gait Assistance: Not tested (comment) Stairs: No Wheelchair Mobility Wheelchair Mobility: No    Exercises     PT Diagnosis:    PT Problem List:   PT Treatment Interventions:     PT Goals Acute Rehab PT Goals Time For Goal Achievement: 07/25/11 Potential to Achieve Goals: Good PT Goal: Supine/Side to Sit - Progress: Progressing toward goal PT Goal: Sit to Supine/Side - Progress: Progressing toward goal PT Transfer Goal: Bed to Chair/Chair to Bed - Progress: Progressing toward goal  Visit Information  Last PT Received On: 07/19/11 Assistance Needed: +2 (+3 for anterior transfer back to bed) PT/OT Co-Evaluation/Treatment: Yes    Subjective Data      Cognition  Overall Cognitive Status: Appears within functional limits for tasks assessed/performed Arousal/Alertness: Awake/alert Orientation Level: Appears intact for tasks assessed Behavior During Session: [facility] for tasks performed Cognition - Other Comments: Requires max encouragement. somewhat self limiting. requires mod cues for functional basic problem solving.    Balance     End of Session PT - End of Session Activity Tolerance: Patient tolerated treatment well;Patient limited by fatigue Patient left: in bed;with call bell/phone within reach    Lara Mulch 07/19/2011, 3:27 PM   Verdell Face, PTA [phone] 07/19/2011
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
## 2011-07-20 DIAGNOSIS — T1490XA Injury, unspecified, initial encounter: Secondary | ICD-10-CM

## 2011-07-20 LAB — DIFFERENTIAL
Eosinophils Absolute: 0.3 10*3/uL (ref 0.0–0.7)
Eosinophils Relative: 4 % (ref 0–5)
Lymphocytes Relative: 30 % (ref 12–46)
Lymphs Abs: 2.4 10*3/uL (ref 0.7–4.0)
Monocytes Relative: 13 % — ABNORMAL HIGH (ref 3–12)
Neutrophils Relative %: 54 % (ref 43–77)

## 2011-07-20 LAB — CBC
Hemoglobin: 11.3 g/dL — ABNORMAL LOW (ref 12.0–15.0)
MCH: 29.5 pg (ref 26.0–34.0)
MCHC: 31.6 g/dL (ref 30.0–36.0)
RDW: 15.6 % — ABNORMAL HIGH (ref 11.5–15.5)

## 2011-07-20 LAB — COMPREHENSIVE METABOLIC PANEL
ALT: 12 U/L (ref 0–35)
Albumin: 3.2 g/dL — ABNORMAL LOW (ref 3.5–5.2)
Alkaline Phosphatase: 173 U/L — ABNORMAL HIGH (ref 39–117)
Calcium: 9.9 mg/dL (ref 8.4–10.5)
GFR calc Af Amer: >90 mL/min (ref >90)
Glucose, Bld: 87 mg/dL (ref 70–99)
Potassium: 3.5 mEq/L (ref 3.5–5.1)
Sodium: 141 mEq/L (ref 135–145)
Total Protein: 8.1 g/dL (ref 6.0–8.3)

## 2011-07-20 LAB — PROTIME-INR
INR: 1.21 (ref 0.00–1.49)
Prothrombin Time: 15.6 seconds — ABNORMAL HIGH (ref 11.6–15.2)

## 2011-07-20 LAB — GLUCOSE, CAPILLARY

## 2011-07-20 MED ORDER — ADULT MULTIVITAMIN W/MINERALS CH
1.0000 | ORAL_TABLET | Freq: Every day | ORAL | Status: DC
  Administered 2011-07-20 – 2011-08-18 (×30): 1 via ORAL
  Filled 2011-07-20 (×32): qty 1

## 2011-07-20 MED ORDER — BOOST / RESOURCE BREEZE PO LIQD
1.0000 | ORAL | Status: DC
  Administered 2011-07-20: 1 via ORAL

## 2011-07-20 MED ORDER — ALPRAZOLAM 0.5 MG PO TABS
0.5000 mg | ORAL_TABLET | Freq: Four times a day (QID) | ORAL | Status: DC | PRN
  Administered 2011-07-24: 0.5 mg via ORAL
  Filled 2011-07-20: qty 1

## 2011-07-20 MED ORDER — WARFARIN SODIUM 7.5 MG PO TABS
7.5000 mg | ORAL_TABLET | Freq: Once | ORAL | Status: AC
  Administered 2011-07-20: 7.5 mg via ORAL
  Filled 2011-07-20: qty 1

## 2011-07-20 NOTE — Plan of Care (Signed)
Problem: RH BOWEL ELIMINATION Goal: RH STG MANAGE BOWEL W/EQUIPMENT W/ASSISTANCE STG Manage Bowel With Equipment With Mod I Assistance  Outcome: Not Progressing Patient remains max assist with using bedpan .

## 2011-07-20 NOTE — Plan of Care (Signed)
Problem: RH BOWEL ELIMINATION Goal: RH STG MANAGE BOWEL WITH ASSISTANCE STG Manage Bowel with Mod I Assistance.  Outcome: Not Progressing Last bowel movement 07-16-11 Goal: RH STG MANAGE BOWEL W/MEDICATION W/ASSISTANCE STG Manage Bowel with Medication with Mod I Assistance.  Outcome: Not Progressing Last bowel movement 07-16-11 Goal: RH STG MANAGE BOWEL W/EQUIPMENT W/ASSISTANCE STG Manage Bowel With Equipment With Mod I Assistance  Outcome: Not Progressing Last bowel movement 07-16-11
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
## 2011-07-20 NOTE — Progress Notes (Signed)
Patient information reviewed and entered into [facility] system by Irini Leet, RN, CRRN, PPS Coordinator.  Information including medical coding and functional independence measure will be reviewed and updated through discharge.     Per nursing patient was given "Data Collection Information Summary for Patients in Inpatient Rehabilitation Facilities with attached "Privacy Act Statement-Health Care Records" upon admission.   

## 2011-07-20 NOTE — Care Management (Signed)
Inpatient Rehabilitation Center Individual Statement of Services  Patient Name:  Tonya [name]  Date:  07/20/2011  Welcome to the Inpatient Rehabilitation Center.  Our goal is to provide you with an individualized program based on your diagnosis and situation, designed to meet your specific needs.  With this comprehensive rehabilitation program, you will be expected to participate in at least 3 hours of rehabilitation therapies Monday-Friday, with modified therapy programming on the weekends.  Your rehabilitation program will include the following services:  Physical Therapy (PT), Occupational Therapy (OT), Speech Therapy (ST), 24 hour per day rehabilitation nursing, Therapeutic Recreaction (TR), Case Management (RN and Child psychotherapist), Rehabilitation Medicine, Nutrition Services and Pharmacy Services  Weekly team conferences will be held on  Tuesday  to discuss your progress.  Your RN Case Designer, television/film set will talk with you frequently to get your input and to update you on team discussions.  Team conferences with you and your family in attendance may also be held.  Expected length of stay: 3-4 weeks    Overall anticipated outcome: Min-Mod Assist  Depending on your progress and recovery, your program may change.  Your RN Case Estate agent will coordinate services and will keep you informed of any changes.  Your RN Sports coach and SW names and contact numbers are listed  below.  The following services may also be recommended but are not provided by the Inpatient Rehabilitation Center:   Driving Evaluations  Home Health Rehabiltiation Services  Outpatient Rehabilitatation [facility]  Vocational Rehabilitation   Arrangements will be made to provide these services after discharge if needed.  Arrangements include referral to agencies that provide these services.  Your insurance has been verified to be:  [place] + [place] Your primary doctor is:    Pertinent  information will be shared with your doctor and your insurance company.  Case Manager: Melanee Spry, [facility] [phone]  Social Worker:  [place], [place] [phone]  Information discussed with and copy given to patient by: Brock Ra, 07/20/2011, 1:28 PM
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
## 2011-07-21 LAB — PROTIME-INR
INR: 1.39 (ref 0.00–1.49)
Prothrombin Time: 17.3 seconds — ABNORMAL HIGH (ref 11.6–15.2)

## 2011-07-21 MED ORDER — QUETIAPINE FUMARATE ER 50 MG PO TB24
150.0000 mg | ORAL_TABLET | Freq: Every day | ORAL | Status: DC
  Administered 2011-07-21 – 2011-07-22 (×2): 150 mg via ORAL
  Administered 2011-07-23: 100 mg via ORAL
  Administered 2011-07-24: 150 mg via ORAL
  Filled 2011-07-21 (×5): qty 3

## 2011-07-21 MED ORDER — BOOST / RESOURCE BREEZE PO LIQD
1.0000 | Freq: Two times a day (BID) | ORAL | Status: DC
  Administered 2011-07-22 – 2011-07-27 (×7): 1 via ORAL

## 2011-07-21 MED ORDER — BENZTROPINE MESYLATE 0.5 MG PO TABS
0.5000 mg | ORAL_TABLET | Freq: Two times a day (BID) | ORAL | Status: DC
  Administered 2011-07-24 – 2011-07-29 (×9): 0.5 mg via ORAL
  Filled 2011-07-21 (×20): qty 1

## 2011-07-21 MED ORDER — QUETIAPINE FUMARATE 50 MG PO TABS
50.0000 mg | ORAL_TABLET | Freq: Every morning | ORAL | Status: DC
  Administered 2011-07-21: 50 mg via ORAL
  Filled 2011-07-21: qty 1

## 2011-07-21 MED ORDER — WARFARIN SODIUM 10 MG PO TABS
10.0000 mg | ORAL_TABLET | Freq: Once | ORAL | Status: AC
  Administered 2011-07-21: 10 mg via ORAL
  Filled 2011-07-21: qty 1

## 2011-07-21 MED ORDER — QUETIAPINE FUMARATE ER 50 MG PO TB24: 150.0000 mg | ORAL_TABLET | Freq: Every day | ORAL | Status: DC

## 2011-07-21 MED ORDER — QUETIAPINE FUMARATE ER 50 MG PO TB24
150.0000 mg | ORAL_TABLET | Freq: Every day | ORAL | Status: DC
  Filled 2011-07-21: qty 3

## 2011-07-21 NOTE — Progress Notes (Signed)
Physical Therapy Session Note  Patient Details  Name: Tonya [name] MRN: [id number] Date of Birth: 1985-11-05  Today's Date: 07/21/2011 Time: 1445-1500 (30 min cotx with ST) Time Calculation (min): 15 min  Short Term Goals: Week 1:  PT Short Term Goal 1 (Week 1): Pt will perform static sitting balance EOB x 10 minutes with supervision PT Short Term Goal 2 (Week 1): Pt will perform bed mobility with max A PT Short Term Goal 3 (Week 1): Pt will perform bed <> w/c transfer with +2 pt 65%  Skilled Therapeutic Interventions/Progress Updates:    30 min Cotx with ST who addressed swallowing education and skills in seated position after PT assisted with bed mobility and assisted with sitting balance EOB.  Pt stated nutrition had not come to speak with her today although note in chart states they did have conversation.  Pt less agitated but very giggly and needing encouragement to try things for herself. +2 total assist supine to sit with prolonged time, rails, instruction and HOB up, pt = 10-20%.  Therapy Documentation Precautions:  Precautions Precautions: Fall Restrictions Weight Bearing Restrictions: Yes RUE Weight Bearing: Weight bearing as tolerated LUE Weight Bearing: Weight bearing as tolerated LLE Weight Bearing: Non weight bearing    Pain- c/o pain in LE with movement, unable to rate, requesting LLE be kept extended but when sitting and it was flexed without her noticing she tolerated without complaint    :        Balance: initially + 2 assist d/t pt flailing,rolling head but once settled, mod I seated making grocery list EOB      See FIM for current functional status  Therapy/Group: Co-Treatment  Michaelene Song 07/21/2011, 2:39 PM

## 2011-07-21 NOTE — Progress Notes (Signed)
Physical Therapy Session Note  Patient Details  Name: Tonya [name] MRN: [id number] Date of Birth: 06/29/1985  Today's Date: 07/21/2011 Time: 0800-0915 Time Calculation (min): 75 min  Short Term Goals: Week 1:  PT Short Term Goal 1 (Week 1): Pt will perform static sitting balance EOB x 10 minutes with supervision PT Short Term Goal 2 (Week 1): Pt will perform bed mobility with max A PT Short Term Goal 3 (Week 1): Pt will perform bed <> w/c transfer with +2 pt 65%  Skilled Therapeutic Interventions/Progress Updates:    Significant change in cognition and behavior over night, MD ? meds vs psychosis- fiancee has been present all night and had many questions related to medications so nurse manager was informed and came to speak with him. Pt with difficulty following commands, eyes would go back in head while she was speaking, anxious.  PT and nurse tech assisted pt bed to [name] with slide board , lateral leans to manage pants, +2 assist pt <10 % and resistive at times- took 1 hour. Pt did perform hygeine with instruction while leaning back on PT. Pt initially + 2 for static sitting balance EOB also and took prolonged time to get pt approrpiately seated in preparation for the transfer.  Pt set up for breakfast and PT had concern for her maintaining swalloing precautions- had ST come observe pt for several bites/instruct, and ST stated she was indeed safe to eat breakfast. Pt did not eat much.  Therapy Documentation Precautions:  Precautions Precautions: Fall Restrictions Weight Bearing Restrictions: Yes RUE Weight Bearing: Weight bearing as tolerated LUE Weight Bearing: Weight bearing as tolerated LLE Weight Bearing: Non weight bearing   See FIM for current functional status  Therapy/Group: Individual Therapy  Michaelene Song 07/21/2011, 9:12 AM

## 2011-07-21 NOTE — Progress Notes (Signed)
Speech Language Pathology Daily Session Note  Patient Details  Name: Piya Megen Madewell MRN: [id number] Date of Birth: 1985-08-05  Today's Date: 07/21/2011 Time: 1308-6578 Time Calculation (min): 25 min  Short Term Goals: Week 1: SLP Short Term Goal 1 (Week 1): Pt will utilize swallowing compensatory strategies with Mod I without overt s/s of aspiration SLP Short Term Goal 2 (Week 1): Pt will appropriately thicken liquids with Mod I.   Skilled Therapeutic Interventions: Significant change in cognition and behavior over night, MD ? meds vs psychosis- fiance has been present all night and had many questions related to medications so nurse manager was informed and came to speak with him.  Co-tx with PT to encourage proper positioning for PO intake to increase overall attention and utilization of swallowing compensatory strategies. Pt required Max verbal, visual and tactile cues to utilize chin tuck with honey thick liquids via cup. Pt demonstrated coughing with all trials due to decreased coordination of chin tuck and swallowing initiation.   FIM:  Comprehension Comprehension Mode: Auditory Comprehension: 2-Understands basic 25 - 49% of the time/requires cueing 51 - 75% of the time Expression Expression Mode: Verbal Expression: 2-Expresses basic 25 - 49% of the time/requires cueing 50 - 75% of the time. Uses single words/gestures. Social Interaction Social Interaction: 2-Interacts appropriately 25 - 49% of time - Needs frequent redirection. Problem Solving Problem Solving: 2-Solves basic 25 - 49% of the time - needs direction more than half the time to initiate, plan or complete simple activities Memory Memory: 2-Recognizes or recalls 25 - 49% of the time/requires cueing 51 - 75% of the time FIM - Eating Eating Activity: 5: Supervision/cues  Pain Reports pain with moving  Therapy/Group: Co-Tx with PT  Caroleen Stoermer 07/21/2011, 4:15 PM
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
## 2011-07-21 NOTE — Progress Notes (Signed)
Nutrition Follow-up  RD went to patient's room to obtain nutrition history, as pt was off unit yesterday when RD attempted to speak with her. Pt reports that she likes the [employer] [place] okay and will drink daily. Pt states weight PTA was 250 lb, and consumed Regular diet.  Encouraged pt to consume adequate PO's throughout the day. Reviewed high protein foods. Declined yogurt.  Diet Order:  Dysphagia 3 with Honey Thickened Liquids Supplements: Breeze PO daily and MVI daily  Meds: Scheduled Meds:   . antiseptic oral rinse  15 mL Mouth Rinse QID  . bacitracin  1 application Topical BID  . DULoxetine  30 mg Oral Daily  . enoxaparin (LOVENOX) injection  30 mg Subcutaneous Q12H  . feeding supplement  1 Container Oral Q24H  . ferrous sulfate  325 mg Oral TID WC  . multivitamin with minerals  1 tablet Oral Daily  . QUEtiapine  150 mg Oral QHS  . QUEtiapine  50 mg Oral q morning - 10a  . warfarin  10 mg Oral ONCE-1800  . warfarin  7.5 mg Oral ONCE-1800  . Warfarin - Pharmacist Dosing Inpatient   Does not apply q1800  . DISCONTD: QUEtiapine  100 mg Oral QHS   Continuous Infusions:  PRN Meds:.acetaminophen, albuterol, ALPRAZolam, food thickener, ondansetron (ZOFRAN) IV, ondansetron, polyethylene glycol, sorbitol, DISCONTD: HYDROcodone-acetaminophen, DISCONTD: methocarbamol  Labs:  CMP     Component Value Date/Time   NA 141 07/20/2011 0640   K 3.5 07/20/2011 0640   CL 103 07/20/2011 0640   CO2 25 07/20/2011 0640   GLUCOSE 87 07/20/2011 0640   BUN 15 07/20/2011 0640   CREATININE 0.68 07/20/2011 0640   CALCIUM 9.9 07/20/2011 0640   PROT 8.1 07/20/2011 0640   ALBUMIN 3.2* 07/20/2011 0640   AST 14 07/20/2011 0640   ALT 12 07/20/2011 0640   ALKPHOS 173* 07/20/2011 0640   BILITOT 0.4 07/20/2011 0640   GFRNONAA >90 07/20/2011 0640   GFRAA >90 07/20/2011 0640    No intake or output data in the 24 hours ending 07/21/11 1128  Weight Status:  228 lb - no new wt  Estimated needs:   2100 - 2200 kcal, 100 - 115  grams protein, at least 2 liters daily  Nutrition Dx:  Inadequate oral intake r/t variable appetite and food preferences AEB PO intake of meals <50%.  Goal:  Pt to consume at least 75% of meals and supplements  Intervention:  Increase Breeze to BID. Continue to encourage meals.  Monitor:  Weights, labs, PO intake, I/O's  Adair Laundry Pager #:  [phone]
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
## 2011-07-21 NOTE — Progress Notes (Signed)
Pt and family had many issues with staff and medication throughout night and day. Husband very concerned with her orientation. Pt very confused with random words that doesn't make sense to situation spoken often. Pt non compliant with care and very drowsy and weak.  Staff attempts to meet her needs but pt and husband unsatisfied with pt status.  MD adjusted medications and we will continue to monitor throughout remainder of shift.  Husband very upset with staff and how care was handled today. Brett Canales as well as myself consulted pt and family with issues. Dr. Ferol Luz a psychiatrist meet with pt and family end of shift and consulted needs and issues. Follow to be done per Dr. Ferol Luz request. Dr. Ferol Luz has medication recommendations I will follow up with Dr. Cato Mulligan on call. Cont. To monitor pt.

## 2011-07-21 NOTE — Progress Notes (Signed)
Per State Regulation 482.30 This chart was reviewed for medical necessity with respect to the patient's Admission/Duration of stay. Lane-Morgan, Glyndon Tursi Anne                 Nurse Care Manager            Next Review Date: 07/24/11   

## 2011-07-21 NOTE — Progress Notes (Signed)
Occupational Therapy Session Note  Patient Details  Name: Tonya [name] MRN: [id number] Date of Birth: [birth year]  Today's Date: 07/21/2011 Time: 6578-4696 Time Calculation (min): 45 min  Short Term Goals: Week 1:  OT Short Term Goal 1 (Week 1): Pt will perform basic transfers with max A +2  OT Short Term Goal 2 (Week 1): Pt will perform grooming at the sink mod I OT Short Term Goal 3 (Week 1): Pt will be able to setup meal with mod I OT Short Term Goal 4 (Week 1): Pt will don shirt with min A OT Short Term Goal 5 (Week 1): Pt will tolerate 30 min of funcitonal activity with only 3 rest breaks  Skilled Therapeutic Interventions/Progress Updates:    Significant change in cognition and behavior over night, MD ? meds vs psychosis- fiancee has been present all night and had many questions related to medications so nurse manager was informed and came to speak with him. Self care retraining to try to focus only on dressing at bed level (for safety). Pt with difficulty keeping eyes open, following commands, became confused and disoriented to subject or current task at hand. Resistant to assistance with care despite needing Assistance with all tasks through out session. Donned shrinker with +2 per pt's request. Lateral rolls in bed with total A +2 (pt 10%) with encouragement of using rails for donning/ pulling up pants. Pt with increased difficulty to attend to task and even A with task. Sat Up in bed with total A +2 (pt 0%) to don shirt. Grooming with max cuing for process. Pt with flashbacks acting like she is "being dragged along the pavement" and was jumpy with any person's movement or sounds in the hallway; demonstrating increased anxiety. Pt's fiance present for session and agreed with this OT it was not safe to transfer to a w/c.    Therapy Documentation Precautions:  Precautions Precautions: Fall Restrictions Weight Bearing Restrictions: Yes RUE Weight Bearing: Weight bearing as  tolerated LUE Weight Bearing: Weight bearing as tolerated LLE Weight Bearing: Non weight bearing General: General Amount of Missed OT Time (min): 15 Minutes secondary to limited participation due to cognition and arousal. Pain:  no c/o of pain; soreness with movement of left LE  See FIM for current functional status  Therapy/Group: Individual Therapy  Roney Mans [facility] 07/21/2011, 3:14 PM
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
## 2011-07-22 DIAGNOSIS — F315 Bipolar disorder, current episode depressed, severe, with psychotic features: Secondary | ICD-10-CM

## 2011-07-22 LAB — URINALYSIS, ROUTINE W REFLEX MICROSCOPIC
Hgb urine dipstick: NEGATIVE
Ketones, ur: 15 mg/dL — AB
Specific Gravity, Urine: 1.039 — ABNORMAL HIGH (ref 1.005–1.030)
pH: 5 (ref 5.0–8.0)

## 2011-07-22 LAB — URINE MICROSCOPIC-ADD ON

## 2011-07-22 MED ORDER — WARFARIN SODIUM 7.5 MG PO TABS
7.5000 mg | ORAL_TABLET | Freq: Once | ORAL | Status: AC
  Administered 2011-07-22: 7.5 mg via ORAL
  Filled 2011-07-22 (×2): qty 1

## 2011-07-22 MED ORDER — SALINE SPRAY 0.65 % NA SOLN
1.0000 | NASAL | Status: DC | PRN
  Administered 2011-07-23: 1 via NASAL
  Filled 2011-07-22: qty 44
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
## 2011-07-22 NOTE — Progress Notes (Signed)
Patient's fiance, Geraldine Contras, states that patient's mood was fluctuating last night.  He states that he initially thought the mood swings were caused by a reaction to robaxin, which was discontinued 07/21/11.  He wants to know why her mood swings are continuing and wants another psych consult performed.  Dr. Ferol Luz, psychiatrist, saw the patient on 07/21/11 and gave medication recommendations.  The patient refused to take one of the new recommended medications, congentin.  Will continue to monitor.
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
## 2011-07-22 NOTE — Progress Notes (Signed)
Pt agitated and difficult to concentrate on task at hand, to transfer back to bed with lift, dressing changed to LLE with Vasolin gauze /abd pad/ and gauze,    allevyn dsg applied to left hand, inner/outer aspect of ankle, right hand/arm wrapped with 4/4/kerlix per pt request

## 2011-07-22 NOTE — Progress Notes (Signed)
Physical Therapy Note  Patient Details  Name: Dyana Quaniyah Bugh MRN: [id number] Date of Birth: [birth year] Today's Date: 07/22/2011  1000-1040 (40 minutes) individual Pain: moderate pain LT LE (unrated) with movement/premedicated Focus of treatment : Bed mobility/ transfer training Treatment: Pt required max cues to focus on task (internally distracted); supine to sit max assist + 2 (pt 30%) with head of bed raised; attempted sliding board transfer bed> wc +2 - unable to perform safely; Maxi lift to wc + 2 maintaining assist at LT LE secondary to c/o pain with knee flexed. Pt up in wc grooming at sink. Nurse tech assisting.   Faron Tudisco,JIM 07/22/2011, 10:39 AM

## 2011-07-22 NOTE — Progress Notes (Signed)
Patient will not allow RN to assess the skin on her breasts or buttocks or periarea.  Stump shrinker intact to Right AKA; patient refusing to allow RN to remove shrinker to assess incision.  Patient has multiple allevyns intact to left calf/shin and one to the left knee.  Sutures noted below left knee and on left lateral leg.  Patient allowed RN to change one of three dressings on the left lower leg; other allevyns remained intact.  The patient stated she did not want to be bothered and did not want the RN taking any more time to change the remaining dressings.   The patient had a new order for cogentin, starting 07/21/11.  RN explained the reason the medication was ordered and the patient verbalized understanding and was able to repeat the rationale for the medication.  Patient is aware that the medication is supposed to help prevent some of the involuntary muscle movements she experiences as a result of seroquel but states that she still will not take the cogentin because the jerking movements allow her to move in the bed and she fears she will be unable to move herself with the cogentin in her system.  The patient is aware that her dose of seroquel was increased.  Will continue to monitor.
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
## 2011-07-23 LAB — PROTIME-INR
INR: 1.9 — ABNORMAL HIGH (ref 0.00–1.49)
Prothrombin Time: 22.1 seconds — ABNORMAL HIGH (ref 11.6–15.2)

## 2011-07-23 MED ORDER — WARFARIN SODIUM 7.5 MG PO TABS
7.5000 mg | ORAL_TABLET | Freq: Once | ORAL | Status: AC
  Administered 2011-07-23: 7.5 mg via ORAL
  Filled 2011-07-23: qty 1

## 2011-07-23 NOTE — Progress Notes (Signed)
Dressings changed. Tolerated well
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
## 2011-07-23 NOTE — Progress Notes (Signed)
Dressings changed as ordered. Tolerated well.

## 2011-07-23 NOTE — Progress Notes (Signed)
Occupational Therapy Session Notes  Patient Details  Name: Tonya [name] MRN: [id number] Date of Birth: [birth year]  Today's Date: 07/23/2011  Precautions:  Precautions Precautions: Fall Restrictions Weight Bearing Restrictions: Yes RUE Weight Bearing: Weight bearing as tolerated LUE Weight Bearing: Weight bearing as tolerated BLEs Weight Bearing: Non weight bearing LLE Weight Bearing: Weight bearing as tolerated  Short Term Goals: Week 1:  OT Short Term Goal 1 (Week 1): Pt will perform basic transfers with max A +2  OT Short Term Goal 2 (Week 1): Pt will perform grooming at the sink mod I OT Short Term Goal 3 (Week 1): Pt will be able to setup meal with mod I OT Short Term Goal 4 (Week 1): Pt will don shirt with min A OT Short Term Goal 5 (Week 1): Pt will tolerate 30 min of funcitonal activity with only 3 rest breaks  First session: Time: 0930-1020 Time Calculation (min): 50 min Skilled Therapeutic Interventions/Progress Updates:  Self care retraining to include sponge bath & dress.  Focus session on bed mobility, don shirt with HOB up then patient pulled UB forward to allow shirt to be pulled down.  Roll R & L for bathe and don pants.  Patient perseverates on her perception that the staff don't give her "qualitly care" and states, "I should not have to tell them what to do or how to do their job".  Patient wants staff to do all tasks her way and without regard to the fact that there are other patients on the unit and staff cannot stay with her as long as she would like.  Patient's fiancee was not present during session.   Patient hyper-verbal during session, often contradicting herself, yet pleasant and worked with this Facilities manager. Patient is declining to work with any female staff member at any time or for any reason.  Pain: No c/o pain  See FIM for current functional status  Therapy/Group: Individual  Therapy  -------------------------------------------------------------------------------------- Second session: Time: 2:00-2:45 Time Calculation (min):  45 min  Pain Assessment: No c/o pain  Skilled Therapeutic Interventions: Upon arrival, patient instructing 2 CNAs on how to put her on the bedpan.  Patient demonstrates micro managing and instructing all staff regarding how to perform their job.  Patient often stating that staff don't like her, she is being neglected, call light is not answered fast enough, I should not have to tell staff how to do their job.  Patient is very particular with the way she wants things done and is regularly stating concern for the possibility of germs or infection.  Reviewed goal to transfer from bed to bedside commode for all toileting yet patient states she does not like the sliding board or the lift.  Reviewed that until she is cleared to put weight onto her LLE sliding board will be the method therapy will work toward.  Patient agreed to roll for bedpan to be removed, hygiene and pants up however she was very particular about the need to have 3 different ways to clean herself after urination which took extra time.  Then patient refused to pull up her pants until the RN changed a dressing on her left knee.  Took this opportunity to remind patient that the goal for this session was to practice bed to w/c transfers.  Patient remained in bed at end of session.  Steffanie Rainwater present for few minutes in the middle of the session yet did not stay or have any thing to say.  See FIM for current functional status  Therapy/Group: Individual Therapy  Alisia Vanengen 07/23/2011, 12:02 PM

## 2011-07-23 NOTE — Progress Notes (Signed)
Patient refuses cogentin 0.5mg  this evening, patient fiance at the bedside most of the shift. At around 0150, patient complaining of being hot, temp 99.0. Patient requesting tylenol at that time. Otherwise no other complaints at this time. Patient appears to be resting well currently.

## 2011-07-23 NOTE — Progress Notes (Signed)
Physical Therapy Note  Patient Details  Name: Tonya [name] MRN: [id number] Date of Birth: 1985-08-10 Today's Date: 07/23/2011  1100 Pt refused AM/PM PT treatment - pt will only work with female therapist/aide if female present at all times.   Gavino Fouch,JIM 07/23/2011, 11:18 AM

## 2011-07-24 DIAGNOSIS — F3130 Bipolar disorder, current episode depressed, mild or moderate severity, unspecified: Secondary | ICD-10-CM

## 2011-07-24 LAB — URINE CULTURE
Colony Count: 80000
Culture  Setup Time: 201306221222

## 2011-07-24 MED ORDER — SENNOSIDES-DOCUSATE SODIUM 8.6-50 MG PO TABS
2.0000 | ORAL_TABLET | Freq: Two times a day (BID) | ORAL | Status: DC
  Administered 2011-07-24 – 2011-08-02 (×18): 2 via ORAL
  Administered 2011-08-02: 1 via ORAL
  Administered 2011-08-03 – 2011-08-18 (×31): 2 via ORAL
  Filled 2011-07-24 (×13): qty 2
  Filled 2011-07-24: qty 1
  Filled 2011-07-24 (×5): qty 2
  Filled 2011-07-24: qty 1
  Filled 2011-07-24 (×22): qty 2
  Filled 2011-07-24: qty 1
  Filled 2011-07-24 (×10): qty 2

## 2011-07-24 MED ORDER — CEPHALEXIN 250 MG PO CAPS
250.0000 mg | ORAL_CAPSULE | Freq: Three times a day (TID) | ORAL | Status: AC
  Administered 2011-07-24 – 2011-07-28 (×15): 250 mg via ORAL
  Filled 2011-07-24 (×15): qty 1

## 2011-07-24 MED ORDER — HYDROCODONE-ACETAMINOPHEN 5-325 MG PO TABS
1.0000 | ORAL_TABLET | Freq: Four times a day (QID) | ORAL | Status: DC | PRN
  Administered 2011-07-25: 1 via ORAL
  Administered 2011-07-26: 2 via ORAL
  Administered 2011-07-27 (×3): 1 via ORAL
  Administered 2011-07-29 – 2011-07-31 (×2): 2 via ORAL
  Administered 2011-08-01 – 2011-08-07 (×4): 1 via ORAL
  Administered 2011-08-08: 2 via ORAL
  Administered 2011-08-09 – 2011-08-10 (×2): 1 via ORAL
  Administered 2011-08-17: 2 via ORAL
  Filled 2011-07-24 (×2): qty 1
  Filled 2011-07-24: qty 2
  Filled 2011-07-24: qty 1
  Filled 2011-07-24: qty 2
  Filled 2011-07-24 (×3): qty 1
  Filled 2011-07-24: qty 2
  Filled 2011-07-24 (×2): qty 1
  Filled 2011-07-24: qty 2
  Filled 2011-07-24 (×2): qty 1
  Filled 2011-07-24 (×2): qty 2

## 2011-07-24 MED ORDER — DULOXETINE HCL 60 MG PO CPEP
60.0000 mg | ORAL_CAPSULE | Freq: Every day | ORAL | Status: DC
  Administered 2011-07-25 – 2011-08-18 (×25): 60 mg via ORAL
  Filled 2011-07-24 (×26): qty 1

## 2011-07-24 MED ORDER — HYDROCODONE-ACETAMINOPHEN 5-325 MG PO TABS
1.0000 | ORAL_TABLET | ORAL | Status: DC | PRN
  Administered 2011-07-24: 2 via ORAL
  Filled 2011-07-24: qty 2

## 2011-07-24 MED ORDER — WARFARIN SODIUM 7.5 MG PO TABS
7.5000 mg | ORAL_TABLET | Freq: Every day | ORAL | Status: AC
  Administered 2011-07-24: 7.5 mg via ORAL
  Filled 2011-07-24: qty 1

## 2011-07-24 MED ORDER — HYDROCODONE-ACETAMINOPHEN 5-325 MG PO TABS
1.0000 | ORAL_TABLET | Freq: Four times a day (QID) | ORAL | Status: DC
  Administered 2011-07-24 – 2011-08-18 (×99): 1 via ORAL
  Filled 2011-07-24 (×99): qty 1
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
## 2011-07-24 NOTE — Progress Notes (Signed)
Speech Language Pathology Daily Session Note  Patient Details  Name: Tonya [name] MRN: [id number] Date of Birth: [birth year]  Today's Date: 07/24/2011 Time: 1030-1100 Time Calculation (min): 30 min  Short Term Goals: Week 1: SLP Short Term Goal 1 (Week 1): Pt will utilize swallowing compensatory strategies with Mod I without overt s/s of aspiration SLP Short Term Goal 2 (Week 1): Pt will appropriately thicken liquids with Mod I.   Skilled Therapeutic Interventions: Treatment focus on swallowing function with possible upgrade.  Pt consumed trials of thin via tsp without overt s/s of aspiration, however, pt unwilling to consume thin via cup due to "teeth sensitivity." Consumed trials of thin via straw with cough X 2 out of 5 trials. Recommend pt to continue with honey thick liquids until pt can have increased participation and overall attention with diagnostic trials.    FIM:  Comprehension Comprehension Mode: Auditory Comprehension: 2-Understands basic 25 - 49% of the time/requires cueing 51 - 75% of the time Expression Expression Mode: Verbal Expression: 2-Expresses basic 25 - 49% of the time/requires cueing 50 - 75% of the time. Uses single words/gestures. Social Interaction Social Interaction: 2-Interacts appropriately 25 - 49% of time - Needs frequent redirection. Problem Solving Problem Solving: 1-Solves basic less than 25% of the time - needs direction nearly all the time or does not effectively solve problems and may need a restraint for safety Memory Memory: 1-Recognizes or recalls less than 25% of the time/requires cueing greater than 75% of the time FIM - Eating Eating Activity: 4: Help with managing cup/glass  Pain No/Denies Pain  Therapy/Group: Individual Therapy  Makailee Nudelman 07/24/2011, 4:37 PM

## 2011-07-24 NOTE — Progress Notes (Signed)
Patient participating in therapy and with nursing this pm . No complaints of severe pain . Patient able to assist nursing with mod assist slide board transfer into bed from wheelchair . Patient continent this shift. Patient very upset and crying this am . Patient required emotional reassurance for over 1 hour 30 minutes to settle down from crying . Patient spit out meds this am but has taken meds without any issue this pm . Continue to provide every hour checks to patient . Continue with  plan of care .       Tonya [name]
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
## 2011-07-24 NOTE — Progress Notes (Signed)
Occupational Therapy Session Note  Patient Details  Name: Tonya [name] MRN: [id number] Date of Birth: [birth year]  Today's Date: 07/24/2011 Time: 0930-1030 Time Calculation (min): 60 min  Short Term Goals: Week 1:  OT Short Term Goal 1 (Week 1): Pt will perform basic transfers with max A +2  OT Short Term Goal 2 (Week 1): Pt will perform grooming at the sink mod I OT Short Term Goal 3 (Week 1): Pt will be able to setup meal with mod I OT Short Term Goal 4 (Week 1): Pt will don shirt with min A OT Short Term Goal 5 (Week 1): Pt will tolerate 30 min of funcitonal activity with only 3 rest breaks  Skilled Therapeutic Interventions/Progress Updates:    1:1 self care retraining: Pt in bed trying to take meds but declining some and questioning all of them (what they are, why she has to take them and perseverating on her sore throat and swallowing. This OT Trying to be supportive of pt but challenging to redirect or have her agree to a "therapy plan" of completing an ADL task- despite her continuing to ask about her bottom cleanliness and how sweaty her clothes are. When Trying to assist with doffing clothing with rolling, pt resisting help and not being an active participant and again talking about being dirty. Pt unable to reason and flopping around in bed with eyes rolling. Total A +2 for all mobility and for donning clothing. Pt wouldn't even assist with donning shirt (ie lifting head) with resistance to touch.  1:1 pm session 14:30-15:00 ( ) Pt more upbeat this pm and ready to participate in therapy. Pt reports no pain. Pt came to EOB with mod A with use of bed rails and HOB elevated. Pt able to sit EOB for 30 min with left LE positioned extending out supported in w/c. Pt able to sit EOB and engage in goal writing (making a contract between her and therapy) including daily goals, daily schedule and goals to work towards to go home. Pt wanted to wash and fix her hair so set her up at EOB  and proceeded to fix her own hair while she waited for PT to arrive.   Therapy Documentation Precautions:  Precautions Precautions: Fall Restrictions Weight Bearing Restrictions: Yes RUE Weight Bearing: Weight bearing as tolerated LUE Weight Bearing: Weight bearing as tolerated RLE Weight Bearing: Weight bearing as tolerated LLE Weight Bearing: Weight bearing as tolerated Pain:  ongoing c/o pain in left LE and left hand- RN aware. Tried to assist with repositioning  See FIM for current functional status  Therapy/Group: Individual Therapy  Roney Mans [facility] 07/24/2011, 12:04 PM

## 2011-07-24 NOTE — Progress Notes (Addendum)
Physical Therapy Session Note  Patient Details  Name: Tonya [name] MRN: [id number] Date of Birth: 08/14/1985  Today's Date: 07/24/2011 Time: 1191-4782 Time Calculation (min): 39 min  Short Term Goals: Week 1:  PT Short Term Goal 1 (Week 1): Pt will perform static sitting balance EOB x 10 minutes with supervision PT Short Term Goal 2 (Week 1): Pt will perform bed mobility with max A PT Short Term Goal 3 (Week 1): Pt will perform bed <> w/c transfer with +2 pt 65%  Skilled Therapeutic Interventions/Progress Updates:    Pt participating much, much better (~90%) of therapy this afternoon, alert and upbeat. Pt reports her pain is now under control and she is not letting "stress" get to her. Pt reports she is ready to work on what needs to be completed to go home. At start of treatment pt already sitting EOB without UE support finishing washing hair with nursing. Performed sliding board transfer (pt not thrilled about use of sliding board) with +2 total assist, pt = 50-60%. Pt requires max verbal cues for initiation, UE placement, sequencing, and weight shift. PT to place sliding board, wheelchair and managed all parts. Pt's Lt. LE must be supported during transfer secondary to pain. Wheelchair mobility performed in room and in hallway into day room. Pt very talkative with staff.   Therapy Documentation Precautions:  Precautions Precautions: Fall Restrictions Weight Bearing Restrictions: Yes RUE Weight Bearing: Weight bearing as tolerated LUE Weight Bearing: Weight bearing as tolerated RLE Weight Bearing: Non weight bearing LLE Weight Bearing: Non weight bearing Pain: Pain Assessment Pain Assessment: 0-10 Pain Score:   5 Pain Type: Acute pain Pain Location: Leg Pain Orientation: Left Pain Descriptors: Aching Pain Onset:  (when lowered down for transfer) Pain Intervention(s): Repositioned (propped Lt. LE up for transfer) Locomotion : Wheelchair Mobility Distance: 100'    See  FIM for current functional status  Therapy/Group: Individual Therapy  [name] 07/24/2011, 4:48 PM
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
## 2011-07-24 NOTE — Progress Notes (Addendum)
Physical Therapy Session Note  Patient Details  Name: Tonya [name] MRN: [id number] Date of Birth: 11-21-1985  Today's Date: 07/24/2011 Time: 1105-1200 Time Calculation (min): 55 min  Short Term Goals: Week 1:  PT Short Term Goal 1 (Week 1): Pt will perform static sitting balance EOB x 10 minutes with supervision PT Short Term Goal 2 (Week 1): Pt will perform bed mobility with max A PT Short Term Goal 3 (Week 1): Pt will perform bed <> w/c transfer with +2 pt 65%  Skilled Therapeutic Interventions/Progress Updates:    Pt not progressing towards goals this session, participating with therapy <15% of time. Pt required +2 total assist for all bed mobility including rolling and supine/side to sit, contributed approximately 20% to task after max verbal cues (able to minimally advance LEs to side of bed and reach for railing). Pt sat EOB for approximately 45 min with fluctuating need for assist from min-guard to +2 total assist. Pt "flops" head/trunk forwards and backwards when asked to perform task however has excellent posture and stability when the telephone rings and she thinks it is her boyfriend calling. Session focused on increasing attention, promoting independence with task, and improving sitting balance to prepare for sliding board transfers. Pt's eyes closed 90% of session.   Pt most attentive when discussing fiance and when discussing ways to promote eventual good prosthetic fit.      Therapy Documentation Precautions:  Precautions Precautions: Fall Restrictions Weight Bearing Restrictions: Yes RUE Weight Bearing: Weight bearing as tolerated LUE Weight Bearing: Weight bearing as tolerated RLE Weight Bearing: Non weight bearing LLE Weight Bearing: Non weight bearing Pain: Pt with c/o hurting all over, called RN who reported pt had just received medication. Pt not responding when asked for numerical rating.    See FIM for current functional status  Therapy/Group:  Individual Therapy  [name] 07/24/2011, 5:02 PM

## 2011-07-25 ENCOUNTER — Inpatient Hospital Stay (HOSPITAL_COMMUNITY): Payer: Medicare Other

## 2011-07-25 DIAGNOSIS — IMO0002 Reserved for concepts with insufficient information to code with codable children: Secondary | ICD-10-CM

## 2011-07-25 DIAGNOSIS — Z5189 Encounter for other specified aftercare: Secondary | ICD-10-CM

## 2011-07-25 DIAGNOSIS — F3110 Bipolar disorder, current episode manic without psychotic features, unspecified: Secondary | ICD-10-CM

## 2011-07-25 DIAGNOSIS — S82839A Other fracture of upper and lower end of unspecified fibula, initial encounter for closed fracture: Secondary | ICD-10-CM

## 2011-07-25 DIAGNOSIS — Z9119 Patient's noncompliance with other medical treatment and regimen: Secondary | ICD-10-CM

## 2011-07-25 DIAGNOSIS — S82109A Unspecified fracture of upper end of unspecified tibia, initial encounter for closed fracture: Secondary | ICD-10-CM

## 2011-07-25 LAB — PROTIME-INR
INR: 2.03 — ABNORMAL HIGH (ref 0.00–1.49)
Prothrombin Time: 23.3 seconds — ABNORMAL HIGH (ref 11.6–15.2)

## 2011-07-25 MED ORDER — QUETIAPINE FUMARATE ER 200 MG PO TB24
200.0000 mg | ORAL_TABLET | Freq: Every day | ORAL | Status: DC
  Administered 2011-07-25 – 2011-07-26 (×2): 200 mg via ORAL
  Filled 2011-07-25 (×3): qty 1

## 2011-07-25 MED ORDER — WARFARIN SODIUM 10 MG PO TABS
10.0000 mg | ORAL_TABLET | Freq: Once | ORAL | Status: AC
  Administered 2011-07-25: 10 mg via ORAL
  Filled 2011-07-25: qty 1

## 2011-07-25 NOTE — Progress Notes (Signed)
Patient found in floor in supine position by rehab tech in front of sink in front of wheelchair at 1025. . Breaks not locked. Vital signs taken and patient assessed . Assisted back to bed via maxi move lift. No injury noted . Patient and patient's fiancee denied patient hit her head . Steffanie Rainwater reported patient was leaning to sink and slid out of wheelchair . Patient reported back pain and fiancee reported she has had back pain in the past . D. Anguilli PA notified orders received. Continue with plan of care. Quick release belt reviewed with patient and fiancee to wear up in chair for support and safety . Both verbalized understanding .           Cleotilde Neer

## 2011-07-25 NOTE — Progress Notes (Signed)
Occupational Therapy Session Note  Patient Details  Name: Tonya [name] MRN: [id number] Date of Birth: 01/14/1986  Today's Date: 07/25/2011 Time: 1308-6578 Time Calculation (min): 20 min  Short Term Goals: Week 1:  OT Short Term Goal 1 (Week 1): Pt will perform basic transfers with max A +2  OT Short Term Goal 2 (Week 1): Pt will perform grooming at the sink mod I OT Short Term Goal 3 (Week 1): Pt will be able to setup meal with mod I OT Short Term Goal 4 (Week 1): Pt will don shirt with min A OT Short Term Goal 5 (Week 1): Pt will tolerate 30 min of funcitonal activity with only 3 rest breaks  Skilled Therapeutic Interventions/Progress Updates:    Co tx with another skilled OT clinician 10:30-11:05  1:1 self care retraining: Pt sitting EOB with supervision and Had already completed UB bathing when this OT arrived. Focus on LB bathing and dressing with lateral leans with max A for positioning and cuing. Pt kept eyes closed for all mobility related tasks despite encouraging her to look at what she is doing (including lateral leans and slide board transfers). Focused on lifting up hips with each lateral lean for washing periarea and bottom, pulling up underwear, pulling dress down, and placing slide board under right thigh. Pt allowed left LE to be down to the floor with left knee straight in order to transfer into w/c. Total A +2 pt performing 20%, with total cuing for safe positioning and task sequence since her eyes were closed. Pt requested personal demands throughout session often unrelated to present task at hand, having difficulty attending and completing basic ADL tasks in a timely manner. Pt doesn't like to be told how to perform tasks but is unsure how to assist herself in tasks- therapists continued to provide encouragement and limit extra conversation when can. Once in chair, reviewed with pt and her fiancee how to boost self in chair for pressure relief and how to operate brakes.  Left pt with fiancee at the sink to perform grooming. Steffanie Rainwater agreed he could assist her or call for assistance.  Therapy Documentation Precautions:  Precautions Precautions: Fall Restrictions Weight Bearing Restrictions: Yes RUE Weight Bearing: Weight bearing as tolerated LUE Weight Bearing: Weight bearing as tolerated RLE Weight Bearing: Weight bearing as tolerated LLE Weight Bearing: Weight bearing as tolerated    Vital Signs: Therapy Vitals Temp: 99.1 F (37.3 C) Temp src: Oral Pulse Rate: 123  Resp: 22  BP: 158/98 mmHg Patient Position, if appropriate: Sitting Oxygen Therapy SpO2: 98 % O2 Device: None (Room air) Pain: Pain Assessment Pain Assessment: 0-10 Pain Score:   5 Pain Type: Chronic pain Pain Location: Back Pain Orientation: Lower Pain Descriptors: Aching Pain Frequency: Occasional Pain Onset: Gradual Patients Stated Pain Goal: 2 Pain Intervention(s): Medication (See eMAR) (vicodin 1 po)  See FIM for current functional status  Therapy/Group: Individual Therapy, Co-Treatment with another skilled OT  Roney Mans [facility] 07/25/2011, 1:28 PM

## 2011-07-25 NOTE — Progress Notes (Signed)
Angie RN and I assessed pts right forearm wound together and the 2 sutures that remained in place; unable to visualize the end to the most distal suture in order to remove it. Pt asked several times if she can attempt to pull the suture out herself if we have gauze ready to "catch the blood". Explained to pt that this is not a safe or reasonable thing to do due to the risk of infection and trauma to the area, she verbalized understanding. Per her request applied bacitracin to the area and wrapped with with gauze and coban so she would not be distracted by it. Let her know we will have the MD or PA look at the sutures tomorrow and try to remove them.  I spent approximately one hour in the room with the pt listening and allowing her to talk about her incident and the circumstances that led up to it, issues from her childhood, her views on her recovery and life after she leaves here, etc. She reported it was nice to have someone just listen to her for once. Her speech was rapid but overall her mood was pleasant and appropriate. Tonight she took all of her meds as scheduled without difficulty after we discussed the purpose and rationale behind changing the doses including seroquel 200mg  and cogentin 0.5mg . Continue to monitor.

## 2011-07-25 NOTE — Progress Notes (Signed)
sutures to left leg and knee discontinued . Right incision to amputation remain intact . Attempted to discontinue right forearm sutures and unable to find remaining end to suture . D. Anguilli PA aware. Continue with plan of care.  Cleotilde Neer

## 2011-07-25 NOTE — Progress Notes (Addendum)
Speech Language Pathology Daily Session Note  Patient Details  Name: Tonya [name] MRN: [id number] Date of Birth: Nov 14, 1985  Today's Date: 07/25/2011 Time: 7829-5621 Time Calculation (min): 45 mins  Short Term Goals: Week 1: SLP Short Term Goal 1 (Week 1): Pt will utilize swallowing compensatory strategies with Mod I without overt s/s of aspiration SLP Short Term Goal 2 (Week 1): Pt will appropriately thicken liquids with Mod I.   Skilled Therapeutic Interventions: Treatment focus on trials of thin liquids for possible upgrade.  Pt consumed trials of thin liquids via tsp and cup without overt s/s of aspiration. Pt also consumed trials of thin via straw with intermittent coughing due to large sips and decreased attention to task (talking while consuming liquids).  Recommend pt continue dys. 3 textures with thin small, single sips via cup and straw and intermittent supervision.    FIM:  Comprehension Comprehension Mode: Auditory Comprehension: 5-Understands basic 90% of the time/requires cueing < 10% of the time Expression Expression: 5-Expresses basic 90% of the time/requires cueing < 10% of the time. Social Interaction Social Interaction: 4-Interacts appropriately 75 - 89% of the time - Needs redirection for appropriate language or to initiate interaction. Problem Solving Problem Solving: 4-Solves basic 75 - 89% of the time/requires cueing 10 - 24% of the time Memory Memory: 4-Recognizes or recalls 75 - 89% of the time/requires cueing 10 - 24% of the time FIM - Eating Eating Activity: 5: Supervision/cues  Pain Pain Assessment Pain Assessment: 0-10 Pain Score:   5 Pain Type: Chronic pain Pain Location: Back Pain Orientation: Lower Pain Descriptors: Aching Pain Frequency: Occasional Pain Onset: Gradual Patients Stated Pain Goal: 2 Pain Intervention(s): Medication (See eMAR) (vicodin 1 po)  Therapy/Group: Individual Therapy  Gilverto Dileonardo 07/25/2011, 3:24 PM
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
## 2011-07-25 NOTE — Progress Notes (Signed)
Pt has been pleasant throughout shift and in good spirits. Given meds at 2100, agreed to take full dose (150mg ) of seroquel and with encouragement from staff and boyfriend agreed to try cogentin tonight as long as we kept drink at the bedside to combat her dry mouth. After taking meds pt slept approximately 2 hours and awoke around midnight. At this time pt was hyperverbal, uncontrollably laughing, and required max cueing to focus on task at hand (she called for the bedpan, but did not void until staff asked her to refrain from telling stories for a moment and finish toileting). Pt remains awake at this time and boyfriend elected to sleep in lounge because pt is too "worked up" and believes his presence in room causes frustration for pt. Checked on pt shortly after boyfriend left room at his request and she seemed to calmed down somewhat, but continued to be very talkative and jumped from subject to subject with no apparent connection between the things she was talking about. Will continue to monitor.

## 2011-07-25 NOTE — Progress Notes (Addendum)
Met with pt fiance with RN in room.  There was much discussion about medications, schedule of meds, side effects and dosages.  There is observed anxiety about every aspect of her trearment and fiance and pt's commentary about every perceived discrepancy in their expectations of care.  Discussion repeatedly returns to what was done/not done/etc.   Pt is referred to discuss all concerns about pt care to her nurse.  Axis I:  Bipolar Disorder, Depression with suicide attempt Tonya [name] is talking coherently but speaks rapidly with her own agenda.  It is difficult for her to listen and integrate information given to her.  She is on the defensive claiming she is only one to understand her bipolar condition. This interview is conducted with following goals 1.  Pt had a very short period of sleep.  Regenerative rest is essential to healing an to her ability to participate and cooperate in her rehab activities:  Consider an increase in Seroquel XR 200 mg 2 hours before sleep to facilitate sleep and calm her manic speech and thoughts. 2.  Recommend team plan to consolidate efforts in identifying a set number of staff.  Suggest keep staff limited to no more than 2 at a time in pt's room. 3. Suggest presenting overview of what to accomplish is remaining time for rehab.  4. Will follow pt.Tonya [name] J. Ferol Luz, MD Psychiatrist  07/25/2011 8:21 PM

## 2011-07-25 NOTE — Progress Notes (Addendum)
Physical Therapy Session Note  Patient Details  Name: Tonya [name] MRN: [id number] Date of Birth: 06-20-1985  Today's Date: 07/25/2011 Time: 1112-1205 Time Calculation (min): 53 min  Short Term Goals: Week 1:  PT Short Term Goal 1 (Week 1): Pt will perform static sitting balance EOB x 10 minutes with supervision PT Short Term Goal 2 (Week 1): Pt will perform bed mobility with max A PT Short Term Goal 3 (Week 1): Pt will perform bed <> w/c transfer with +2 pt 65%  Skilled Therapeutic Interventions/Progress Updates:    PA in room, OK for pt to perform as much physical activity as possible. Pt with minimal participation, needs near constant redirection and encouragement for any activity. Pt claims hospital staff trying to keep her here forever, when reminded of goals states that hospital is trying to kick her out too fast. Pt repeatedly calling staff b*&$%es. Pt reports that she was not educated on how to use wheelchair even though pt fully taught use of brakes, negotiation in room, and leg rest management with last session yesterday. Pt refused to give PT explanation of how she fell out of wheelchair.   During session pt performed side lying Rt. Hip extension exercises to decrease risk for hip contractor, educated on risk of hip contracture and effect it would have on getting a prosthesis. Pt performed rolling Lt. And Lt. Side lying to sit with +2 total assist (pt refused additional PT help, therefore assist came from PT and pt's fiance). Fiance provided handhold assist and support of Lt. LE. PT provided 75% of effort at trunk for pt to reach sitting, verbal cues for sequencing and efficiency. Pt refused to return to supine and desired to eat lunch sitting EOB. Nurse tech in room to provide supervision until pt finishes.   Pt reported no increase in any pain with transfer to sitting. Did not complain of back pain or neck pain.  Therapy Documentation Precautions:  Precautions Precautions:  Fall Restrictions Weight Bearing Restrictions: Yes RUE Weight Bearing: Weight bearing as tolerated LUE Weight Bearing: Weight bearing as tolerated RLE Weight Bearing: Weight bearing as tolerated LLE Weight Bearing: Weight bearing as tolerated Vital Signs: Therapy Vitals Temp: 99.1 F (37.3 C) Temp src: Oral Pulse Rate: 123  Resp: 22  BP: 158/98 mmHg Patient Position, if appropriate: Sitting Oxygen Therapy SpO2: 98 % O2 Device: None (Room air) Pain: Pain Assessment Pain Assessment: 0-10 Pain Score:   5 low back pain, buttock pain that has been present since start of hospitalization.   See FIM for current functional status  Therapy/Group: Individual Therapy  [name] 07/25/2011, 12:19 PM

## 2011-07-25 NOTE — Progress Notes (Signed)
Occupational Therapy Session Note  Patient Details  Name: Tonya [name] MRN: [id number] Date of Birth: May 03, 1985  Today's Date: 07/25/2011 Time: 8295-6213 Time Calculation (min): 45 min (hour session with OT cotreat last 30 minutes)  Short Term Goals: Week 1:  OT Short Term Goal 1 (Week 1): Pt will perform basic transfers with max A +2  OT Short Term Goal 2 (Week 1): Pt will perform grooming at the sink mod I OT Short Term Goal 3 (Week 1): Pt will be able to setup meal with mod I OT Short Term Goal 4 (Week 1): Pt will don shirt with min A OT Short Term Goal 5 (Week 1): Pt will tolerate 30 min of funcitonal activity with only 3 rest breaks  Skilled Therapeutic Interventions:  Self care retraining to include sponge bath and dressing EOB with LLE propped up on chair per patient's request.  Focus session on patient focus on task secondary to patient very internally and externally distracted which required max-mod cues to redirect.  Patient chose to wear a dress despite recommendation to wear pants to make slide board transfer easier.  Patient required additional assistance at times and declined to complete various tasks and/or mobility techniques she has completed in the past.  Patient occasionally unsafe with mobility even after instructed to move and/or assist in a safe manner.  Patient transfers bed>w/c via slide board +3 as patient required that her fiance hold her hand to assist her midway through the transfer.  Patient instructed on use of brakes and patient's fiance reported he was comfortable monitoring patient while she was up in w/c.  Patient reported she would sit at the sink to complete some grooming task now that she was in the w/c.  Precautions:  Precautions Precautions: Fall Restrictions Weight Bearing Restrictions: Yes RUE Weight Bearing: Weight bearing as tolerated LUE Weight Bearing: Weight bearing as tolerated RLE Weight Bearing: Non weight bearing LLE Weight Bearing:  Non weight bearing  Pain: No c/o pain except with mobility during ADL yet unable to state where pain was located.  RN provided medication during session.   See FIM for current functional status  Therapy/Group: Individual Therapy, Co-Treatment last 30 minutes with OT and patient's fiance present for session  Tonya [name] 07/25/2011, 5:34 PM

## 2011-07-25 NOTE — Progress Notes (Signed)
Occupational Therapy Note  Patient Details  Name: Samina Kory Panjwani MRN: [id number] Date of Birth: [birth year] Today's Date: 07/25/2011  Pt missed 30 min of OT due to pt being off unit in xray.   Roney Mans [facility] 07/25/2011, 2:08 PM

## 2011-07-25 NOTE — Progress Notes (Signed)
Social Work Patient ID: Tonya [name], female   DOB: Tonya [name], 1987, 26 y.o.   MRN: [id number]  Spoke briefly with pt's fiance this afternoon as he was meeting with RN CM to review team conference info.  She had informed him of team concerns with patient's mental health issues and behavior as it was affected her participation with therapies.  He is aware that the target d/c date of 7/3 has been set, however, he feels this is too short a stay and that she will not be ready.  He is currently looking into potential handicap accessible housing for the two of them to move into at d/c and he feels he can secure something rather quickly.  Discussion with him about need for caregiver education with plan that his cousin's girlfriend will be here on Friday for ed.  Later, I met with patient to review team conference info as well.  She did acknowledge that she had heard the target date information, but also feels this is too short a LOS.  Attempted some level of discussion with her about the reasoning behind to no avail.  She presented in very manic state with her speech and could not be redirected at any point.  Finally had to tell her that I would leave to let her try and rest and would talk to her tomorrow, however, pt continued to talk as I was leaving the room.  Unfortunately, pt's mental health issues are severely impeding team's ability to make any progress.  I will contact Psych SW tomorrow for some advice on proceeding with this patient and her treatment plan.  [employer]
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
## 2011-07-26 DIAGNOSIS — F3110 Bipolar disorder, current episode manic without psychotic features, unspecified: Secondary | ICD-10-CM

## 2011-07-26 DIAGNOSIS — Z9119 Patient's noncompliance with other medical treatment and regimen: Secondary | ICD-10-CM

## 2011-07-26 LAB — PROTIME-INR
INR: 2.35 — ABNORMAL HIGH (ref 0.00–1.49)
Prothrombin Time: 26.1 seconds — ABNORMAL HIGH (ref 11.6–15.2)

## 2011-07-26 MED ORDER — GABAPENTIN 300 MG PO CAPS
300.0000 mg | ORAL_CAPSULE | Freq: Every day | ORAL | Status: DC
  Administered 2011-07-26: 300 mg via ORAL
  Filled 2011-07-26 (×2): qty 1

## 2011-07-26 MED ORDER — GABAPENTIN 100 MG PO CAPS
100.0000 mg | ORAL_CAPSULE | Freq: Two times a day (BID) | ORAL | Status: DC
  Administered 2011-07-26 (×2): 100 mg via ORAL
  Filled 2011-07-26 (×5): qty 1

## 2011-07-26 MED ORDER — WARFARIN SODIUM 10 MG PO TABS: 10.0000 mg | ORAL_TABLET | ORAL | Status: DC

## 2011-07-26 MED ORDER — WARFARIN SODIUM 7.5 MG PO TABS
7.5000 mg | ORAL_TABLET | ORAL | Status: AC
  Administered 2011-07-26: 7.5 mg via ORAL
  Filled 2011-07-26: qty 1

## 2011-07-26 NOTE — Progress Notes (Signed)
Pt is involved with PT at time of visit.  Pt dismisses Psychiatrist and request another day to visit.  Discussed with RN, Carlisle Beers and Brett Canales.  She is also being visited by 'nephew' and her fiance.  Pt is alert, awake folding shorts and tops.  Discussed also with Janeann Forehand.  Axis I bipolar D/O manic phase Noted: pt took 200 mg Seroquel XR last night and slept for a longer period of time.  Reggie Welge J. Ferol Luz, MD Psychiatrist  07/26/2011  1:16 PM
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
## 2011-07-26 NOTE — Progress Notes (Signed)
Occupational Therapy Session Note  Patient Details  Name: Tonya [name] MRN: [id number] Date of Birth: March 31, 1985  Today's Date: 07/26/2011 Time: 0900-1000 Time Calculation (min): 60 min  Short Term Goals: Week 1:  OT Short Term Goal 1 (Week 1): Pt will perform basic transfers with max A +2  OT Short Term Goal 2 (Week 1): Pt will perform grooming at the sink mod I OT Short Term Goal 3 (Week 1): Pt will be able to setup meal with mod I OT Short Term Goal 4 (Week 1): Pt will don shirt with min A OT Short Term Goal 5 (Week 1): Pt will tolerate 30 min of funcitonal activity with only 3 rest breaks  Skilled Therapeutic Interventions/Progress Updates:  Patient in bed upon arrival with music softly playing on her phone and whispering during our initial interactions for first ~5 minutes.  Patient became irritated that I needed her to repeat everything she said then began to speak in a more normal tone.  Patient stated, "are you here to bathe me and dress me up like a babydoll?", "I can bathe and dress myself".  Patient instructed this clinician to take all of her clothes out of the top drawer and lay them over the foot board of the bed then also take all of the clothes out of second drawer and lay out those clothes as well.   After increased time, patient chose clothes to wear today yet did not have any pants so she called fiancee to see if he took her dirty clothes to wash them and he had.  Set patient up to perform bath with HOB flat secondary she declined to sit EOB as her goals stated because she was still traumatized by the fall from w/c yesterday and "I'm not going to hurt my body any more than it is".  During bath, patient removed dressing from left breast and proceeded to wipe wound with bath clothe she had been using for bath.  RN notified and applied fresh dressing.  Patient continues to be hyper verbal, rapid topic switching, poor attention, very internally and externally distracted, and  difficult to redirect.  When attempts to redirect her focus on task of bath and dress, patient becomes demanding and accusatory stating that this clinician is not listening to her.  Patient had only completed her bath by the end of the 60 minute session.   Precautions:  Precautions Precautions: Fall Restrictions Weight Bearing Restrictions: Yes RUE Weight Bearing: Weight bearing as tolerated LUE Weight Bearing: Weight bearing as tolerated RLE Weight Bearing: Non weight bearing LLE Weight Bearing: Non weight bearing  Pain: Patient stating uncomfortable feeling in abdomen stating unsure if it is from shots he receives or from constipation.  Not rated and no pain meds requested.   See FIM for current functional status  Therapy/Group: Individual Therapy  [name] 07/26/2011, 6:08 PM
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
## 2011-07-26 NOTE — Progress Notes (Signed)
PSYCHOLOGY Name:  Graclyn Ilanna Deihl  MRN:   [id number]  Date of Birth:  1985/07/19 Date:   07/26/11  She states "Can you be hopeful and in pain at the same time?"  She continues to present as manic with poor attention span, pressured speech and rapid topic switching. Her mood was positive with future plans stated. No report of suicidal ideation. She was eager to show me her picture album of mostly family members.  At this time she has minimal insight into her mood disorder or her sub-optimal participation in rehab therapies.    Eula Flax, Ph.D
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
## 2011-07-26 NOTE — Progress Notes (Signed)
Speech Language Pathology Daily Session Note  Patient Details  Name: Tonya [name] MRN: [id number] Date of Birth: 12-Feb-1985  Today's Date: 07/26/2011 Time: 1000-1030 Time Calculation (min): 30 min  Short Term Goals: Week 1: SLP Short Term Goal 1 (Week 1): Pt will utilize swallowing compensatory strategies with Mod I without overt s/s of aspiration SLP Short Term Goal 2 (Week 1): Pt will appropriately thicken liquids with Mod I.   Skilled Therapeutic Interventions: Treatment focus on safely swallowing medications whole with thin liquids. Pt demonstrated cough X 1 due to large, consecutive sips via straw. Pt required verbal cues to utilize small, single sips and proprer positioning which eliminated overt s/s of aspiration.    FIM:  Comprehension Comprehension Mode: Auditory Comprehension: 4-Understands basic 75 - 89% of the time/requires cueing 10 - 24% of the time Expression Expression: 5-Expresses basic 90% of the time/requires cueing < 10% of the time. Social Interaction Social Interaction: 3-Interacts appropriately 50 - 74% of the time - May be physically or verbally inappropriate. Problem Solving Problem Solving: 3-Solves basic 50 - 74% of the time/requires cueing 25 - 49% of the time Memory Memory: 3-Recognizes or recalls 50 - 74% of the time/requires cueing 25 - 49% of the time FIM - Eating Eating Activity: 5: Supervision/cues  Pain Pain Assessment Pain Assessment: No/denies pain  Therapy/Group: Individual Therapy  Holdyn Poyser 07/26/2011, 4:36 PM

## 2011-07-26 NOTE — Progress Notes (Signed)
After taking increased dose of seroquel 200mg , pt slept from approximately 2330 to 0400, which is over twice as long as she has slept any other night.

## 2011-07-27 DIAGNOSIS — F3110 Bipolar disorder, current episode manic without psychotic features, unspecified: Secondary | ICD-10-CM

## 2011-07-27 DIAGNOSIS — F430 Acute stress reaction: Secondary | ICD-10-CM

## 2011-07-27 LAB — PROTIME-INR
INR: 2.19 — ABNORMAL HIGH (ref 0.00–1.49)
Prothrombin Time: 24.7 seconds — ABNORMAL HIGH (ref 11.6–15.2)

## 2011-07-27 MED ORDER — LORAZEPAM 0.5 MG PO TABS: 0.5000 mg | ORAL_TABLET | Freq: Three times a day (TID) | ORAL | Status: DC

## 2011-07-27 MED ORDER — WHITE PETROLATUM GEL
Status: AC
  Filled 2011-07-27: qty 5

## 2011-07-27 MED ORDER — WARFARIN SODIUM 10 MG PO TABS
10.0000 mg | ORAL_TABLET | ORAL | Status: DC
  Administered 2011-07-27: 10 mg via ORAL
  Filled 2011-07-27: qty 1

## 2011-07-27 MED ORDER — DIVALPROEX SODIUM 500 MG PO DR TAB
500.0000 mg | DELAYED_RELEASE_TABLET | Freq: Two times a day (BID) | ORAL | Status: DC
  Filled 2011-07-27 (×4): qty 1

## 2011-07-27 MED ORDER — OLANZAPINE 7.5 MG PO TABS
15.0000 mg | ORAL_TABLET | Freq: Every day | ORAL | Status: DC
  Administered 2011-07-27 – 2011-08-18 (×23): 15 mg via ORAL
  Filled 2011-07-27 (×26): qty 2

## 2011-07-27 MED ORDER — LORAZEPAM 0.5 MG PO TABS
0.5000 mg | ORAL_TABLET | Freq: Four times a day (QID) | ORAL | Status: DC | PRN
Start: 2011-07-27 — End: 2011-08-18

## 2011-07-27 MED ORDER — GABAPENTIN 300 MG PO CAPS
300.0000 mg | ORAL_CAPSULE | Freq: Three times a day (TID) | ORAL | Status: DC
  Administered 2011-07-27 – 2011-07-30 (×12): 300 mg via ORAL
  Filled 2011-07-27 (×16): qty 1

## 2011-07-27 MED ORDER — LORAZEPAM 0.5 MG PO TABS: 0.5000 mg | ORAL_TABLET | Freq: Three times a day (TID) | ORAL | Status: DC | PRN

## 2011-07-27 MED ORDER — QUETIAPINE FUMARATE ER 50 MG PO TB24
100.0000 mg | ORAL_TABLET | Freq: Every day | ORAL | Status: DC
  Administered 2011-07-27 – 2011-08-17 (×22): 100 mg via ORAL
  Filled 2011-07-27 (×23): qty 2

## 2011-07-27 MED ORDER — WARFARIN SODIUM 7.5 MG PO TABS
7.5000 mg | ORAL_TABLET | ORAL | Status: DC
  Filled 2011-07-27: qty 1

## 2011-07-27 NOTE — Progress Notes (Signed)
Late entry for 07/26/11. Pt. Presents as 'manic'; hyperverbal, flight of ideas.  Inattentive,impaired judgement and safety awareness. Attempts to direct care, poor choices. Angry affect this AM. Demanding.  Incontinent urine x2, BM ( soft formed)  x1 this am. States "It just happens, clean me up." Incontinent night shift as well per report;  Refuses cogentin, iron. Vigilant of all care, meds. Pain controlled with scheduled Vicodin, no PRNs necessary. Also with c/o phantom pain. Attempted to discuss interventions for relief, declined.  OOB to w/c this afternoon, wheeled around unit,intrusive behavior, does not respond to limit setting.Tonya [name]
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
## 2011-07-27 NOTE — Progress Notes (Signed)
Pt presents somewhat less 'manic' but with angry affect most of shift; demanding; attempts to direct care, poor judgement. Argumentative, hypervigilant. Did take all meds today except 1 dose of iron. Refuses stump shrinker. Pulled off all Allevyn dressings as she does daily. Pt aware those types of dressings are to remain intact and changed every 3 days only for effectiveness in healing. Wounds cleansed and redressed. Pam Love PAC attempted to remove suture rt forearm as pt insistent she would " yank it out myself" if not addressed. One removed, one remains in place. No combativeness today, however did throw call bell onto floor several times. Incontinent urine this AM x1. Carlean Purl

## 2011-07-27 NOTE — Progress Notes (Signed)
Physical Therapy Session Note  Patient Details  Name: Litzy Joanmarie Tsang MRN: [id number] Date of Birth: [birth year]  Today's Date: 07/27/2011 Time: 1350-1415 Time Calculation (min): 25 min  Short Term Goals: Week 1:  PT Short Term Goal 1 (Week 1): Pt will perform static sitting balance EOB x 10 minutes with supervision PT Short Term Goal 2 (Week 1): Pt will perform bed mobility with max A PT Short Term Goal 3 (Week 1): Pt will perform bed <> w/c transfer with +2 pt 65%  Pt not progressing towards short or long term goals  Skilled Therapeutic Interventions/Progress Updates:    Pt "sleeping" when PT entered however continually blinked while keeping eyes closed through name call, sternal rub, arm shake, etc. Pt eventually responding, pt had limited attention span, performed supine to long sitting with +1 total assist, refused sitting EOB. Attempted basketball shoot to facilitate activity tolerance however pt repeatedly laid back down supine. Pt participating <10% of session, making sentences without sensible train of thought. Discussed pt's goals and desire to go home, discussed need for participation however pt still not participating in therapy. Pt missed 20 min of therapy secondary to refusal to participate.   Therapy Documentation Precautions:  Precautions Precautions: Fall Restrictions Weight Bearing Restrictions: Yes RUE Weight Bearing: Weight bearing as tolerated LUE Weight Bearing: Weight bearing as tolerated RLE Weight Bearing: Non weight bearing LLE Weight Bearing: Non weight bearing General: Amount of Missed PT Time (min): 20 Minutes Missed Time Reason: Patient unwilling/refused to participate without medical reason Vital Signs:   Pain:   Mobility:   Locomotion :    Trunk/Postural Assessment :    Balance:   Exercises:   Other Treatments:    See FIM for current functional status  Therapy/Group: Individual Therapy  [name] 07/27/2011, 2:20  PM

## 2011-07-27 NOTE — Progress Notes (Signed)
Dr. Ferol Luz called to update medical team on conference she had with patient and her fiancee about behavior. Patient has been on Depakote in the past which has helped with mood stabilization and she recommended that we add this to her regimen.  She also recommended premedicating with ativan 0.5mg  about 30 mins prior to therapy sessions to help with participation.  Nurse reports today is the first day patient has refused.  Will write for ativan with parameters for nurse to administer.

## 2011-07-27 NOTE — Progress Notes (Signed)
  Nutrition Follow-up  Nurse tech states pt is not appropriate to talk to today, RN reports pt is eating adequately. Pt is refusing Resource Breeze at this time but is drinking other juice drinks pt has in room.  Pt was upgraded to thin liquids.   PO intake variable 25-100% Diet Order:  Dysphagia 3 now thin liquids  Supplements: Breeze PO daily and MVI daily  Meds: Scheduled Meds:    . antiseptic oral rinse  15 mL Mouth Rinse QID  . bacitracin  1 application Topical BID  . benztropine  0.5 mg Oral BID  . cephALEXin  250 mg Oral Q8H  . DULoxetine  60 mg Oral Daily  . feeding supplement  1 Container Oral BID BM  . ferrous sulfate  325 mg Oral TID WC  . gabapentin  300 mg Oral TID  . HYDROcodone-acetaminophen  1 tablet Oral QID  . multivitamin with minerals  1 tablet Oral Daily  . OLANZapine  15 mg Oral Daily  . QUEtiapine  100 mg Oral Q2000  . senna-docusate  2 tablet Oral BID  . warfarin  10 mg Oral Custom  . warfarin  7.5 mg Oral Custom  . warfarin  7.5 mg Oral Custom  . Warfarin - Pharmacist Dosing Inpatient   Does not apply q1800  . DISCONTD: gabapentin  100 mg Oral BID  . DISCONTD: gabapentin  300 mg Oral QHS  . DISCONTD: QUEtiapine  200 mg Oral Q2000  . DISCONTD: warfarin  10 mg Oral Q Sun-1800   Continuous Infusions:  PRN Meds:.acetaminophen, albuterol, ALPRAZolam, food thickener, HYDROcodone-acetaminophen, ondansetron (ZOFRAN) IV, ondansetron, polyethylene glycol, sodium chloride, sorbitol  Labs:  CMP     Component Value Date/Time   NA 141 07/20/2011 0640   K 3.5 07/20/2011 0640   CL 103 07/20/2011 0640   CO2 25 07/20/2011 0640   GLUCOSE 87 07/20/2011 0640   BUN 15 07/20/2011 0640   CREATININE 0.68 07/20/2011 0640   CALCIUM 9.9 07/20/2011 0640   PROT 8.1 07/20/2011 0640   ALBUMIN 3.2* 07/20/2011 0640   AST 14 07/20/2011 0640   ALT 12 07/20/2011 0640   ALKPHOS 173* 07/20/2011 0640   BILITOT 0.4 07/20/2011 0640   GFRNONAA >90 07/20/2011 0640   GFRAA >90 07/20/2011 0640      Intake/Output Summary (Last 24 hours) at 07/27/11 1442 Last data filed at 07/27/11 1400  Gross per 24 hour  Intake    600 ml  Output      0 ml  Net    600 ml    Weight Status:  211 lbs, down from 228 lbs on admission   Estimated needs:   2100 - 2200 kcal, 100 - 115 grams protein, at least 2 liters daily  Nutrition Dx:  Inadequate oral intake r/t variable appetite and food preferences AEB PO intake of meals <50%.  Goal:  Pt to consume at least 75% of meals and supplements  Intervention:  1. D/c Resource Breeze as pt is not taking it 2. Encourage PO intake   Monitor:  Weights, labs, PO intake, I/O's  Clarene Duke RD, LDN Pager [phone]
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
## 2011-07-27 NOTE — Progress Notes (Signed)
Speech Language Pathology Weekly Progress & Session Note  Patient Details  Name: Tonya [name] MRN: [id number] Date of Birth: Nov 14, 1985  Today's Date: 07/27/2011 Time: 1191-4782 Time Calculation (min): 30 min  Skilled Therapeutic Intervention: Pt consumed trails of regular textures. Pt demonstrated efficient mastication but required Mod A verbal cues for redirection and to decrease verbosity during mastication of trials. Pt with minimal oral residue that cleared with a thin liquid wash. Pt did not demonstrate overt s/s of aspiration and diet was upgraded to regular textures.   Short Term Goals: Week 2: SLP Short Term Goal 1 (Week 2): Pt will utilize swallowing compensatory strategies with supervision verbal cues without overt s/s of aspiration.   Weekly Progress Updates: Pt has made functional gains and is currently consuming regular textures with thin liquids and requires Min A verbal cues to utilize small, single sips and appropriate positioning.  Pt's utilization of compensatory strategies is impacted by self distracting behaviors with giggling, and near constant verbalizations unrelated to task. Pt would benefit from continued skilled SLP services to maximize swallowing function with least restrictive diet and to maximize utilization of compensatory strategies.    SLP Frequency: 1-2 X/day, 30-60 minutes Estimated Length of Stay: 1 week SLP Treatment/Interventions: Therapeutic Activities;Patient/family education;Internal/external aids;Dysphagia/aspiration precaution training;Environmental controls  Daily Session FIM:  Comprehension Comprehension Mode: Auditory Comprehension: 4-Understands basic 75 - 89% of the time/requires cueing 10 - 24% of the time Expression Expression Mode: Verbal Expression: 5-Expresses basic needs/ideas: With extra time/assistive device Social Interaction Social Interaction: 2-Interacts appropriately 25 - 49% of time - Needs frequent  redirection. Problem Solving Problem Solving: 2-Solves basic 25 - 49% of the time - needs direction more than half the time to initiate, plan or complete simple activities Memory Memory: 2-Recognizes or recalls 25 - 49% of the time/requires cueing 51 - 75% of the time FIM - Eating Eating Activity: 5: Supervision/cues  Pain Pt reports intermittent pain throughout session.   Therapy/Group: Individual Therapy  Kaidance Pantoja 07/27/2011, 4:00 PM

## 2011-07-27 NOTE — Progress Notes (Addendum)
Physical Therapy Session Note  Patient Details  Name: Tonya [name] MRN: [id number] Date of Birth: [birth year]  Today's Date: 07/27/2011 Time: 4696-2952 Time Calculation (min): 75 min  Short Term Goals: Week 1:  PT Short Term Goal 1 (Week 1): Pt will perform static sitting balance EOB x 10 minutes with supervision PT Short Term Goal 2 (Week 1): Pt will perform bed mobility with max A PT Short Term Goal 3 (Week 1): Pt will perform bed <> w/c transfer with +2 pt 65%  Skilled Therapeutic Interventions/Progress Updates:   Session began relatively well with pt agreeable to get to [facility], bed mobility performed with +2 total assist max verbal cues, pt refuses 90% of sequencing cues or cues for efficiency. Pt transferred to [facility] with +2 total assist however was able to contribute 70% to task. Pt has to be verbally directed and cued to task 80% of time. With attempted transfer from [facility] to wheelchair pt refuses to use sliding board then had difficulty clearing buttocks over wheel and brake, a +2 assist became + 3 assist as pt within seconds lost emotional stability and even through therapists best efforts could not be calmed down. Pt refused all therapist cues for sequencing and safety. Pt throwing self forwards unsafely at times, when educated that she was at risk to fall in the floor pt states "I don't care." Pt inconsolable even with favorite nursing tech. Pt laughing repeatedly when she thought she had got urine on therapist, "that is so funny."  Unable to get pt's shorts completely pulled up, pt refused to communicate with therapist. Pt had to be left in chair with nursing.   From here on out recommend only sliding board transfers as pt unable to contribute enough to scoot pivot to be performed safely.   Words can not express how non participatory pt rapidly became, then resistive during session. Pt reporting she would like to punch therapists and even threw elbow at assistant during transfer.    Therapy Documentation Precautions:  Precautions Precautions: Fall Restrictions Weight Bearing Restrictions: Yes RUE Weight Bearing: Weight bearing as tolerated LUE Weight Bearing: Weight bearing as tolerated RLE Weight Bearing: Non weight bearing LLE Weight Bearing: Non weight bearing  See FIM for current functional status  Therapy/Group: Individual Therapy  [name] 07/27/2011, 12:34 PM

## 2011-07-27 NOTE — Progress Notes (Signed)
07/27/11 Patient alert x 3 at hs. Patient directing care and verbally abusive to staff. Patient incontinent of urine x 1. Physically combative with float nurse when she assisted with bed change. Significant other directing care and disrupting patients sleep. Tonya [name]
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
## 2011-07-28 DIAGNOSIS — IMO0002 Reserved for concepts with insufficient information to code with codable children: Secondary | ICD-10-CM

## 2011-07-28 DIAGNOSIS — S82109A Unspecified fracture of upper end of unspecified tibia, initial encounter for closed fracture: Secondary | ICD-10-CM

## 2011-07-28 DIAGNOSIS — Z9119 Patient's noncompliance with other medical treatment and regimen: Secondary | ICD-10-CM

## 2011-07-28 DIAGNOSIS — Z5189 Encounter for other specified aftercare: Secondary | ICD-10-CM

## 2011-07-28 DIAGNOSIS — S82839A Other fracture of upper and lower end of unspecified fibula, initial encounter for closed fracture: Secondary | ICD-10-CM

## 2011-07-28 DIAGNOSIS — F3110 Bipolar disorder, current episode manic without psychotic features, unspecified: Secondary | ICD-10-CM

## 2011-07-28 MED ORDER — WARFARIN SODIUM 7.5 MG PO TABS
7.5000 mg | ORAL_TABLET | ORAL | Status: AC
  Administered 2011-07-29: 7.5 mg via ORAL
  Filled 2011-07-28: qty 1

## 2011-07-28 MED ORDER — WARFARIN SODIUM 2.5 MG PO TABS
2.5000 mg | ORAL_TABLET | Freq: Once | ORAL | Status: AC
  Administered 2011-07-28: 2.5 mg via ORAL
  Filled 2011-07-28: qty 1

## 2011-07-28 MED ORDER — WARFARIN SODIUM 10 MG PO TABS: 10.0000 mg | ORAL_TABLET | ORAL | Status: DC

## 2011-07-28 NOTE — Progress Notes (Signed)
Physical Therapy Note  Patient Details  Name: Tonya [name] MRN: [id number] Date of Birth: [birth year] Today's Date: 07/28/2011,   11:00-11:50 individual therapy pt denied pain.  Pt performed wc mobility in room for home environment with supervision. Pt performed sliding board transfer to left with supervision and assist  And vc for wc set up including placement, brakes, armrest and leg rest. With sit to supine pt laied back across bed and needed mod assist to sit back up. Pt required min assist for left leg with vc to lay on her side. Pt able to scoot herself up in bed once in trendelenburg position. Performed UE yellow theraband shoulder/tricep extension and left LE heel slides.    Julian Reil 07/28/2011, 11:58 AM

## 2011-07-28 NOTE — Progress Notes (Signed)
Occupational Therapy Session Note  Patient Details  Name: Tonya [name] MRN: [id number] Date of Birth: 02-17-1985  Today's Date: 07/28/2011 Time: 1308-6578 Time Calculation (min): 45 min  Short Term Goals: Week 1:  All short term goals met as of 6/28, however, patient needs to show more consistent performance of tasks. OT Short Term Goal 1 (Week 1): Pt will perform basic transfers with max A +2 OT Short Term Goal 2 (Week Pt will perform grooming at the sink with mod I OT Short Term Goal 3 (Week 1): Pt will be able to setup meal with mod I OT Short Term Goal 4 (Week 1): Pt will don shirt with min A OT Short Term Goal 5 (Week 1): Pt will tolerate 30 min of funcitonal activity with only 3 rest breaks  Skilled Therapeutic Interventions/Progress Updates:    Patient seen for OT this am to address self dressing and hygiene.  Patient easier to redirect to task today.  Patient able to fololw commands, and accept feedback from staff.  Patient able to don tshirt and shorts in less than 10 minutes this am.  Patient transferred from bed to wheelchair using sliding board and minimal assistance.  Second caregiver present as patient's functional abilities have been varying between total assistance to mod assist.  Patient praised for her focused efforts this session.    Therapy Documentation Precautions:  Precautions Precautions: Fall Restrictions Weight Bearing Restrictions: Yes RUE Weight Bearing: Weight bearing as tolerated LUE Weight Bearing: Weight bearing as tolerated RLE Weight Bearing: Non weight bearing LLE Weight Bearing: Non weight bearing    Pain: Pain Assessment Pain Assessment: No/denies pain  See FIM for current functional status  Therapy/Group: Individual Therapy  Collier Salina 07/28/2011, 5:36 PM
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
## 2011-07-28 NOTE — Progress Notes (Signed)
PSYCHOLOGY  Name:  Tonya [name]  MRN:   [id number]  Date of Birth:  12-05-1985 Date:   07/28/11  Met with her at bedside. Her fiance was present. She appeared less manic with better attention span and thought organization. Her speech was still pressured in rate. No overt signs of emotional distress. She denied suicidal/homicidal ideation as well as psychotic symptoms.  We were able to have a reasonable conversation today about her psychiatric status and behavior on the rehab unit. She expressed better insight into her mood disorder and need to take mood-stabilizing medications as directed. She expressed some awareness of her inappropriate argumentativeness and abusive behavior towards staff, which she attributed to anticipating pain and "trust issues". When asked, she did not acknowledge feeling emotional distress or apprehension about her limb loss.  We discussed ways of coping with her inner tension that does not involve inappropriate acting out, which in the end only slows her rehab recovery. She seemed receptive to this but still deflected personal responsibility for her behavior.  Met with Tonya [name] (LCSW) and Tonya [name] (CM) to discuss discharge planning. Tonya [name] currently does not have a residence to go to from here. She will clearly need follow-up mental health services. If she continues to comply with medications and the meds continue to be effective, it is likely that her psychiatric status can be stabilized here. If so, she can later be enrolled in an intensive outpatient mental health program. I will defer decisions about mental health discharge planning to Tonya [name], her psychiatrist.  In my opinion, as of today she possesses sufficient mental capacity to make decisions in her own interests. This could change should her psychiatric condition deteriorate.  Will continue to follow while on rehab unit.   Tonya [name], Ph.D

## 2011-07-28 NOTE — Progress Notes (Signed)
Pt transferred with nursing from bed to wheelchair and bedside commode several times during shift, pt had medium BM on commode, RN accompanied pt outside for fresh air, pt very appropriate today, pt took all medications without difficulty, pt remained continent during shift.
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
## 2011-07-28 NOTE — Progress Notes (Signed)
Speech Language Pathology Daily Session Note  Patient Details  Name: Tonya [name] MRN: [id number] Date of Birth: Aug 06, 1985  Today's Date: 07/28/2011 Time: 3244-0102 Time Calculation (min): 30 min  Short Term Goals: Week 2: SLP Short Term Goal 1 (Week 2): Pt will utilize swallowing compensatory strategies with supervision verbal cues without overt s/s of aspiration.   Skilled Therapeutic Interventions: Pt up in chair during session. Pt consumed regular textures and thin liquids and required supervision verbal cues to utilize small, single sips via straw. Pt without overt s/s of aspiration and demonstrated efficient mastication.    FIM:  Comprehension Comprehension Mode: Auditory Comprehension: 5-Understands basic 90% of the time/requires cueing < 10% of the time Expression Expression: 4-Expresses basic 75 - 89% of the time/requires cueing 10 - 24% of the time. Needs helper to occlude trach/needs to repeat words. Social Interaction Social Interaction: 3-Interacts appropriately 50 - 74% of the time - May be physically or verbally inappropriate. Problem Solving Problem Solving: 2-Solves basic 25 - 49% of the time - needs direction more than half the time to initiate, plan or complete simple activities Memory Memory: 4-Recognizes or recalls 75 - 89% of the time/requires cueing 10 - 24% of the time FIM - Eating Eating Activity: 5: Supervision/cues  Pain Pain Assessment Pain Assessment: No/denies pain  Therapy/Group: Individual Therapy  Tonya [name] 07/28/2011, 4:05 PM

## 2011-07-28 NOTE — Progress Notes (Signed)
Social Work Patient ID: Tonya [name], female   DOB:  [birth year], 26 y.o.   MRN: [id number]  Met this morning with pt, fiance, and his cousin/ wife to discuss d/c plans.  Initially, had met privately with fiance's cousin and his wife to discuss fiance's report that those two would be helping him in providing 24/7 assistance to pt at d/c.  They very clearly report that they are unable and unwilling to provide care to pt.  They feel that her care needs, both mentally and physically, are beyond their capabilities.  They also report that their home cannot accommodate pt and her fiance and that they have no other home to d/c to.   We then added pt's fiance to discussion and he is aware of and agreeable with cousin's/ wife's concerns.  All three are aware that, without a d/c location or plan for 24/7, then we must pursue placement of pt at another facility.  I have explained to all that I am not sure what level/ type of facility is most appropriate for pt i.e. SNF vs IP Psychiatric Facility and that I will discuss pt's care needs with psychiatry and with psych service SW here at [facility] to aide in making decision about LOC.   We then all met with patient - RN, CNA and RN CM also present - reviewed all above info with patient  which she received very calmly. She states understanding that I will work with other team members to determine placement, but will continue to keep her in discussion throughout this process.  Pt agreeable with this and aware that originally targeted d/c date has been cancelled.  [name]

## 2011-07-28 NOTE — Care Management (Signed)
Per State Regulation 482.30 This chart was reviewed for medical necessity with respect to the patient's Admission/Duration of stay. Khaden Gater S                 Nurse Care Manager             Next Review Date: 08/01/11   
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
## 2011-07-28 NOTE — Progress Notes (Addendum)
ANTICOAGULATION CONSULT NOTE - Follow Up Consult  Pharmacy Consult for Coumadin Indication: VTE prophylaxis  No Known Allergies  Patient Measurements: Height: 5\' 5"  (165.1 cm) Weight: 220 lb 3.8 oz (99.9 kg) IBW/kg (Calculated) : 57   Vital Signs: Temp: 98.2 F (36.8 C) (06/28 0700) Temp src: Oral (06/28 0700) BP: 116/76 mmHg (06/28 0700) Pulse Rate: 89  (06/28 0700)  Labs:  [name] 07/28/11 0709 07/27/11 0620 07/26/11 0625  HGB -- -- --  HCT -- -- --  PLT -- -- --  APTT -- -- --  LABPROT 30.1* 24.7* 26.1*  INR 2.82* 2.19* 2.35*  HEPARINUNFRC -- -- --  CREATININE -- -- --  CKTOTAL -- -- --  CKMB -- -- --  TROPONINI -- -- --    Estimated Creatinine Clearance: 124.8 ml/min (by C-G formula based on Cr of 0.68).   Medications:  Scheduled:    . antiseptic oral rinse  15 mL Mouth Rinse QID  . bacitracin  1 application Topical BID  . benztropine  0.5 mg Oral BID  . cephALEXin  250 mg Oral Q8H  . DULoxetine  60 mg Oral Daily  . ferrous sulfate  325 mg Oral TID WC  . gabapentin  300 mg Oral TID  . HYDROcodone-acetaminophen  1 tablet Oral QID  . multivitamin with minerals  1 tablet Oral Daily  . OLANZapine  15 mg Oral Daily  . QUEtiapine  100 mg Oral Q2000  . senna-docusate  2 tablet Oral BID  . warfarin  10 mg Oral Custom  . warfarin  7.5 mg Oral Custom  . Warfarin - Pharmacist Dosing Inpatient   Does not apply q1800  . white petrolatum      . DISCONTD: divalproex  500 mg Oral Q12H  . DISCONTD: feeding supplement  1 Container Oral BID BM  . DISCONTD: LORazepam  0.5 mg Oral TID  . DISCONTD: warfarin  10 mg Oral Q Sun-1800    Assessment: 26yo female on Coumadin for VTE px s/p multiple orthopedic surgeries.  INR with large jump this AM, question the validity of yesterday's 2.19, as it seems overall would have been an upward trend otherwise. No bleeding problems noted.  Goal of Therapy:  INR 2-3 Monitor platelets by anticoagulation protocol: Yes   Plan:  1.   Change to Coumadin 7.5mg  daily, except 10mg  on Thurs 2.  Continue daily INR 3.  Give Coumadin 2.5mg  today  [name], PharmD Clinical Pharmacist [facility]

## 2011-07-28 NOTE — Progress Notes (Signed)
07/28/11 Patient angry with staff at hs for not getting her up at 2200 to wheel around in wheelchair on unit. Explain to patient that it is not safe getting her up if she does not want to use slide board or transfer from bed properly. Patient declined to use slide board as recommended by therapy. As a result, patient voided and had incontinent BM in bed. Patient uncooperative when attempting to clean up patient x 2. Patient remained still and would not turn for staff. Educated patient on behavior. Patient's behavior improved after coloring utensils were brought in by staff to distract. patient.  Harland Dingwall, LPN
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
## 2011-07-29 DIAGNOSIS — S82839A Other fracture of upper and lower end of unspecified fibula, initial encounter for closed fracture: Secondary | ICD-10-CM

## 2011-07-29 DIAGNOSIS — S82109A Unspecified fracture of upper end of unspecified tibia, initial encounter for closed fracture: Secondary | ICD-10-CM

## 2011-07-29 DIAGNOSIS — Z5189 Encounter for other specified aftercare: Secondary | ICD-10-CM

## 2011-07-29 DIAGNOSIS — F339 Major depressive disorder, recurrent, unspecified: Secondary | ICD-10-CM

## 2011-07-29 DIAGNOSIS — IMO0002 Reserved for concepts with insufficient information to code with codable children: Secondary | ICD-10-CM

## 2011-07-29 LAB — PROTIME-INR: Prothrombin Time: 28.3 seconds — ABNORMAL HIGH (ref 11.6–15.2)

## 2011-07-29 NOTE — Plan of Care (Signed)
Problem: RH SKIN INTEGRITY Goal: RH STG ABLE TO PERFORM INCISION/WOUND CARE W/ASSISTANCE STG Able To Perform Incision/Wound Care With [employer].  Outcome: Not Progressing rn performs wound dressing
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
## 2011-07-29 NOTE — Progress Notes (Signed)
ANTICOAGULATION CONSULT NOTE - Follow Up Consult  Pharmacy Consult for Coumadin Indication: VTE prophylaxis  No Known Allergies  Patient Measurements: Height: 5\' 5"  (165.1 cm) Weight: 220 lb 3.8 oz (99.9 kg) IBW/kg (Calculated) : 57  Heparin Dosing Weight:   Vital Signs: Temp: 99.9 F (37.7 C) (06/29 0500) Temp src: Oral (06/29 0500) BP: 124/76 mmHg (06/29 0500) Pulse Rate: 101  (06/29 0500)  Labs:  Basename 07/29/11 0500 07/28/11 0709 07/27/11 0620  HGB -- -- --  HCT -- -- --  PLT -- -- --  APTT -- -- --  LABPROT 28.3* 30.1* 24.7*  INR 2.60* 2.82* 2.19*  HEPARINUNFRC -- -- --  CREATININE -- -- --  CKTOTAL -- -- --  CKMB -- -- --  TROPONINI -- -- --    Estimated Creatinine Clearance: 124.8 ml/min (by C-G formula based on Cr of 0.68).   Medications:  Scheduled:    . antiseptic oral rinse  15 mL Mouth Rinse QID  . bacitracin  1 application Topical BID  . benztropine  0.5 mg Oral BID  . cephALEXin  250 mg Oral Q8H  . DULoxetine  60 mg Oral Daily  . ferrous sulfate  325 mg Oral TID WC  . gabapentin  300 mg Oral TID  . HYDROcodone-acetaminophen  1 tablet Oral QID  . multivitamin with minerals  1 tablet Oral Daily  . OLANZapine  15 mg Oral Daily  . QUEtiapine  100 mg Oral Q2000  . senna-docusate  2 tablet Oral BID  . warfarin  10 mg Oral Custom  . warfarin  2.5 mg Oral ONCE-1800  . warfarin  7.5 mg Oral Custom  . Warfarin - Pharmacist Dosing Inpatient   Does not apply q1800  . DISCONTD: warfarin  10 mg Oral Custom  . DISCONTD: warfarin  7.5 mg Oral Custom    Assessment: 25yo female s/p multiple ortho surgeries, on Coumadin for VTE px.  INR 2.6 this AM after smaller dose yesterday accomodate large jump in INR this week.  No bleeding noted.  Goal of Therapy:  INR 2-3 Monitor platelets by anticoagulation protocol: Yes   Plan:  1.  Continue Coumadin 7.5mg  daily, except 10mg  on Thurs 2.  Change INR to MWF only to limit lab draws.  [name],  PharmD Clinical Pharmacist [facility]

## 2011-07-29 NOTE — Progress Notes (Addendum)
Occupational Therapy Note  Patient Details  Name: Tonya [name] MRN: [id number] Date of Birth: Aug 03, 1985 Today's Date: 07/29/2011 Time:  0900-1000  (60 min) Individual Therapy Pain:  None Pt. Had already bathed and dressed.  She stated she just needed minimal assist this am with her self care.   Engaged in wc mobility, UE strengthening, UE coordination, and quick responses with BUE.  Pt. Was independent in wc on the level surface.  Performed ball toss on the ball rebounder, football toss, volleyball.  Pt able to bounce 15-20 times on rebounder with one hand before losing the ball.  Did quick responses, eye hand coordination with the Wii table tennis and hockey.  Pt had more difficulty holding her arm steady and performing accurate hits.     Humberto Seals 07/29/2011, 10:01 AM
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
## 2011-07-30 LAB — PROTIME-INR: INR: 2.37 — ABNORMAL HIGH (ref 0.00–1.49)
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
## 2011-07-30 NOTE — Progress Notes (Signed)
Occupational Therapy Session Note  Patient Details  Name: Joannie Marnesha Gagen MRN: [id number] Date of Birth: 02/17/1985  Today's Date: 07/30/2011 Time:  - 0945-1030  (45 min)   Short Term Goals: Week 1:  OT Short Term Goal 1 (Week 1): Pt will perform basic transfers with max A +2  OT Short Term Goal 2 (Week 1): Pt will perform grooming at the sink mod I OT Short Term Goal 3 (Week 1): Pt will be able to setup meal with mod I OT Short Term Goal 4 (Week 1): Pt will don shirt with min A OT Short Term Goal 5 (Week 1): Pt will tolerate 30 min of funcitonal activity with only 3 rest breaks  Skilled Therapeutic Interventions/Progress Updates:    Pt. Sitting in wc with clothes out on bed.  Pt folded clothes and put in chest of drawers.  Toilet transfer to [facility] with min a plus sliding board; needed mod assist with clothing manipulation and  doing lateral leans.   Propelled wc to kitchen to engage in reaching in cabinets and becoming familiar with kitchen for cooking session on Monday.      Therapy Documentation Precautions:  Precautions Precautions: Fall Restrictions Weight Bearing Restrictions: Yes RUE Weight Bearing: Weight bearing as tolerated LUE Weight Bearing: Weight bearing as tolerated RLE Weight Bearing: Non weight bearing LLE Weight Bearing: Non weight bearing    Pain:  none  See FIM for current functional status  Therapy/Group: Individual Therapy  Humberto Seals 07/30/2011, 10:09 AM
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
## 2011-07-31 LAB — PROTIME-INR
INR: 1.95 — ABNORMAL HIGH (ref 0.00–1.49)
Prothrombin Time: 22.6 seconds — ABNORMAL HIGH (ref 11.6–15.2)

## 2011-07-31 MED ORDER — WARFARIN SODIUM 7.5 MG PO TABS
7.5000 mg | ORAL_TABLET | Freq: Once | ORAL | Status: AC
  Administered 2011-07-31: 7.5 mg via ORAL
  Filled 2011-07-31: qty 1

## 2011-07-31 MED ORDER — GABAPENTIN 300 MG PO CAPS
300.0000 mg | ORAL_CAPSULE | Freq: Two times a day (BID) | ORAL | Status: DC
  Administered 2011-07-31 – 2011-08-18 (×37): 300 mg via ORAL
  Filled 2011-07-31 (×38): qty 1

## 2011-07-31 MED ORDER — GABAPENTIN 600 MG PO TABS
600.0000 mg | ORAL_TABLET | Freq: Every day | ORAL | Status: DC
  Administered 2011-07-31 – 2011-08-17 (×18): 600 mg via ORAL
  Filled 2011-07-31 (×19): qty 1

## 2011-07-31 NOTE — Progress Notes (Signed)
Occupational Therapy Weekly Progress Note  Patient Details  Name: Tonya [name] MRN: [id number] Date of Birth: 05-23-1985  Today's Date: 07/31/2011 Time: 0915-1000 Time Calculation (min): 45 min  Patient has met 5 of 5 short term goals.    OT Short Term Goals Week 1:   OT Short Term Goal 1 (Week 1): Pt will perform basic transfers with max A +2  OT Short Term Goal 1 - Progress (Week 1): Met OT Short Term Goal 2 (Week 1): Pt will perform grooming at the sink mod I OT Short Term Goal 2 - Progress (Week 1): Met OT Short Term Goal 3 (Week 1): Pt will be able to setup meal with mod I OT Short Term Goal 3 - Progress (Week 1): Met OT Short Term Goal 4 (Week 1): Pt will don shirt with min A OT Short Term Goal 4 - Progress (Week 1): Met OT Short Term Goal 5 (Week 1): Pt will tolerate 30 min of funcitonal activity with only 3 rest breaks OT Short Term Goal 5 - Progress (Week 1): Met Week 2:   OT Short Term Goal 1 (Week 2): STG = Unmet LTGs  Patientts d/c plan home to fiancee's cousins home is no longer an option.  SW working to determine most appropriate d/c setting for pt (SNF vs  inpatient psych center).  Initially, patient not been fully cooperative with therapies, at time manic, anxious, aggressive, abusive towards staff. Last few days, patient has made great improvements and working well with therapy. Psychiatry is involved and working on medications so pt can stabilize her mood and behavior and obtain full benefit from the rehab program.  Patient continues to demonstrate the following deficits:pain, LE weakness/pain and UE weakness/pain in hands, decreased balance, and therefore will continue to benefit from skilled OT intervention to enhance overall performance with activity tolerance, balance, ability to compensate for deficits, functional use of  right upper extremity, right lower extremity, left upper extremity and left lower extremity, BADL, IADL, reduce care partner burden, and  knowledge of precautions.   Patient progressing toward long term goals.  Continue plan of care.  Skilled Therapeutic Interventions/Progress Updates:  Prior to OT session, NT set up patient to perform her bath from bed level.  Self care retraining to include toileting, toilet and W/C sliding board transfers, dressing and grooming from W/C level.  Focus session on need to stay on task so complete session in timely manner (which patient did accomplish), slide board transfers when patient has bare bottom->using bed pad and lateral leans to place as well as remove bed pad, dressing from W/C, washed hair with assistance at sink.    Precautions:  Precautions Precautions: Fall Restrictions Weight Bearing Restrictions: Yes RUE Weight Bearing: Weight bearing as tolerated LUE Weight Bearing: Weight bearing as tolerated RLE Weight Bearing: Non weight bearing LLE Weight Bearing: Non weight bearing Pain: Pain Assessment Pain Assessment: 0-10 Pain Score:   7 Pain Type: Acute pain Pain Location: Leg Pain Orientation: Left;Right Pain Descriptors: Aching Pain Frequency: Intermittent Pain Onset: On-going Patients Stated Pain Goal: 2 Pain Intervention(s): Medication (See eMAR) (scheduled vicodin)  See FIM for current functional status  Therapy/Group: Individual Therapy and fiancee present to observe  Markel Kurtenbach 07/31/2011, 11:28 AM

## 2011-07-31 NOTE — Progress Notes (Signed)
ANTICOAGULATION CONSULT NOTE - Follow Up Consult  Pharmacy Consult for Coumadin Indication: VTE prophylaxis  No Known Allergies  Patient Measurements: Height: 5\' 5"  (165.1 cm) Weight: 220 lb 3.8 oz (99.9 kg) IBW/kg (Calculated) : 57   Vital Signs: Temp: 98.4 F (36.9 C) (07/01 0500) Temp src: Oral (07/01 0500) BP: 136/84 mmHg (07/01 0500) Pulse Rate: 99  (07/01 0500)  Labs:  Basename 07/31/11 0620 07/30/11 0643 07/29/11 0500  HGB -- -- --  HCT -- -- --  PLT -- -- --  APTT -- -- --  LABPROT 22.6* 26.3* 28.3*  INR 1.95* 2.37* 2.60*  HEPARINUNFRC -- -- --  CREATININE -- -- --  CKTOTAL -- -- --  CKMB -- -- --  TROPONINI -- -- --    Estimated Creatinine Clearance: 124.8 ml/min (by C-G formula based on Cr of 0.68).   Medications:  Scheduled:     . antiseptic oral rinse  15 mL Mouth Rinse QID  . bacitracin  1 application Topical BID  . DULoxetine  60 mg Oral Daily  . ferrous sulfate  325 mg Oral TID WC  . gabapentin  300 mg Oral BID  . gabapentin  600 mg Oral QHS  . HYDROcodone-acetaminophen  1 tablet Oral QID  . multivitamin with minerals  1 tablet Oral Daily  . OLANZapine  15 mg Oral Daily  . QUEtiapine  100 mg Oral Q2000  . senna-docusate  2 tablet Oral BID  . warfarin  10 mg Oral Custom  . Warfarin - Pharmacist Dosing Inpatient   Does not apply q1800  . DISCONTD: gabapentin  300 mg Oral TID    Assessment: 26yo female s/p multiple ortho surgeries, on Coumadin for VTE px.  INR subtherapeutic this morning after no dose was ordered yesterday. No bleeding noted.  Goal of Therapy:  INR 2-3 Monitor platelets by anticoagulation protocol: Yes   Plan:  Coumadin 7.5mg  today and f/u daily protime.  Verlene Mayer, PharmD, BCPS Pager [phone]

## 2011-07-31 NOTE — Progress Notes (Signed)
Physical Therapy Session Note  Patient Details  Name: Tonya [name] MRN: [id number] Date of Birth: 1985-12-15  Today's Date: 07/31/2011 Time: 4098-1191 Time Calculation (min): 55 min  Short Term Goals: Week 2:  PT Short Term Goal 1 (Week 2): STG = LTG's  Skilled Therapeutic Interventions/Progress Updates:    Sliding board transfers bed > wheelchair> mat (in gym) >wheelchair all performed with supervision. Pt occasionally desires to have therapist hold corner of board initially. Verbal cues for safe placement of board and efficiency. All transfers performed to level surfaces. AKA exercises initiated, pt provided with handout. Pt performed glute sets, isometric hip extension, and side lying Rt. Hip extension 2 x 12 reps each. Pt given handout for advanced exercises, she only wanted to perform partial sit ups today, 2 x 10 reps. Pt performing wheelchair mobility on unit with supervision. Reeducated pt on importance of AKA positioning and need to prevent hip contractures. Quick release belt donned on pt in wheelchair at end of session although pt feels she does not need it.   Therapy Documentation Precautions:  Precautions Precautions: Fall Restrictions Weight Bearing Restrictions: Yes RUE Weight Bearing: Weight bearing as tolerated LUE Weight Bearing: Weight bearing as tolerated RLE Weight Bearing: Non weight bearing LLE Weight Bearing: Non weight bearing Pain: Pain Assessment Pain Assessment: 0-10 Pain Score:   5 Pain Type: Acute pain Pain Location: Generalized Pain Descriptors: Aching Pain Frequency: Intermittent Pain Onset: On-going Patients Stated Pain Goal: 2 Pain Intervention(s): Medication (See eMAR) (scheduled vicodin)  See FIM for current functional status  Therapy/Group: Individual Therapy  [name] 07/31/2011, 5:37 PM

## 2011-07-31 NOTE — Progress Notes (Signed)
Speech Language Pathology Daily Session Note  Patient Details  Name: Tonya [name] MRN: [id number] Date of Birth: 12/19/1985  Today's Date: 07/31/2011 Time: 1035-1100 Time Calculation (min): 25 min  Short Term Goals: Week 2: SLP Short Term Goal 1 (Week 2): Pt will utilize swallowing compensatory strategies with supervision verbal cues without overt s/s of aspiration.   Skilled Therapeutic Interventions: Pt up in chair during session. Pt consumed thin liquids and required supervision verbal cues to utilize small, single sips via straw. Pt without overt s/s of aspiration.    FIM:  Comprehension Comprehension Mode: Auditory Comprehension: 5-Follows basic conversation/direction: With extra time/assistive device Expression Expression Mode: Verbal Expression: 5-Expresses basic needs/ideas: With extra time/assistive device Social Interaction Social Interaction: 5-Interacts appropriately 90% of the time - Needs monitoring or encouragement for participation or interaction. Problem Solving Problem Solving: 5-Solves basic 90% of the time/requires cueing < 10% of the time Memory Memory: 5-Recognizes or recalls 90% of the time/requires cueing < 10% of the time FIM - Eating Eating Activity: 5: Supervision/cues  Pain Pain Assessment Pain Score:   4  Therapy/Group: Individual Therapy  Kristin Barcus 07/31/2011, 3:53 PM

## 2011-07-31 NOTE — Progress Notes (Signed)
Physical Therapy Session Note  Patient Details  Name: Tonya [name] MRN: [id number] Date of Birth: 1985/02/19  Today's Date: 07/31/2011 Time: 1100-1146 Time Calculation (min): 46 min  Short Term Goals: Week 2:  PT Short Term Goal 1 (Week 2): STG = LTG's  Skilled Therapeutic Interventions/Progress Updates:    Pt desiring to explore the hospital. Practiced wheelchair mobility >1000' over variety of surfaces (tile, carpeted, brick, uneven pavement), ascending and descending inclines. Pt requires min assist to ascend step incline, otherwise she is supervision level with cues for efficiency and safety. Verbal cues for weight shift to clear door seals and for ease of mobility with incline/decline. Pt practiced negotiation of variety of doors. Pt had a few collisions with walls however was able to self correct. Educated pt on use of towel to perform desensitization exercises for Rt. Residual limb, demonstrated technique however pt declines to perform exercises at this time.   Pt missed 14 min of therapy as she wanted to spend time with her fiance alone, refused to finish session.   Therapy Documentation Precautions:  Precautions Precautions: Fall Restrictions Weight Bearing Restrictions: Yes RUE Weight Bearing: Weight bearing as tolerated LUE Weight Bearing: Weight bearing as tolerated RLE Weight Bearing: Non weight bearing LLE Weight Bearing: Non weight bearing General: Amount of Missed PT Time (min): 14 Minutes Missed Time Reason: Patient unwilling/refused to participate without medical reason Pain: Pain Assessment Pain Assessment: 0-10 Pain Score:   7 Pain Type: Acute pain Pain Location: Generalized (Arms, legs) Pain Orientation: Left;Right Pain Descriptors: Aching Pain Frequency: Intermittent Pain Onset: On-going Patients Stated Pain Goal: 2 Pain Intervention(s): Repositioned (Pt verbalized to RN) Locomotion : Wheelchair Mobility Distance: >1000'   See FIM for  current functional status  Therapy/Group: Individual Therapy  [name] 07/31/2011, 12:07 PM
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
## 2011-08-01 DIAGNOSIS — F3110 Bipolar disorder, current episode manic without psychotic features, unspecified: Secondary | ICD-10-CM

## 2011-08-01 LAB — PROTIME-INR
INR: 1.62 — ABNORMAL HIGH (ref 0.00–1.49)
Prothrombin Time: 19.5 seconds — ABNORMAL HIGH (ref 11.6–15.2)

## 2011-08-01 MED ORDER — SALINE SPRAY 0.65 % NA SOLN: 2.0000 | NASAL | Status: DC | PRN

## 2011-08-01 MED ORDER — WARFARIN SODIUM 7.5 MG PO TABS
7.5000 mg | ORAL_TABLET | Freq: Once | ORAL | Status: AC
  Administered 2011-08-01: 7.5 mg via ORAL
  Filled 2011-08-01: qty 1
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
## 2011-08-01 NOTE — Progress Notes (Signed)
ANTICOAGULATION CONSULT NOTE - Follow Up Consult  Pharmacy Consult for Coumadin Indication: VTE prophylaxis  No Known Allergies  Patient Measurements: Height: 5\' 5"  (165.1 cm) Weight: 235 lb 10.8 oz (106.9 kg) IBW/kg (Calculated) : 57   Vital Signs: Temp: 98.2 F (36.8 C) (07/02 0500) Temp src: Oral (07/02 0500) BP: 112/76 mmHg (07/02 0500) Pulse Rate: 85  (07/02 0500)  Labs:  [name] 08/01/11 4098 07/31/11 0620 07/30/11 0643  HGB -- -- --  HCT -- -- --  PLT -- -- --  APTT -- -- --  LABPROT 19.5* 22.6* 26.3*  INR 1.62* 1.95* 2.37*  HEPARINUNFRC -- -- --  CREATININE -- -- --  CKTOTAL -- -- --  CKMB -- -- --  TROPONINI -- -- --    Estimated Creatinine Clearance: 129.5 ml/min (by C-G formula based on Cr of 0.68).   Medications:  Scheduled:     . antiseptic oral rinse  15 mL Mouth Rinse QID  . bacitracin  1 application Topical BID  . DULoxetine  60 mg Oral Daily  . ferrous sulfate  325 mg Oral TID WC  . gabapentin  300 mg Oral BID  . gabapentin  600 mg Oral QHS  . HYDROcodone-acetaminophen  1 tablet Oral QID  . multivitamin with minerals  1 tablet Oral Daily  . OLANZapine  15 mg Oral Daily  . QUEtiapine  100 mg Oral Q2000  . senna-docusate  2 tablet Oral BID  . warfarin  7.5 mg Oral ONCE-1800  . Warfarin - Pharmacist Dosing Inpatient   Does not apply q1800  . DISCONTD: warfarin  10 mg Oral Custom    Assessment: 26yo female s/p multiple ortho surgeries, on Coumadin for VTE px.  INR subtherapeutic this morning after no dose was ordered on 6/30. Anticipate INR increase tomorrow. No bleeding noted.  Goal of Therapy:  INR 2-3 Monitor platelets by anticoagulation protocol: Yes   Plan:  Repeat Coumadin 7.5mg  today and f/u daily protime.  Verlene Mayer, PharmD, BCPS Pager [phone]

## 2011-08-01 NOTE — Progress Notes (Signed)
Nutrition Follow-up  Intervention:   1. Continue current interventions, pt is progressing well; RD to continue to follow nutrition care plan  Assessment:   RN and nurse tech report that pt's intake overall has improved, consistent with documented intake of mostly 90-100%. Pt states that she has no complaints with meals or appetite at this time.  Diet Order:  Regular Supplements: MVI daily  Meds: Scheduled Meds:   . antiseptic oral rinse  15 mL Mouth Rinse QID  . bacitracin  1 application Topical BID  . DULoxetine  60 mg Oral Daily  . ferrous sulfate  325 mg Oral TID WC  . gabapentin  300 mg Oral BID  . gabapentin  600 mg Oral QHS  . HYDROcodone-acetaminophen  1 tablet Oral QID  . multivitamin with minerals  1 tablet Oral Daily  . OLANZapine  15 mg Oral Daily  . QUEtiapine  100 mg Oral Q2000  . senna-docusate  2 tablet Oral BID  . warfarin  7.5 mg Oral ONCE-1800  . warfarin  7.5 mg Oral ONCE-1800  . Warfarin - Pharmacist Dosing Inpatient   Does not apply q1800   Continuous Infusions:  PRN Meds:.acetaminophen, albuterol, food thickener, HYDROcodone-acetaminophen, LORazepam, ondansetron (ZOFRAN) IV, ondansetron, polyethylene glycol, sodium chloride, sorbitol, DISCONTD: sodium chloride  Labs:  CMP     Component Value Date/Time   NA 141 07/20/2011 0640   K 3.5 07/20/2011 0640   CL 103 07/20/2011 0640   CO2 25 07/20/2011 0640   GLUCOSE 87 07/20/2011 0640   BUN 15 07/20/2011 0640   CREATININE 0.68 07/20/2011 0640   CALCIUM 9.9 07/20/2011 0640   PROT 8.1 07/20/2011 0640   ALBUMIN 3.2* 07/20/2011 0640   AST 14 07/20/2011 0640   ALT 12 07/20/2011 0640   ALKPHOS 173* 07/20/2011 0640   BILITOT 0.4 07/20/2011 0640   GFRNONAA >90 07/20/2011 0640   GFRAA >90 07/20/2011 0640     Intake/Output Summary (Last 24 hours) at 08/01/11 1251 Last data filed at 08/01/11 0700  Gross per 24 hour  Intake    840 ml  Output      4 ml  Net    836 ml    Weight Status:  235 lb, up from 211  lb  Re-estimated needs:  2100 - 2200 kcal, 100 - 115 grams protein, at least 2 liters daily  Nutrition Dx:  Inadequate oral intake r/t variable appetite and food preferences AEB PO intake of meals <50%. Resolved.  Goal:  Pt to consume at least 75% of meals. Met.  Monitor:  Weights, labs, PO intake, I/O's  Jarold Motto MS, RD, LDN Pager: [phone] After-hours pager: [phone]

## 2011-08-01 NOTE — Progress Notes (Signed)
Physical Therapy Session Note  Patient Details  Name: Tonya [name] MRN: [id number] Date of Birth: 1985-11-22  Today's Date: 08/01/2011 Time: 1138-1208 Time Calculation (min): 30 min  Short Term Goals: Week 2:  PT Short Term Goal 1 (Week 2): STG = LTG's  Skilled Therapeutic Interventions/Progress Updates:    All sliding board transfers (wheelchair> elevated mat>wheelchair>bed) performed with supervision. Pt needs equipment set up and assist but no physical assist needed. Pt needs moderate encouragement to participate in exercises - progressed HEP today. Performed resisted UE diagonals with orange band in seated position 2 x 9 reps. Prone stretch of hip flexors. Rt. Hip extension in prone position (2 x 8 reps).   Therapy Documentation Precautions:  Precautions Precautions: Fall Restrictions Weight Bearing Restrictions: Yes RUE Weight Bearing: Weight bearing as tolerated LUE Weight Bearing: Weight bearing as tolerated RLE Weight Bearing: Non weight bearing LLE Weight Bearing: Non weight bearing Pain: Pain Assessment Pain Assessment: 0-10 Pain Score:   4 Pain Type: Phantom pain Pain Location: Leg Pain Orientation: Right Pain Descriptors: Sharp Pain Onset: Sudden Pain Intervention(s): Repositioned Locomotion : Wheelchair Mobility Distance: 150' with supervision, for strength and endurance   Therapy/Group: Individual Therapy  [name] 08/01/2011, 12:22 PM

## 2011-08-01 NOTE — Progress Notes (Signed)
Physical Therapy Session Note  Patient Details  Name: Tonya [name] MRN: [id number] Date of Birth: [birth year]  Today's Date: 08/01/2011 Time: 8295-6213 Time Calculation (min): 42 min  Short Term Goals: Week 2:  PT Short Term Goal 1 (Week 2): STG = LTG's  Skilled Therapeutic Interventions/Progress Updates:    Pt declined exercising in gym today, requested wheelchair mobility outside. Pt performing wheelchair mobility over controlled environment, carpeted environment, uneven terrain, elevators, and steep incline with supervision. Distance totals >1000' and performed both for negotiation skills as well as for strength and conditioning. Performed resisted bil. UE rows, external rotation, and diagonal pulls 2 x 10 reps each (with orange band) for shoulder/core strengthening while outside. Wheelchair pushups for strengthening and improved efficiency with sliding board transfers x 10 reps. Pt encouraged to perform lower extremity HEP once in bed this evening, pt agrees to this. Pt's fiance present for session  Therapy Documentation Precautions:  Precautions Precautions: Fall Restrictions Weight Bearing Restrictions: Yes RUE Weight Bearing: Weight bearing as tolerated LUE Weight Bearing: Weight bearing as tolerated RLE Weight Bearing: Non weight bearing LLE Weight Bearing: Non weight bearing Pain: Pain Assessment Pain Assessment: 0-10 Pain Score:   6 Pain Type: Acute pain Pain Location: Leg Pain Orientation: Right Pain Descriptors: phantom pain Pain Intervention(s): repositioned, rest Multiple Pain Sites: No Locomotion : Wheelchair Mobility Distance: >1000'   See FIM for current functional status  Therapy/Group: Individual Therapy  [name] 08/01/2011, 5:31 PM

## 2011-08-01 NOTE — Progress Notes (Signed)
Occupational Therapy Session Note  Patient Details  Name: Tonya [name] MRN: [id number] Date of Birth: 01/02/1986  Today's Date: 08/01/2011 Time: 0900-1000 Time Calculation (min): 60 min  Short Term Goals: Week 2:  OT Short Term Goal 1 (Week 2): STG = Unmet LTGs  Skilled Therapeutic Interventions/Progress Updates:  Patient on [facility] upon arrival and CNA in room reports she cleaned patient's bottom after BM and patient is ready to transfer off BSC.  Reviewed plan and goals for the session and patient agreeable.  Transferred via slide board BSC>W/C after bed pad placed under patient's bare bottom.  While in W/C, patient gathered clothes, bathed and dressed.  Patient chose not to wash buttock secondary to "NT did that for me while I was on the [facility]".  Focus session on LB bath and dress adaptive techniques and use of reacher.  Patient agrees to attempt to wash own bottom and perform own hygiene when on commode as a goal.  Excellent progress and participation.  Precautions:  Precautions Precautions: Fall Restrictions Weight Bearing Restrictions: Yes RUE Weight Bearing: Weight bearing as tolerated LUE Weight Bearing: Weight bearing as tolerated RLE Weight Bearing: Non weight bearing LLE Weight Bearing: Non weight bearing  Pain: LLE pain, not rated, meds provided.  See FIM for current functional status  Therapy/Group: Individual Therapy  Zelie Asbill 08/01/2011, 11:01 AM
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
## 2011-08-01 NOTE — Progress Notes (Addendum)
Patient called for the bathroom at 0030.  Patient transferred to bedside commode with assistance of Cala Bradford, Charity fundraiser and Toni Amend, Psychologist, sport and exercise.  Both RN and Nurse Tech remained in the patient's room for approximately 35 minutes while the patient attempted to have a bowel movement.  At 0100, the patient stated she was unable to have a bowel movement at that time and requested to return to bed.  Patient refused any laxatives or prune juice at this time.  The patient was transferred to bed and stated that she must have just been having gas.  Patient was positioned comfortably, lighting in room was adjusted per patient request, and call bell was within reach; patient acknowledged that the RN and Nurse Tech were leaving.  Approximately 5 minutes after the RN and Nurse Tech left the patient's room, the patient called and reported that she had a bowel movement in the bed.  When the Nurse Tech arrived to assist the patient, the patient stated that the Nurse Tech and RN should not have left her alone previously because she had not had a bowel movement, despite her request to be put in bed and allowed to rest to see if the gas pressure would subside.  Per Nurse Tech report, the patient was offered the bedpan in case she needed to pass additional stool; patient refused stating she was finished and that she did not want to be left on the bedpan as she had been left on the bedside commode.  Will continue to monitor.

## 2011-08-01 NOTE — Discharge Summary (Signed)
Ready for Rehab. Tonya [name]. Gae Bon, MD, FACS [phone] Trauma Surgeon

## 2011-08-01 NOTE — Progress Notes (Signed)
PSYCHOLOGY  Name:  Tonya [name]  MRN:   [id number]  Date of Birth:  11-12-1985  Today's Date:  08/01/2011   She appears in good spirits. Speech slightly pressured in rate but no overt signs of mania. Reports feeling restless upon awakening in the middle of the night. Making positive statements about her rehab. Pleased with her transfers. Expresses more insight into her behavior and participation with staff. Discussed emotional reactions to limb loss. At this time, she is using humor to cope.  Gladstone Pih, Ph.D

## 2011-08-01 NOTE — Progress Notes (Signed)
Speech Language Pathology Daily Session Note  Patient Details  Name: Tonya [name] MRN: [id number] Date of Birth: 01-19-1986  Today's Date: 08/01/2011 Time: 1000-1100 Time Calculation (min): 60 min  Short Term Goals: Week 2: SLP Short Term Goal 1 (Week 2): Pt will utilize swallowing compensatory strategies with supervision verbal cues without overt s/s of aspiration.   Skilled Therapeutic Interventions: Treatment focus on consuming regular textures and thin liquids without overt s/s of aspiration. Pt prepared meal of chicken alfredo pasta to consume. Pt demonstrated appropriate safety awareness and problem solving for kitchen task. Pt consumed regular textures and required supervision verbal cues to self-monitor and correct intermittent wet vocal quality.    FIM:  Comprehension Comprehension Mode: Auditory Comprehension: 5-Follows basic conversation/direction: With no assist Expression Expression Mode: Verbal Expression: 5-Expresses basic needs/ideas: With extra time/assistive device Social Interaction Social Interaction: 5-Interacts appropriately 90% of the time - Needs monitoring or encouragement for participation or interaction. Problem Solving Problem Solving: 5-Solves basic 90% of the time/requires cueing < 10% of the time Memory Memory: 5-Recognizes or recalls 90% of the time/requires cueing < 10% of the time FIM - Eating Eating Activity: 5: Supervision/cues  Pain No/Denies Pain  Therapy/Group: Individual Therapy  [name] 08/01/2011, 4:02 PM
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
## 2011-08-02 LAB — CBC
MCH: 29.1 pg (ref 26.0–34.0)
MCV: 91.2 fL (ref 78.0–100.0)
Platelets: 226 10*3/uL (ref 150–400)
RDW: 14.5 % (ref 11.5–15.5)

## 2011-08-02 MED ORDER — ENOXAPARIN SODIUM 40 MG/0.4ML ~~LOC~~ SOLN
40.0000 mg | SUBCUTANEOUS | Status: DC
  Administered 2011-08-02 – 2011-08-07 (×6): 40 mg via SUBCUTANEOUS
  Filled 2011-08-02 (×7): qty 0.4

## 2011-08-02 MED ORDER — WARFARIN SODIUM 10 MG PO TABS
10.0000 mg | ORAL_TABLET | Freq: Once | ORAL | Status: AC
  Administered 2011-08-02: 10 mg via ORAL
  Filled 2011-08-02: qty 1

## 2011-08-02 NOTE — Care Management Note (Signed)
Per State Regulation 482.30 This chart was reviewed for medical necessity with respect to the patient's Admission/Duration of stay. Psych med adjustments continue--pain management ongoing.  Working on B/B continence.  Pt participating txs now & showing progress.   Brock Ra                 Nurse Care Manager              Next Review Date: 08/08/11

## 2011-08-02 NOTE — Progress Notes (Signed)
Orthopedic Tech Progress Note Patient Details:  Tonya [name] 11-17-1985 [id number]  Patient ID: Tonya [name], female   DOB: May 24, 1985, 26 y.o.   MRN: [id number] Contacted [facility] for Brace order.  Mashelle Busick T 08/02/2011, 10:10 AM

## 2011-08-02 NOTE — Progress Notes (Signed)
Recreational Therapy Session Note  Patient Details  Name: Elwanda Myranda Pavone MRN: [id number] Date of Birth: 11-17-1985 Today's Date: 08/02/2011 Time:  1030-11 Pain: no c/o Skilled Therapeutic Interventions/Progress Updates: pt participated in tabletop crafts w/c level with supervision/set up assist.  Therapy/Group: Co-Treatment  Activity Level: Simple:  Level of assist: Supervision  Pecolia Marando 08/02/2011, 4:52 PM
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
## 2011-08-02 NOTE — Progress Notes (Addendum)
Physical Therapy Session Note  Patient Details  Name: Tonya [name] MRN: [id number] Date of Birth: 07/26/1985  Today's Date: 08/02/2011 Time: 4098-1191 Time Calculation (min): 50 min  Second Treatment:  13:06-14:04 Time: 58 min  Short Term Goals: Week 2:  PT Short Term Goal 1 (Week 2): STG = LTG's  S:  Pt concerned about the swelling in her foot and wanting to work on exercises to address.  Pt frequently made comments about having to learn to do tasks herself. Skilled Therapeutic Interventions/Progress Updates:   Pt has made excellent mobility gains since this therapist last saw her.  Mode is calm and more rationale.   Therapy Documentation Precautions:  Precautions Precautions: Fall Restrictions Weight Bearing Restrictions: Yes RUE Weight Bearing: Weight bearing as tolerated LUE Weight Bearing: Weight bearing as tolerated RLE Weight Bearing: Non weight bearing LLE Weight Bearing: Non weight bearing Pain: No pain reported. Mobility:  Sliding board transfers during both sessions w/c to mat with close supervision, mild incline. Second session:  BSC transfer with sliding board with min@, mainly to stabilize the equipment.  Transfer BSC to bed with sliding board and min@.  Pt able to reposition herself in bed with bed in [place].  Locomotion :  W/c propulsion on unit both sessions, mod I, 150'  Balance:  Dynamic sitting balance exercises in long sitting tossing red medicine ball and playing volleyball. Second treatment:  Dynamic sitting balance in long sitting with Wii, playing several different sport games. Exercises:  Reviewed pt's HEP performing each exercise as prescribed, added heel slides, hip ab/adduction, ankle pumps, ankle ABCs, theraband exercises for triceps strengthening.  Performing 10 reps x 2 of each exercise. See FIM for current functional status  Therapy/Group: Individual Therapy  Georges Mouse 08/02/2011, 12:05 PM

## 2011-08-02 NOTE — Progress Notes (Signed)
ANTICOAGULATION CONSULT NOTE - Follow Up Consult  Pharmacy Consult for Coumadin Indication: VTE prophylaxis  No Known Allergies  Patient Measurements: Height: 5\' 5"  (165.1 cm) Weight: 235 lb 10.8 oz (106.9 kg) IBW/kg (Calculated) : 57   Vital Signs: Temp: 98.2 F (36.8 C) (07/03 0700) Temp src: Oral (07/03 0700) BP: 118/81 mmHg (07/03 0700) Pulse Rate: 79  (07/03 0700)  Labs:  Basename 08/02/11 0700 08/01/11 0635 07/31/11 0620  HGB 11.6* -- --  HCT 36.3 -- --  PLT 226 -- --  APTT -- -- --  LABPROT 17.2* 19.5* 22.6*  INR 1.38 1.62* 1.95*  HEPARINUNFRC -- -- --  CREATININE -- -- --  CKTOTAL -- -- --  CKMB -- -- --  TROPONINI -- -- --    Estimated Creatinine Clearance: 129.5 ml/min (by C-G formula based on Cr of 0.68).   Medications:  Scheduled:     . antiseptic oral rinse  15 mL Mouth Rinse QID  . bacitracin  1 application Topical BID  . DULoxetine  60 mg Oral Daily  . enoxaparin (LOVENOX) injection  40 mg Subcutaneous Q24H  . ferrous sulfate  325 mg Oral TID WC  . gabapentin  300 mg Oral BID  . gabapentin  600 mg Oral QHS  . HYDROcodone-acetaminophen  1 tablet Oral QID  . multivitamin with minerals  1 tablet Oral Daily  . OLANZapine  15 mg Oral Daily  . QUEtiapine  100 mg Oral Q2000  . senna-docusate  2 tablet Oral BID  . warfarin  7.5 mg Oral ONCE-1800  . Warfarin - Pharmacist Dosing Inpatient   Does not apply q1800    Assessment: 26yo female s/p multiple ortho surgeries, on Coumadin for VTE px.  INR subtherapeutic this morning after no dose was ordered on 6/30. No bleeding noted.  Goal of Therapy:  INR 2-3 Monitor platelets by anticoagulation protocol: Yes   Plan:  Coumadin 10mg  today and f/u daily protime.  Talbert Cage, PharmD Pager [phone]

## 2011-08-02 NOTE — Progress Notes (Signed)
Speech Language Pathology Daily Session Note  Patient Details  Name: Tonya [name] MRN: [id number] Date of Birth: [birth year]  Today's Date: 08/02/2011 Time: 1030-1100 Time Calculation (min): 30 min  Short Term Goals: Week 2: SLP Short Term Goal 1 (Week 2): Pt will utilize swallowing compensatory strategies with supervision verbal cues without overt s/s of aspiration.   Skilled Therapeutic Interventions: Pt consumed thin liquids via straw and utilized small, single sips independently without overt s/s of aspiration.    FIM:  FIM - Eating Eating Activity: 6: Swallowing techniques: self-managed  Pain Pain Assessment Pain Assessment: No/denies pain  Therapy/Group: Individual Therapy  [name] 08/02/2011, 5:05 PM
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
## 2011-08-02 NOTE — Progress Notes (Signed)
Occupational Therapy Session Note  Patient Details  Name: Jaeanna Ahnna Dungan MRN: [id number] Date of Birth: [birth year]  Today's Date: 08/02/2011 Time: 1191-4782 Time Calculation (min): 45 min  Short Term Goals: Week 2:  OT Short Term Goal 1 (Week 2): STG = Unmet LTGs  Skilled Therapeutic Interventions/Progress Updates:  Patient up in w/c upon arrival and fiancee asleep in fold out bed.  Patient performed bath UB bath at sink in w/c with set up, transferred to drop arm commode via slide board with min (steady assist) and completed LB bath and dressing seated on commode.  Patient supervision for slide board transfer back to w/c.  Issued w/c gloves and inspection mirror both for increased independence with mobility and BADL tasks.  Precautions:  Precautions Precautions: Fall Restrictions Weight Bearing Restrictions: Yes RUE Weight Bearing: Weight bearing as tolerated LUE Weight Bearing: Weight bearing as tolerated RLE Weight Bearing: Non weight bearing LLE Weight Bearing: Non weight bearing  Pain: No c/o pain  See FIM for current functional status  Therapy/Group: Individual Therapy  Mazelle Huebert 08/02/2011, 9:59 AM

## 2011-08-02 NOTE — Progress Notes (Signed)
Social Work Patient ID: Tonya [name], female   DOB: 1985/04/16, 26 y.o.   MRN: [id number]  Met yesterday afternoon with patient and fiance, Dameron, to review team conference and d/c planning issues.  Pt and fiance are agreed with team that pt's mental health stability has "definitely" (per fiance) improved since the end of last week.  Both are very pleased with this stabilization in meds, behavior,  Etc.  Explained to both that, as it appears her psychiatry needs have stabilized and the primary focus is her physical rehab, then facility search would be for a skilled nursing facility.  They state understanding of this plan and full agreement.  I stressed to both, however, that this is only a short-term option as pt will likely not continue to qualify for SNF greater than 30 days.  They are aware that if she continues to require just distant supervision/ still has no settled living situation, then she may need to transition to a rest home/ group home.  Neither of them are in favor of this, however, are realistic that they will need to continue to work on physical gains with SNF therapies and fiance will need to continue to try and secure rental housing for them.  They have stated that they fully intend to remain in [place] and both report they intend to remain together. I have started the process for pt to switch her Medicaid to [place], however, pt does have Medicare which will cover her admission to SNF.  Am currently in process of getting an approval from [employer] given her mental health issues before I can send out FL[facility].  Will keep team posted on progress.  [name]

## 2011-08-02 NOTE — Progress Notes (Signed)
Orthopedic Tech Progress Note Patient Details:  Tonya [name] October 22, 1985 [id number] Patient ID: Tonya [name], female   DOB: 1985/02/07, 26 y.o.   MRN: [id number] Brace order fitted by Margaret Pyle of [employer] [facility] at 3:30.  [name] 08/02/2011, 3:30 PM

## 2011-08-03 MED ORDER — WARFARIN SODIUM 7.5 MG PO TABS
7.5000 mg | ORAL_TABLET | Freq: Once | ORAL | Status: AC
  Administered 2011-08-03: 7.5 mg via ORAL
  Filled 2011-08-03: qty 1

## 2011-08-03 NOTE — Progress Notes (Signed)
Physical Therapy Session Note  Patient Details  Name: Tonya [name] MRN: [id number] Date of Birth: 01-Feb-1985  Today's Date: 08/03/2011 Time: 5621-3086 Time Calculation (min): 41 min  Short Term Goals: Week 3:   = revised LTGs  Skilled Therapeutic Interventions/Progress Updates:    Pt's fiance present for session, educated on positioning self for safety during transfers, on need to prevent hip flexion contractures, and on need to encourage pt to perform HEP on own. Fiance "Geraldine Contras" reports he may be able to get a caregiver for the pt soon.   Performed all of HEP and added side lying Rt. Hip abduction strengthening. Pt encouraged to spend time prone to decrease risk of tight hip flexors, pt verbalized understanding. All sliding board transfers performed with supervision.  *Although not discussed with pt or fiance, pt is nearing modified independent with mobility, she will mainly need supervision for psychosocial irregularities which place her at falls risk if she has a manic episode.    Therapy Documentation Precautions:  Precautions Precautions: Fall Restrictions Weight Bearing Restrictions: Yes RUE Weight Bearing: Weight bearing as tolerated LUE Weight Bearing: Weight bearing as tolerated RLE Weight Bearing: Non weight bearing LLE Weight Bearing: Non weight bearing Pain: Pain Assessment Pain Assessment: No/denies pain ("feeling ok")  See FIM for current functional status  Therapy/Group: Individual Therapy  [name] 08/03/2011, 5:07 PM
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
## 2011-08-03 NOTE — Progress Notes (Signed)
Occupational Therapy Session Note  Patient Details  Name: Timarie Bellamarie Pflug MRN: [id number] Date of Birth: [birth year]  Today's Date: 08/03/2011 Time: 8657-8469 Time Calculation (min): 60 min  Short Term Goals: Week 2:  OT Short Term Goal 1 (Week 2): STG = Unmet LTGs  Skilled Therapeutic Interventions:  Patient up in w/c upon arrival.  Self care retraining at a w/c level with sponge bath and lateral leans for cleaning buttocks and pulling up pants.  Patient propped left foot on bed to bathe and to donn sock.  Mild edema noted in left foot last few days.  Reminded patient to keep leg/foot elevated when not in therapy and to practice full AROM of ankle, toes and knee to keep the fluid moving.  Patient verbalized understanding.  Precautions:  Precautions Precautions: Fall Restrictions Weight Bearing Restrictions: Yes RUE Weight Bearing: Weight bearing as tolerated LUE Weight Bearing: Weight bearing as tolerated RLE Weight Bearing: Non weight bearing LLE Weight Bearing: Non weight bearing  Pain: Pain Assessment Pain Assessment: 0-10 Pain Score:   4 Pain Type: Acute pain Pain Location: Leg Pain Descriptors: Aching Pain Frequency: Occasional Pain Onset: On-going Patients Stated Pain Goal: 2 Pain Intervention(s): Medication (See eMAR) (vicodin 1 po)  See FIM for current functional status  Therapy/Group: Individual Therapy  Kaylanni Ezelle 08/03/2011, 9:43 AM

## 2011-08-03 NOTE — Progress Notes (Signed)
Speech Language Pathology Session Note & Discharge Summary  Patient Details  Name: Tonya [name] MRN: [id number] Date of Birth: [birth year]  Today's Date: 08/03/2011 Time: 1100-1130 Time Calculation (min): 30 min  Skilled Therapeutic Intervention: Treatment focus on utilization of swallowing compensatory strategies with regular textures and thin liquids. Pt demonstrated use of strategies with Mod I and did not demonstrate overt s/s of aspiration. Pt educated on current swallowing function and plan for discharge from SLP caseload. Pt verbalized understanding.   Patient has met 3 of 3 long term goals.  Patient to discharge at overall Modified Independent level.  Reasons goals not met: N/A   Clinical Impression/Discharge Summary: Pt has made functional gains and has met all LTG's this admission.  Currently, pt is consuming regular textures with thin liquids without overt s/s of aspiration and utilizes swallowing compensatory strategies with Mod I. Pt continues to demonstrate a hoarse vocal quality but is 100% intelligible at the conversation level. Pt does not need skilled SLP follow-up at this time.   Care Partner:  Caregiver Able to Provide Assistance:  (N/A)     Recommendation:  None      Equipment: N/A   Reasons for discharge: Treatment goals met   Patient/Family Agrees with Progress Made and Goals Achieved: Yes   See FIM for current functional status  [name] 08/03/2011, 4:21 PM
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
## 2011-08-03 NOTE — Progress Notes (Signed)
Physical Therapy Weekly Progress Note  Patient Details  Name: Tonya [name] MRN: [id number] Date of Birth: January 25, 1986  Today's Date: 08/03/2011 Time: 1000-1055 Time Calculation (min): 55 min  Patient has met  7 of 8 short term goals which = long term goals.  Pt given revised long term goals due to progress, will need to continued reinforcement of HEP  Patient continues to demonstrate the following deficits: increased need for assist, decreased safety awareness, difficulty with transfers and therefore will continue to benefit from skilled PT intervention to enhance overall performance with activity tolerance, balance, ability to compensate for deficits and prepare for prosthesis.  Patient progressing toward long term goals..  Continue plan of care.  PT Short Term Goals Week 2:  PT Short Term Goal 1 (Week 2): STG = LTG's  Skilled Therapeutic Interventions/Progress Updates:    Pt wheeling around unit at start of treatment, pt performing wheelchair mobility >200' during session for activity tolerance, modified independent. Sliding board transfers practiced to/from car, bed, and low couch. Pt needed min assist with transfer couch to chair however all other transfers performed at supervision level for safety. Ball toss while seated on dynadisc to promote core strength and promote improved balance reactions. Encouraged pt to perform prone hip stretch when she is in the bed and to continue HEP.    Therapy Documentation Precautions:  Precautions Precautions: Fall Restrictions Weight Bearing Restrictions: Yes RUE Weight Bearing: Weight bearing as tolerated LUE Weight Bearing: Weight bearing as tolerated RLE Weight Bearing: Non weight bearing LLE Weight Bearing: Non weight bearing Pain: Pain Assessment Pain Assessment: 0-10 Pain Score:   4 Pain Type: Acute pain Pain Location: Leg Pain Descriptors: Aching Pain Frequency: Occasional Pain Onset: On-going Patients Stated Pain Goal:  2   See FIM for current functional status  Therapy/Group: Individual Therapy  [name] 08/03/2011, 10:58 AM

## 2011-08-03 NOTE — Progress Notes (Signed)
ANTICOAGULATION CONSULT NOTE - Follow Up Consult  Pharmacy Consult for Coumadin Indication: VTE prophylaxis  No Known Allergies  Patient Measurements: Height: 5\' 5"  (165.1 cm) Weight: 229 lb 15 oz (104.3 kg) IBW/kg (Calculated) : 57   Vital Signs: Temp: 98.6 F (37 C) (07/04 0500) Temp src: Oral (07/04 0500) BP: 113/75 mmHg (07/04 0500) Pulse Rate: 117  (07/04 0500)  Labs:  Basename 08/03/11 0650 08/02/11 0700 08/01/11 0635  HGB -- 11.6* --  HCT -- 36.3 --  PLT -- 226 --  APTT -- -- --  LABPROT 19.8* 17.2* 19.5*  INR 1.65* 1.38 1.62*  HEPARINUNFRC -- -- --  CREATININE -- -- --  CKTOTAL -- -- --  CKMB -- -- --  TROPONINI -- -- --    Estimated Creatinine Clearance: 127.7 ml/min (by C-G formula based on Cr of 0.68).   Medications:  Scheduled:     . antiseptic oral rinse  15 mL Mouth Rinse QID  . bacitracin  1 application Topical BID  . DULoxetine  60 mg Oral Daily  . enoxaparin (LOVENOX) injection  40 mg Subcutaneous Q24H  . ferrous sulfate  325 mg Oral TID WC  . gabapentin  300 mg Oral BID  . gabapentin  600 mg Oral QHS  . HYDROcodone-acetaminophen  1 tablet Oral QID  . multivitamin with minerals  1 tablet Oral Daily  . OLANZapine  15 mg Oral Daily  . QUEtiapine  100 mg Oral Q2000  . senna-docusate  2 tablet Oral BID  . warfarin  10 mg Oral ONCE-1800  . Warfarin - Pharmacist Dosing Inpatient   Does not apply q1800    Assessment: 25yo female s/p multiple ortho surgeries, on Coumadin for VTE px.  INR subtherapeutic this morning after no dose was ordered on 6/30. No bleeding noted.  Goal of Therapy:  INR 2-3 Monitor platelets by anticoagulation protocol: Yes   Plan:  Coumadin 7.5mg  today and f/u daily protime. Continue lovenox.  Talbert Cage, PharmD Pager [phone]

## 2011-08-04 DIAGNOSIS — Z5189 Encounter for other specified aftercare: Secondary | ICD-10-CM

## 2011-08-04 DIAGNOSIS — S82839A Other fracture of upper and lower end of unspecified fibula, initial encounter for closed fracture: Secondary | ICD-10-CM

## 2011-08-04 DIAGNOSIS — F3110 Bipolar disorder, current episode manic without psychotic features, unspecified: Secondary | ICD-10-CM

## 2011-08-04 DIAGNOSIS — S82109A Unspecified fracture of upper end of unspecified tibia, initial encounter for closed fracture: Secondary | ICD-10-CM

## 2011-08-04 DIAGNOSIS — IMO0002 Reserved for concepts with insufficient information to code with codable children: Secondary | ICD-10-CM

## 2011-08-04 MED ORDER — WARFARIN SODIUM 10 MG PO TABS
10.0000 mg | ORAL_TABLET | Freq: Once | ORAL | Status: AC
  Administered 2011-08-04: 10 mg via ORAL
  Filled 2011-08-04: qty 1
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
## 2011-08-04 NOTE — Progress Notes (Signed)
Occupational Therapy Session Note  Patient Details  Name: Tauri Jael Waldorf MRN: [id number] Date of Birth: [birth year]  Today's Date: 08/04/2011 Time: 0800-0925 Time Calculation (min): 85 min  Short Term Goals: Week 2:  OT Short Term Goal 1 (Week 2): STG = Unmet LTGs  Skilled Therapeutic Interventions:  Self care retraining to include her first shower since admission into the hospital!  Shower took place in the therapy bathroom with tub/shower combination with curtain and HH shower.  Min assist with SB transfer><tub bench.  RN reapplied wound dressings.  Dressing and grooming from W/C.  Therapy Documentation Precautions:  Precautions Precautions: Fall Restrictions Weight Bearing Restrictions: Yes RUE Weight Bearing: Weight bearing as tolerated LUE Weight Bearing: Weight bearing as tolerated RLE Weight Bearing: Non weight bearing LLE Weight Bearing: Non weight bearing  Pain: No c/o pain  See FIM for current functional status  Therapy/Group: Individual Therapy  Fernandez Kenley 08/04/2011, 9:51 AM

## 2011-08-04 NOTE — Progress Notes (Signed)
ANTICOAGULATION CONSULT NOTE - Follow Up Consult  Pharmacy Consult for Coumadin Indication: VTE prophylaxis  No Known Allergies  Patient Measurements: Height: 5\' 5"  (165.1 cm) Weight: 229 lb 15 oz (104.3 kg) IBW/kg (Calculated) : 57   Vital Signs: Temp: 98.5 F (36.9 C) (07/05 0500) Temp src: Oral (07/05 0500) BP: 140/90 mmHg (07/05 0500) Pulse Rate: 123  (07/05 0500)  Labs:  Basename 08/04/11 0630 08/03/11 0650 08/02/11 0700  HGB -- -- 11.6*  HCT -- -- 36.3  PLT -- -- 226  APTT -- -- --  LABPROT 19.2* 19.8* 17.2*  INR 1.58* 1.65* 1.38  HEPARINUNFRC -- -- --  CREATININE -- -- --  CKTOTAL -- -- --  CKMB -- -- --  TROPONINI -- -- --    Estimated Creatinine Clearance: 127.7 ml/min (by C-G formula based on Cr of 0.68).   Medications:  Scheduled:     . antiseptic oral rinse  15 mL Mouth Rinse QID  . bacitracin  1 application Topical BID  . DULoxetine  60 mg Oral Daily  . enoxaparin (LOVENOX) injection  40 mg Subcutaneous Q24H  . ferrous sulfate  325 mg Oral TID WC  . gabapentin  300 mg Oral BID  . gabapentin  600 mg Oral QHS  . HYDROcodone-acetaminophen  1 tablet Oral QID  . multivitamin with minerals  1 tablet Oral Daily  . OLANZapine  15 mg Oral Daily  . QUEtiapine  100 mg Oral Q2000  . senna-docusate  2 tablet Oral BID  . warfarin  7.5 mg Oral ONCE-1800  . Warfarin - Pharmacist Dosing Inpatient   Does not apply q1800    Assessment: 25yo female s/p multiple ortho surgeries, on Coumadin for VTE px.  INR subtherapeutic again this morning after no dose was ordered on 6/30. No bleeding noted.  Goal of Therapy:  INR 2-3 Monitor platelets by anticoagulation protocol: Yes   Plan:  Coumadin 10mg  today and f/u daily protime. Continue lovenox.  Talbert Cage, PharmD Pager [phone]

## 2011-08-04 NOTE — Progress Notes (Signed)
Physical Therapy Session Note  Patient Details  Name: Tonya [name] MRN: [id number] Date of Birth: 1985-09-06  Today's Date: 08/04/2011 Time: 1000-1055 Time Calculation (min): 55 min  Short Term Goals: Week 3:   = unmet LTGs  Skilled Therapeutic Interventions/Progress Updates:  No c/o pain   Sliding board transfer to/from elevated mat performed with supervision, nearly modified independent. BIl. UE resisted rows then external rotation 2 x 10 reps with green band for shoulder integrity. Educated on scar tissue massage and need to exercise UE post massage to increase ROM of Lt. Elbow re: pt's concerns of decreased ROM from scar. Partial sit ups on wedge with weighted yellow ball toss. Added rotation for oblique strengthening. Prone Rt. Hip stretch. Prone Rt. Hip extension 2 x 10 reps. Pt making good progress.   Second Session Skilled Therapeutic Interventions/Progress Updates:  Time:  4696-2952 Time Calculation (min): 42 min Pain: 6/10 Generalized pain, pt encouraged to call for pain medication if needed. Pt reports she is very tired this afternoon and does not feel like PT. Encouraged pt to continue to work with PT, pt agreed to work on balance. Dynamic balance while participating in wii tennis and bowling competing against fiance while seated on compliant surface. Partial sit ups from wedge with weighted ball toss with fiance. Performed sliding board transfer with fiance setting up chair and providing the supervision, verbal cues for safe caregiver positioning.    Therapy Documentation Precautions:  Precautions Precautions: Fall Restrictions Weight Bearing Restrictions: Yes RUE Weight Bearing: Weight bearing as tolerated LUE Weight Bearing: Weight bearing as tolerated RLE Weight Bearing: Non weight bearing LLE Weight Bearing: Non weight bearing  See FIM for current functional status  Therapy/Group: Individual Therapy both sessions  [name] 08/04/2011, 12:16  PM

## 2011-08-04 NOTE — Progress Notes (Signed)
Patient ID: Tonya [name], female   DOB: November 27, 1985, 26 y.o.   MRN: [id number]  Subjective/Complaints: Good night. Slept well. Happy that she's feeling better. Good day overall yesterday. Marland KitchenMarland KitchenMarland KitchenA 12 point review of systems has been performed and if not noted above is otherwise negative.    BP Readings from Last 3 Encounters:  08/04/11 140/90  07/19/11 123/64  07/19/11 123/64   Lab Results  Component Value Date   INR 1.58* 08/04/2011   INR 1.65* 08/03/2011   INR 1.38 08/02/2011     Objective: Vital Signs: Blood pressure 140/90, pulse 123, temperature 98.5 F (36.9 C), temperature source Oral, resp. rate 20, height 5\' 5"  (1.651 m), weight 104.3 kg (229 lb 15 oz), last menstrual period 06/23/2011, SpO2 94.00%. No results found.  Basename 08/02/11 0700  WBC 6.8  HGB 11.6*  HCT 36.3  PLT 226   No results found for this basename: NA:2,K:2,CL:2,CO2:2,GLUCOSE:2,BUN:2,CREATININE:2,CALCIUM:2 in the last 72 hours CBG (last 3)  No results found for this basename: GLUCAP:3 in the last 72 hours  Wt Readings from Last 3 Encounters:  08/03/11 104.3 kg (229 lb 15 oz)  07/17/11 106.958 kg (235 lb 12.8 oz)  07/17/11 106.958 kg (235 lb 12.8 oz)    Physical Exam:  General appearance: alert, cooperative, appears stated age and no distress Head: Normocephalic, without obvious abnormality, atraumatic Eyes: conjunctivae/corneas clear. PERRL, EOM's intact. Ears: normal external ear canals both ears Nose: Nares normal. Septum midline. Mucosa normal. No drainage or sinus tenderness. Throat: lips, mucosa, and tongue normal; teeth and gums normal Neck: no adenopathy, no carotid bruit, no JVD, supple, symmetrical, trachea midline and thyroid not enlarged, symmetric, no tenderness/mass/nodules Back: symmetric, no curvature. ROM normal. No CVA tenderness. Resp: clear to auscultation bilaterally Cardio:  tachy regular  rhythm, S1, S2 normal, no murmur, click, rub or gallop GI: soft, non-tender; bowel  sounds normal; no masses,  no organomegaly Extremities: extremities normal, atraumatic, no cyanosis or edema Pulses: 2+ and symmetric Skin: Skin color, texture, turgor normal. No rashes or lesions Neurologic: Grossly normal Incision/Wound: multiple wounds with eschar and granulation. Minimal drainage. . Sutures removed from left AKA. Mild serous drainage. Wound approximated Psych: less pressured speech and anxiety. More redirectable. Better awareness overall.  Assessment/Plan: 1. Functional deficits secondary to traumatic right AKA, left tib-fib fx, left 2mc fx, multiple soft-tissue trauma which require 3+ hours per day of interdisciplinary therapy in a comprehensive inpatient rehab setting. Physiatrist is providing close team supervision and 24 hour management of active medical problems listed below. Physiatrist and rehab team continue to assess barriers to discharge/monitor patient progress toward functional and medical goals.  Still looking at SNF placement FIM: FIM - Bathing Bathing Steps Patient Completed: Chest;Right Arm;Left Arm;Abdomen;Front perineal area;Right upper leg;Left upper leg;Left lower leg (including foot);Buttocks Bathing: 5: Supervision: Safety issues/verbal cues  FIM - Upper Body Dressing/Undressing Upper body dressing/undressing steps patient completed: Thread/unthread right bra strap;Thread/unthread left bra strap;Hook/unhook bra;Thread/unthread right sleeve of pullover shirt/dresss;Thread/unthread left sleeve of pullover shirt/dress;Put head through opening of pull over shirt/dress;Pull shirt over trunk Upper body dressing/undressing: 5: Supervision: Safety issues/verbal cues FIM - Lower Body Dressing/Undressing Lower body dressing/undressing steps patient completed: Thread/unthread right underwear leg;Thread/unthread left underwear leg;Pull underwear up/down;Thread/unthread right pants leg;Thread/unthread left pants leg;Pull pants up/down;Don/Doff left sock Lower  body dressing/undressing: 4: Min-Patient completed 75 plus % of tasks  FIM - Toileting Toileting steps completed by patient: Adjust clothing prior to toileting;Performs perineal hygiene;Adjust clothing after toileting Toileting Assistive Devices: Grab bar or rail for  support Toileting: 5: Supervision: Safety issues/verbal cues  FIM - Diplomatic Services operational officer Devices: Human resources officer Transfers: 4-To toilet/BSC: Min A (steadying Pt. > 75%)  FIM - Banker Devices: Sliding board Bed/Chair Transfer: 6: Supine > Sit: No assist;6: Sit > Supine: No assist;5: Bed > Chair or W/C: Supervision (verbal cues/safety issues);5: Chair or W/C > Bed: Supervision (verbal cues/safety issues)  FIM - Locomotion: Wheelchair Distance: >200' Locomotion: Wheelchair: 6: Travels 150 ft or more, turns around, maneuvers to table, bed or toilet, negotiates 3% grade: maneuvers on rugs and over door sills independently FIM - Locomotion: Ambulation Locomotion: Ambulation: 0: Activity did not occur (not appropriate)  Comprehension Comprehension Mode: Auditory Comprehension: 5-Follows basic conversation/direction: With no assist  Expression Expression Mode: Verbal Expression: 5-Expresses basic needs/ideas: With no assist  Social Interaction Social Interaction: 6-Interacts appropriately with others with medication or extra time (anti-anxiety, antidepressant).  Problem Solving Problem Solving: 5-Solves basic 90% of the time/requires cueing < 10% of the time  Memory Memory: 6-More than reasonable amt of time  1. Polytrauma after suicide attempt 06/27/2011  2. DVT Prophylaxis/Anticoagulation: Coumadin. Venous Doppler study 07/07/2011 negative. Monitor platelet counts any signs of bleeding  -resumed lovenox 40 qday for decreased INR 3. Pain management. Wean meds.. Monitor with increased mobility. neurontin for phantom limb pain to help  with mood stabilization.  4. Right AKA 06/27/2011. Dressing changes as advised- wearing shrinker garment at present  -dry dressing to stump. Slight serous drainage noted. 5. Severely comminuted left tibia-fibula fracture. ORIF/nonweightbearing  6. Left second metacarpal fracture and right forearm fifth digit extensor tendon injury status post repair. Weightbearing as tolerated. She is now out of the splint. May benefit from a small wrist splint for support.  -ordered gloves for wheelchair. 7. Bipolar/mania:  -improved affect and awareness  -seroquel 100mg  qhs, zyprexa 15mg  qam- pt mentions something about psych coming by and suggesting a decreased seroquel dose, but i don't see it. She's doing much better and i'm not inclined to change things heading into the weekend.  - Continue cymbalta at 60mg   -appreciate Dr. Blenda Peals help and Dr. Maxwell Marion input  -continue neurontin 300mg  bid and 600 qhs)--this has helped with pain also  -cogentin dc'ed     LOS (Days) 16 A FACE TO FACE EVALUATION WAS PERFORMED  Kasiyah Platter T 08/04/2011, 7:24 AM

## 2011-08-04 NOTE — Progress Notes (Signed)
PSYCHOLOGY  Name:  Tonya [name]  MRN:   [id number]  Date of Birth:  September 09, 1985 Date:   08/04/11  She appeared in good spirits. Her mood appeared stable. She reports improved sleep and feeling more relaxed at night. She expressed satisfaction with her recent progress in rehab. She denied suicidal ideation. She realizes that she may need to transition to a short-term skilled nursing facility. She expressed interest in enrolling in an outpatient mental health program.  Gladstone Pih, Ph.D

## 2011-08-05 MED ORDER — WARFARIN SODIUM 10 MG PO TABS
10.0000 mg | ORAL_TABLET | Freq: Once | ORAL | Status: AC
  Administered 2011-08-05: 10 mg via ORAL
  Filled 2011-08-05: qty 1

## 2011-08-05 NOTE — Progress Notes (Signed)
ANTICOAGULATION CONSULT NOTE - Follow Up Consult  Pharmacy Consult for Coumadin Indication: VTE prophylaxis  No Known Allergies  Patient Measurements: Height: 5\' 5"  (165.1 cm) Weight: 229 lb 15 oz (104.3 kg) IBW/kg (Calculated) : 57   Vital Signs: Temp: 99.1 F (37.3 C) (07/06 0500) Temp src: Oral (07/06 0500) BP: 128/96 mmHg (07/06 0500) Pulse Rate: 119  (07/06 0500)  Labs:  Basename 08/05/11 0555 08/04/11 0630 08/03/11 0650  HGB -- -- --  HCT -- -- --  PLT -- -- --  APTT -- -- --  LABPROT 20.1* 19.2* 19.8*  INR 1.68* 1.58* 1.65*  HEPARINUNFRC -- -- --  CREATININE -- -- --  CKTOTAL -- -- --  CKMB -- -- --  TROPONINI -- -- --    Estimated Creatinine Clearance: 127.7 ml/min (by C-G formula based on Cr of 0.68).   Medications:  Scheduled:     . antiseptic oral rinse  15 mL Mouth Rinse QID  . bacitracin  1 application Topical BID  . DULoxetine  60 mg Oral Daily  . enoxaparin (LOVENOX) injection  40 mg Subcutaneous Q24H  . ferrous sulfate  325 mg Oral TID WC  . gabapentin  300 mg Oral BID  . gabapentin  600 mg Oral QHS  . HYDROcodone-acetaminophen  1 tablet Oral QID  . multivitamin with minerals  1 tablet Oral Daily  . OLANZapine  15 mg Oral Daily  . QUEtiapine  100 mg Oral Q2000  . senna-docusate  2 tablet Oral BID  . warfarin  10 mg Oral ONCE-1800  . Warfarin - Pharmacist Dosing Inpatient   Does not apply q1800    Assessment: 25yo female s/p multiple ortho surgeries, on Coumadin for VTE px.  INR subtherapeutic again this morning but trending up. No bleeding noted.  Goal of Therapy:  INR 2-3 Monitor platelets by anticoagulation protocol: Yes   Plan:  Coumadin 10mg  today and f/u daily protime. Continue lovenox. Check cbc tomorrow.  Talbert Cage, PharmD Pager [phone]

## 2011-08-05 NOTE — Progress Notes (Signed)
Subjective/Complaints:  Good night. Slept well. Happy that she's feeling better. Good day overall yesterday. No new complaints. Marland KitchenMarland KitchenMarland KitchenA 12 point review of systems has been performed and if not noted above is otherwise negative.    BP Readings from Last 3 Encounters:  08/05/11 128/96  07/19/11 123/64  07/19/11 123/64   Lab Results  Component Value Date   INR 1.68* 08/05/2011   INR 1.58* 08/04/2011   INR 1.65* 08/03/2011     Objective: Vital Signs: Blood pressure 128/96, pulse 119, temperature 99.1 F (37.3 C), temperature source Oral, resp. rate 20, height 5\' 5"  (1.651 m), weight 229 lb 15 oz (104.3 kg), last menstrual period 06/23/2011, SpO2 94.00%. No results found. No results found for this basename: WBC:2,HGB:2,HCT:2,PLT:2 in the last 72 hours No results found for this basename: NA:2,K:2,CL:2,CO2:2,GLUCOSE:2,BUN:2,CREATININE:2,CALCIUM:2 in the last 72 hours CBG (last 3)  No results found for this basename: GLUCAP:3 in the last 72 hours  Wt Readings from Last 3 Encounters:  08/03/11 229 lb 15 oz (104.3 kg)  07/17/11 235 lb 12.8 oz (106.958 kg)  07/17/11 235 lb 12.8 oz (106.958 kg)    Physical Exam:  General appearance: alert, cooperative, appears stated age and no distress Head: Normocephalic, without obvious abnormality, atraumatic Eyes: conjunctivae/corneas clear. PERRL, EOM's intact. Ears: normal external ear canals both ears Nose: Nares normal. Septum midline. Mucosa normal. No drainage or sinus tenderness. Throat: lips, mucosa, and tongue normal; teeth and gums normal Neck: no adenopathy, no carotid bruit, no JVD, supple, symmetrical, trachea midline and thyroid not enlarged, symmetric, no tenderness/mass/nodules Back: symmetric, no curvature. ROM normal. No CVA tenderness. Resp: clear to auscultation bilaterally Cardio:  tachy regular  rhythm, S1, S2 normal, no murmur, click, rub or gallop GI: soft, non-tender; bowel sounds normal; no masses,  no  organomegaly Extremities: extremities normal, atraumatic, no cyanosis or edema Pulses: 2+ and symmetric Skin: Skin color, texture, turgor normal. No rashes or lesions Neurologic: Grossly normal Incision/Wound: multiple wounds with eschar and granulation. Minimal drainage. . Sutures removed from left AKA. Mild serous drainage. Wound approximated Psych: less pressured speech and anxiety. More redirectable. Better awareness overall.  Assessment/Plan: 1. Functional deficits secondary to traumatic right AKA, left tib-fib fx, left 2mc fx, multiple soft-tissue trauma which require 3+ hours per day of interdisciplinary therapy in a comprehensive inpatient rehab setting. Physiatrist is providing close team supervision and 24 hour management of active medical problems listed below. Physiatrist and rehab team continue to assess barriers to discharge/monitor patient progress toward functional and medical goals.  Still looking at SNF placement FIM: FIM - Bathing Bathing Steps Patient Completed: Chest;Right Arm;Left Arm;Abdomen;Front perineal area;Right upper leg;Left upper leg;Left lower leg (including foot);Buttocks Bathing: 6: More than reasonable amount of time  FIM - Upper Body Dressing/Undressing Upper body dressing/undressing steps patient completed: Thread/unthread right bra strap;Thread/unthread left bra strap;Hook/unhook bra;Thread/unthread right sleeve of pullover shirt/dresss;Thread/unthread left sleeve of pullover shirt/dress;Put head through opening of pull over shirt/dress;Pull shirt over trunk Upper body dressing/undressing: 6: More than reasonable amount of time FIM - Lower Body Dressing/Undressing Lower body dressing/undressing steps patient completed: Thread/unthread right underwear leg;Thread/unthread left underwear leg;Pull underwear up/down;Thread/unthread right pants leg;Thread/unthread left pants leg;Pull pants up/down;Don/Doff left sock Lower body dressing/undressing: 5:  Supervision: Safety issues/verbal cues  FIM - Toileting Toileting steps completed by patient: Adjust clothing prior to toileting;Performs perineal hygiene;Adjust clothing after toileting Toileting Assistive Devices: Grab bar or rail for support Toileting: 5: Supervision: Safety issues/verbal cues  FIM - Diplomatic Services operational officer Devices: Bedside commode;Sliding  board Toilet Transfers: 4-To toilet/BSC: Min A (steadying Pt. > 75%)  FIM - Banker Devices: Sliding board Bed/Chair Transfer: 6: Supine > Sit: No assist;6: Sit > Supine: No assist;5: Bed > Chair or W/C: Supervision (verbal cues/safety issues);5: Chair or W/C > Bed: Supervision (verbal cues/safety issues)  FIM - Locomotion: Wheelchair Distance: >200' Locomotion: Wheelchair: 6: Travels 150 ft or more, turns around, maneuvers to table, bed or toilet, negotiates 3% grade: maneuvers on rugs and over door sills independently FIM - Locomotion: Ambulation Locomotion: Ambulation: 0: Activity did not occur (NA)  Comprehension Comprehension Mode: Auditory Comprehension: 5-Follows basic conversation/direction: With no assist  Expression Expression Mode: Verbal Expression: 5-Expresses basic needs/ideas: With extra time/assistive device  Social Interaction Social Interaction: 6-Interacts appropriately with others with medication or extra time (anti-anxiety, antidepressant).  Problem Solving Problem Solving: 5-Solves complex 90% of the time/cues < 10% of the time  Memory Memory: 6-More than reasonable amt of time  1. Polytrauma after suicide attempt 06/27/2011  2. DVT Prophylaxis/Anticoagulation: Coumadin. Venous Doppler study 07/07/2011 negative. Monitor platelet counts any signs of bleeding  -resumed lovenox 40 qday for decreased INR 3. Pain management. Wean meds.. Monitor with increased mobility. neurontin for phantom limb pain to help with mood stabilization.  4. Right  AKA 06/27/2011. Dressing changes as advised- wearing shrinker garment at present  -dry dressing to stump. Slight serous drainage noted. 5. Severely comminuted left tibia-fibula fracture. ORIF/nonweightbearing  6. Left second metacarpal fracture and right forearm fifth digit extensor tendon injury status post repair. Weightbearing as tolerated. She is now out of the splint. May benefit from a small wrist splint for support.  -ordered gloves for wheelchair. 7. Bipolar/mania:  -improved affect and awareness  -seroquel 100mg  qhs, zyprexa 15mg  qam- pt mentions something about psych coming by and suggesting a decreased seroquel dose, but i don't see it. She's doing much better and i'm not inclined to change things heading into the weekend.  - Continue cymbalta at 60mg   -appreciate Dr. Blenda Peals help and Dr. Maxwell Marion input  -continue neurontin 300mg  bid and 600 qhs)--this has helped with pain also  -cogentin dc'ed     LOS (Days) 17 A FACE TO FACE EVALUATION WAS PERFORMED  Alex Plotnikov 08/05/2011, 8:22 AM

## 2011-08-06 LAB — CBC
Hemoglobin: 11.2 g/dL — ABNORMAL LOW (ref 12.0–15.0)
RBC: 3.94 MIL/uL (ref 3.87–5.11)

## 2011-08-06 LAB — PROTIME-INR
INR: 1.8 — ABNORMAL HIGH (ref 0.00–1.49)
Prothrombin Time: 21.2 seconds — ABNORMAL HIGH (ref 11.6–15.2)

## 2011-08-06 MED ORDER — WARFARIN SODIUM 10 MG PO TABS
10.0000 mg | ORAL_TABLET | Freq: Once | ORAL | Status: AC
  Administered 2011-08-06: 10 mg via ORAL
  Filled 2011-08-06: qty 1

## 2011-08-06 NOTE — Progress Notes (Signed)
Patient ID: Tonya [name], female   DOB: March 26, 1985, 26 y.o.   MRN: [id number]  Subjective/Complaints:  Good night. Slept well. Happy that she's feeling better. Good day overall yesterday. No new complaints, except for feeling achy this am... .Marland KitchenMarland KitchenA 12 point review of systems has been performed and if not noted above is otherwise negative.    BP Readings from Last 3 Encounters:  08/06/11 128/85  07/19/11 123/64  07/19/11 123/64   Lab Results  Component Value Date   INR 1.80* 08/06/2011   INR 1.68* 08/05/2011   INR 1.58* 08/04/2011     Objective: Vital Signs: Blood pressure 128/85, pulse 102, temperature 98.2 F (36.8 C), temperature source Oral, resp. rate 20, height 5\' 5"  (1.651 m), weight 229 lb 15 oz (104.3 kg), last menstrual period 06/23/2011, SpO2 98.00%. No results found.  Basename 08/06/11 0520  WBC 6.5  HGB 11.2*  HCT 34.9*  PLT 223   No results found for this basename: NA:2,K:2,CL:2,CO2:2,GLUCOSE:2,BUN:2,CREATININE:2,CALCIUM:2 in the last 72 hours CBG (last 3)  No results found for this basename: GLUCAP:3 in the last 72 hours  Wt Readings from Last 3 Encounters:  08/03/11 229 lb 15 oz (104.3 kg)  07/17/11 235 lb 12.8 oz (106.958 kg)  07/17/11 235 lb 12.8 oz (106.958 kg)    Physical Exam:  General appearance: alert, cooperative, appears stated age and no distress Head: Normocephalic, without obvious abnormality, atraumatic Eyes: conjunctivae/corneas clear. PERRL, EOM's intact. Ears: normal external ear canals both ears Nose: Nares normal. Septum midline. Mucosa normal. No drainage or sinus tenderness. Throat: lips, mucosa, and tongue normal; teeth and gums normal Neck: no adenopathy, no carotid bruit, no JVD, supple, symmetrical, trachea midline and thyroid not enlarged, symmetric, no tenderness/mass/nodules Back: symmetric, no curvature. ROM normal. No CVA tenderness. Resp: clear to auscultation bilaterally Cardio:  tachy regular  rhythm, S1, S2 normal, no  murmur, click, rub or gallop GI: soft, non-tender; bowel sounds normal; no masses,  no organomegaly Extremities: extremities normal, atraumatic, no cyanosis or edema Pulses: 2+ and symmetric Skin: Skin color, texture, turgor normal. No rashes or lesions Neurologic: Grossly normal Incision/Wound: multiple wounds with eschar and granulation. Minimal drainage. . Sutures removed from left AKA. Mild serous drainage. Wound approximated Psych: less pressured speech and anxiety. More redirectable. Better awareness overall.  Assessment/Plan: 1. Functional deficits secondary to traumatic right AKA, left tib-fib fx, left 2mc fx, multiple soft-tissue trauma which require 3+ hours per day of interdisciplinary therapy in a comprehensive inpatient rehab setting. Physiatrist is providing close team supervision and 24 hour management of active medical problems listed below. Physiatrist and rehab team continue to assess barriers to discharge/monitor patient progress toward functional and medical goals.  Still looking at SNF placement FIM: FIM - Bathing Bathing Steps Patient Completed: Chest;Right Arm;Left Arm;Abdomen;Front perineal area;Buttocks;Right upper leg;Left upper leg Bathing: 6: More than reasonable amount of time  FIM - Upper Body Dressing/Undressing Upper body dressing/undressing steps patient completed: Thread/unthread right bra strap;Thread/unthread left bra strap;Hook/unhook bra;Thread/unthread right sleeve of pullover shirt/dresss;Thread/unthread left sleeve of pullover shirt/dress;Put head through opening of pull over shirt/dress;Pull shirt over trunk Upper body dressing/undressing: 6: More than reasonable amount of time FIM - Lower Body Dressing/Undressing Lower body dressing/undressing steps patient completed: Thread/unthread right underwear leg;Thread/unthread left underwear leg;Pull underwear up/down;Thread/unthread right pants leg;Thread/unthread left pants leg;Pull pants up/down Lower  body dressing/undressing: 5: Supervision: Safety issues/verbal cues  FIM - Toileting Toileting steps completed by patient: Adjust clothing prior to toileting;Performs perineal hygiene;Adjust clothing after toileting Toileting Assistive Devices:  Grab bar or rail for support Toileting: 6: More than reasonable amount of time  FIM - Diplomatic Services operational officer Devices: Human resources officer Transfers: 5-From toilet/BSC: Supervision (verbal cues/safety issues);5-To toilet/BSC: Supervision (verbal cues/safety issues)  FIM - Banker Devices: Sliding board Bed/Chair Transfer: 5: Supine > Sit: Supervision (verbal cues/safety issues);5: Bed > Chair or W/C: Supervision (verbal cues/safety issues);5: Chair or W/C > Bed: Supervision (verbal cues/safety issues)  FIM - Locomotion: Wheelchair Distance: >200' Locomotion: Wheelchair: 6: Travels 150 ft or more, turns around, maneuvers to table, bed or toilet, negotiates 3% grade: maneuvers on rugs and over door sills independently FIM - Locomotion: Ambulation Locomotion: Ambulation: 0: Activity did not occur (NA)  Comprehension Comprehension Mode: Auditory Comprehension: 5-Follows basic conversation/direction: With no assist  Expression Expression Mode: Verbal Expression: 5-Expresses basic needs/ideas: With extra time/assistive device  Social Interaction Social Interaction: 6-Interacts appropriately with others with medication or extra time (anti-anxiety, antidepressant).  Problem Solving Problem Solving: 5-Solves complex 90% of the time/cues < 10% of the time  Memory Memory: 6-More than reasonable amt of time  1. Polytrauma after suicide attempt 06/27/2011  2. DVT Prophylaxis/Anticoagulation: Coumadin. Venous Doppler study 07/07/2011 negative. Monitor platelet counts any signs of bleeding  -resumed lovenox 40 qday for decreased INR 3. Pain management. Wean meds.. Monitor  with increased mobility. neurontin for phantom limb pain to help with mood stabilization.  4. Right AKA 06/27/2011. Dressing changes as advised- wearing shrinker garment at present  -dry dressing to stump. Slight serous drainage noted. 5. Severely comminuted left tibia-fibula fracture. ORIF/nonweightbearing  6. Left second metacarpal fracture and right forearm fifth digit extensor tendon injury status post repair. Weightbearing as tolerated. She is now out of the splint. May benefit from a small wrist splint for support.  -ordered gloves for wheelchair. 7. Bipolar/mania:  -improved affect and awareness  -seroquel 100mg  qhs, zyprexa 15mg  qam- pt mentions something about psych coming by and suggesting a decreased seroquel dose, but i don't see it. She's doing much better and i'm not inclined to change things heading into the weekend.  - Continue cymbalta at 60mg   -appreciate Dr. Blenda Peals help and Dr. Maxwell Marion input  -continue neurontin 300mg  bid and 600 qhs)--this has helped with pain also  -cogentin dc'ed 8. Achyness on 7/7 - will watch    LOS (Days) 18 A FACE TO FACE EVALUATION WAS PERFORMED  Alex Plotnikov 08/06/2011, 8:56 AM

## 2011-08-06 NOTE — Progress Notes (Signed)
Occupational Therapy Session Note  Patient Details  Name: Tonya [name] MRN: [id number] Date of Birth: 06/12/1985  Today's Date: 08/06/2011 Time: 1110-1205 Time Calculation (min): 55 min  Short Term Goals: Week 2:  OT Short Term Goal 1 (Week 2): STG = Unmet LTGs  Skilled Therapeutic Interventions:  Self care retraining to include shower, dress and groom.  Focus session on shower in patient's hospital room where the space is smaller than the therapy bathroom shower.  Patient was set up for slide board transfer w/c >< padded tub bench with cutout and supervision for bath and LB dressing. After shower and patient still seated on bench, lateral leans to place pad to slide on with plans to begin placing and removing the pad herself.  Patient in w/c upon arrival and had already gathered her clothes in preparation for ADL task.    Precautions:  Precautions Precautions: Fall Restrictions Weight Bearing Restrictions: Yes RUE Weight Bearing: Weight bearing as tolerated LUE Weight Bearing: Weight bearing as tolerated RLE Weight Bearing: Non weight bearing LLE Weight Bearing: Non weight bearing Pain: 7/10, "I'm sore all over especially my neck and shoulder" (R>L), RN provided medication.  See FIM for current functional status  Therapy/Group: Individual Therapy  [name] 08/06/2011, 12:39 PM

## 2011-08-06 NOTE — Progress Notes (Signed)
ANTICOAGULATION CONSULT NOTE - Follow Up Consult  Pharmacy Consult for Coumadin Indication: VTE prophylaxis  No Known Allergies  Patient Measurements: Height: 5\' 5"  (165.1 cm) Weight: 229 lb 15 oz (104.3 kg) IBW/kg (Calculated) : 57   Vital Signs: Temp: 98.2 F (36.8 C) (07/07 0645) Temp src: Oral (07/07 0645) BP: 128/85 mmHg (07/07 0645) Pulse Rate: 102  (07/07 0645)  Labs:  [name] 08/06/11 0520 08/05/11 0555 08/04/11 0630  HGB 11.2* -- --  HCT 34.9* -- --  PLT 223 -- --  APTT -- -- --  LABPROT 21.2* 20.1* 19.2*  INR 1.80* 1.68* 1.58*  HEPARINUNFRC -- -- --  CREATININE -- -- --  CKTOTAL -- -- --  CKMB -- -- --  TROPONINI -- -- --    Estimated Creatinine Clearance: 127.7 ml/min (by C-G formula based on Cr of 0.68).   Medications:  Scheduled:     . antiseptic oral rinse  15 mL Mouth Rinse QID  . bacitracin  1 application Topical BID  . DULoxetine  60 mg Oral Daily  . enoxaparin (LOVENOX) injection  40 mg Subcutaneous Q24H  . ferrous sulfate  325 mg Oral TID WC  . gabapentin  300 mg Oral BID  . gabapentin  600 mg Oral QHS  . HYDROcodone-acetaminophen  1 tablet Oral QID  . multivitamin with minerals  1 tablet Oral Daily  . OLANZapine  15 mg Oral Daily  . QUEtiapine  100 mg Oral Q2000  . senna-docusate  2 tablet Oral BID  . warfarin  10 mg Oral ONCE-1800  . Warfarin - Pharmacist Dosing Inpatient   Does not apply q1800    Assessment: 25yo female s/p multiple ortho surgeries, on Coumadin for VTE px.  INR subtherapeutic again this morning but trending up. No bleeding noted. H/H low but stable.  Goal of Therapy:  INR 2-3 Monitor platelets by anticoagulation protocol: Yes   Plan:  Coumadin 10mg  today and f/u daily protime. Continue lovenox.   Talbert Cage, PharmD Pager [phone]

## 2011-08-07 LAB — PROTIME-INR
INR: 1.73 — ABNORMAL HIGH (ref 0.00–1.49)
Prothrombin Time: 20.6 seconds — ABNORMAL HIGH (ref 11.6–15.2)

## 2011-08-07 LAB — PREGNANCY, URINE: Preg Test, Ur: NEGATIVE

## 2011-08-07 MED ORDER — WARFARIN SODIUM 2.5 MG PO TABS
12.5000 mg | ORAL_TABLET | Freq: Once | ORAL | Status: AC
  Administered 2011-08-07: 12.5 mg via ORAL
  Filled 2011-08-07: qty 1

## 2011-08-07 MED ORDER — HYDROCERIN EX CREA
TOPICAL_CREAM | Freq: Two times a day (BID) | CUTANEOUS | Status: DC
  Administered 2011-08-07 – 2011-08-18 (×22): via TOPICAL
  Filled 2011-08-07: qty 113

## 2011-08-07 NOTE — Progress Notes (Signed)
Occupational Therapy Weekly Progress Note  Patient Details  Name: Tonya [name] MRN: [id number] Date of Birth: August 18, 1985  Today's Date: 08/07/2011  First Session: Time: 0805-0900 Time Calculation (min): 55 min Second session: Time: 2:40-3:10 Time Calculation (min): 30 min  Precautions:  Precautions Precautions: Fall Restrictions Weight Bearing Restrictions: Yes RUE Weight Bearing: Weight bearing as tolerated LUE Weight Bearing: Weight bearing as tolerated RLE Weight Bearing: Non weight bearing LLE Weight Bearing: Non weight bearing  OT Short Term Goals Week 2: OT Short Term Goal 1 (Week 2): STG = Unmet LTGs OT Short Term Goal 1 - Progress (Week 2): Met Week 3:  OT Short Term Goal 1 (Week 3): LTGs Upgraded to overall Mod I - Supervision  Patient has met all LTGs therefore, new LTGs are now patients STGs.  Patient continues to demonstrate the following deficits: acute pain, limited left elbow flexion and extension, generalized weakness and decreased activity tolerance therefore, will continue to benefit from skilled OT intervention to enhance overall performance with BADL and iADL.  Patient progressing toward revised long term goals.  See plan of care for revisions.  Skilled Therapeutic Interventions: 1) Bed>drop arm commode>W/C><padded tub bench with cut out using slide board.  Patient Supervision/set up for basic transfers and Min assist with Commode and tub bench transfers.  Patient performed bath with supervision while seated on tub bench and utilizing the cut out and lateral leans to bathe perineal areas.  Patient also uses lateral leans for pants up while seated in w/c.  2) Deep tissue massage to neck and upper traps, A/AAROM neck exercises to include providing resistance followed by a stretch.  Patient return demonstrated exercises with VC.  Pain: "Sore all over", RN aware  See FIM for current functional status  Therapy/Group: Individual Therapy  Lanise Mergen,  Hadiya Spoerl 08/07/2011, 9:48 AM

## 2011-08-07 NOTE — Progress Notes (Signed)
Social Work Patient ID: Tonya [name], female   DOB: 12/10/1985, 26 y.o.   MRN: [id number]   Continue to wait for PASARR screening to be completed and to get approval # before I can proceed with bed search.  Her FL2 is prepped and ready to go as soon as this is received.  [name]

## 2011-08-07 NOTE — Progress Notes (Addendum)
Physical Therapy Session Note  Patient Details  Name: Tonya [name] MRN: [id number] Date of Birth: [birth year]  Today's Date: 08/07/2011 Time: 0805-0900 Time Calculation (min): 55 min  Short Term Goals: Week 3:   Unmet LTGs  Skilled Therapeutic Interventions/Progress Updates:  Pain: 0/10, "I don't hurt that bad today"   Treatment focused on pre-prosthetic training and UE ROM. Prone bil. Hip stretch x 3 min. Prone Rt. Hip extension 3 x 10 reps. Supine bil hip extension against rolled towel 3 x 10 reps. Bil. UE curls with 4# dumbbells + stretch of Lt. Elbow at end ranges. Slow progressive elbow flexion stretch with weights while supine, Lt. elbow on pillows. Bil. UE hang from railing working Lt. Elbow extension. All sliding board transfers supervision level.   Second Session Skilled Therapeutic Interventions/Progress Updates:  Time:  2130-8657 Time Calculation (min):42 min Pain: 6/10 soft tissue pain at max with stretch, pt making call of when to relax stretch. Pt feeling that she needed to go outside. Wheelchair mobility to solarium and back varying speeds for strength and endurance, modified independent. Performed resisted UE rows (with orange band) 3 x 10 reps, wheelchair pushups 3 x 6 reps. Lt. Elbow manual stretch performed by therapist with contract relax to decrease guarding. Educated pt on self stretch of Lt. Elbow by utilizing wheelchair.    Therapy Documentation Precautions:  Precautions Precautions: Fall Restrictions Weight Bearing Restrictions: Yes RUE Weight Bearing: Weight bearing as tolerated LUE Weight Bearing: Weight bearing as tolerated RLE Weight Bearing: Non weight bearing LLE Weight Bearing: Non weight bearing   See FIM for current functional status  Therapy/Group: Individual Therapy both sessions  [name] 08/07/2011, 10:42 AM

## 2011-08-07 NOTE — Progress Notes (Signed)
Patient ID: Tonya [name], female   DOB: 02/20/1985, 26 y.o.   MRN: [id number] Patient ID: Tonya [name], female   DOB: [birth year], 26 y.o.   MRN: [id number]  Subjective/Complaints:  Neck pain especially at night. Left arm tight .Marland KitchenMarland KitchenA 12 point review of systems has been performed and if not noted above is otherwise negative.    BP Readings from Last 3 Encounters:  08/07/11 125/86  07/19/11 123/64  07/19/11 123/64   Lab Results  Component Value Date   INR 1.73* 08/07/2011   INR 1.80* 08/06/2011   INR 1.68* 08/05/2011     Objective: Vital Signs: Blood pressure 125/86, pulse 110, temperature 98.9 F (37.2 C), temperature source Oral, resp. rate 18, height 5\' 5"  (1.651 m), weight 104.3 kg (229 lb 15 oz), last menstrual period 06/23/2011, SpO2 98.00%. No results found.  Basename 08/06/11 0520  WBC 6.5  HGB 11.2*  HCT 34.9*  PLT 223   No results found for this basename: NA:2,K:2,CL:2,CO2:2,GLUCOSE:2,BUN:2,CREATININE:2,CALCIUM:2 in the last 72 hours CBG (last 3)  No results found for this basename: GLUCAP:3 in the last 72 hours  Wt Readings from Last 3 Encounters:  08/03/11 104.3 kg (229 lb 15 oz)  07/17/11 106.958 kg (235 lb 12.8 oz)  07/17/11 106.958 kg (235 lb 12.8 oz)    Physical Exam:  General appearance: alert, cooperative, appears stated age and no distress Head: Normocephalic, without obvious abnormality, atraumatic Eyes: conjunctivae/corneas clear. PERRL, EOM's intact. Ears: normal external ear canals both ears Nose: Nares normal. Septum midline. Mucosa normal. No drainage or sinus tenderness. Throat: lips, mucosa, and tongue normal; teeth and gums normal Neck: no adenopathy, no carotid bruit, no JVD, supple, symmetrical, trachea midline and thyroid not enlarged, symmetric, no tenderness/mass/nodules Back: symmetric, no curvature. ROM normal. No CVA tenderness. Resp: clear to auscultation bilaterally Cardio:  tachy regular  rhythm, S1, S2 normal, no murmur,  click, rub or gallop GI: soft, non-tender; bowel sounds normal; no masses,  no organomegaly Extremities: extremities normal, atraumatic, no cyanosis or edema Pulses: 2+ and symmetric Skin: Skin color, texture, turgor normal. No rashes or lesions Neurologic: Grossly normal Incision/Wound: multiple wounds with eschar and granulation. Minimal drainage. . Sutures removed from left AKA. Mild serous drainage. Wound approximated Psych: less pressured speech and anxiety. More redirectable. Better awareness overall. M/S: -20 full extension at left elbow (resisting extension on exam?). Generalized cervical tenderness  Assessment/Plan: 1. Functional deficits secondary to traumatic right AKA, left tib-fib fx, left 2mc fx, multiple soft-tissue trauma which require 3+ hours per day of interdisciplinary therapy in a comprehensive inpatient rehab setting. Physiatrist is providing close team supervision and 24 hour management of active medical problems listed below. Physiatrist and rehab team continue to assess barriers to discharge/monitor patient progress toward functional and medical goals.  Still looking at SNF placement FIM: FIM - Bathing Bathing Steps Patient Completed: Chest;Right Arm;Left Arm;Abdomen;Front perineal area;Right upper leg;Left upper leg;Left lower leg (including foot);Buttocks Bathing: 6: More than reasonable amount of time  FIM - Upper Body Dressing/Undressing Upper body dressing/undressing steps patient completed: Thread/unthread right bra strap;Thread/unthread left bra strap;Hook/unhook bra;Thread/unthread right sleeve of pullover shirt/dresss;Thread/unthread left sleeve of pullover shirt/dress;Put head through opening of pull over shirt/dress;Pull shirt over trunk Upper body dressing/undressing: 6: More than reasonable amount of time FIM - Lower Body Dressing/Undressing Lower body dressing/undressing steps patient completed: Thread/unthread right underwear leg;Thread/unthread left  underwear leg;Pull underwear up/down;Thread/unthread right pants leg;Thread/unthread left pants leg;Pull pants up/down;Don/Doff left sock Lower body dressing/undressing: 5: Supervision: Safety issues/verbal cues  FIM - Toileting Toileting steps completed by patient: Adjust clothing prior to toileting;Performs perineal hygiene;Adjust clothing after toileting Toileting Assistive Devices: Grab bar or rail for support Toileting: 6: More than reasonable amount of time  FIM - Diplomatic Services operational officer Devices: Human resources officer Transfers: 5-To toilet/BSC: Supervision (verbal cues/safety issues);5-From toilet/BSC: Supervision (verbal cues/safety issues)  FIM - Banker Devices: Sliding board Bed/Chair Transfer: 5: Bed > Chair or W/C: Supervision (verbal cues/safety issues);5: Chair or W/C > Bed: Supervision (verbal cues/safety issues)  FIM - Locomotion: Wheelchair Distance: >200' Locomotion: Wheelchair: 6: Travels 150 ft or more, turns around, maneuvers to table, bed or toilet, negotiates 3% grade: maneuvers on rugs and over door sills independently FIM - Locomotion: Ambulation Locomotion: Ambulation: 0: Activity did not occur (NA)  Comprehension Comprehension Mode: Auditory Comprehension: 5-Follows basic conversation/direction: With no assist  Expression Expression Mode: Verbal Expression: 5-Expresses basic needs/ideas: With extra time/assistive device  Social Interaction Social Interaction: 6-Interacts appropriately with others with medication or extra time (anti-anxiety, antidepressant).  Problem Solving Problem Solving: 5-Solves complex 90% of the time/cues < 10% of the time  Memory Memory: 6-More than reasonable amt of time  1. Polytrauma after suicide attempt 06/27/2011  2. DVT Prophylaxis/Anticoagulation: Coumadin. Venous Doppler study 07/07/2011 negative. Monitor platelet counts any signs of  bleeding  -resumed lovenox 40 qday for decreased INR- still not therapeutic as of yesterday 3. Pain management. Wean meds.. Monitor with increased mobility. neurontin for phantom limb pain to help with mood stabilization.   -heat for cervicalgia- needs to work on posture also  -ROM for neck and left elbow 4. Right AKA 06/27/2011. Dressing changes as advised- wearing shrinker garment at present  -dry dressing to stump. Slight serous drainage noted. 5. Severely comminuted left tibia-fibula fracture. ORIF/nonweightbearing  6. Left second metacarpal fracture and right forearm fifth digit extensor tendon injury status post repair. Weightbearing as tolerated. She is now out of the splint. May benefit from a small wrist splint for support.  -ordered gloves for wheelchair. 7. Bipolar/mania:  -improved affect and awareness  -seroquel 100mg  qhs, zyprexa 15mg  qam- pt mentions something about psych coming by and suggesting a decreased seroquel dose, but i don't see it. She's doing much better and i'm not inclined to change things heading into the weekend.  - Continue cymbalta at 60mg   -appreciate Dr. Blenda Peals help and Dr. Maxwell Marion input  -continue neurontin 300mg  bid and 600 qhs)--this has helped with pain also  -cogentin dc'd    LOS (Days) 19 A FACE TO FACE EVALUATION WAS PERFORMED  Felicitas Sine T 08/07/2011, 7:07 AM

## 2011-08-07 NOTE — Progress Notes (Signed)
ANTICOAGULATION CONSULT NOTE - Follow Up Consult  Pharmacy Consult for Coumadin Indication: VTE prophylaxis  No Known Allergies  Patient Measurements: Height: 5\' 5"  (165.1 cm) Weight: 229 lb 15 oz (104.3 kg) IBW/kg (Calculated) : 57   Vital Signs: Temp: 98.9 F (37.2 C) (07/08 0610) Temp src: Oral (07/08 0610) BP: 125/86 mmHg (07/08 0610) Pulse Rate: 110  (07/08 0610)  Labs:  Basename 08/07/11 0525 08/06/11 0520 08/05/11 0555  HGB -- 11.2* --  HCT -- 34.9* --  PLT -- 223 --  APTT -- -- --  LABPROT 20.6* 21.2* 20.1*  INR 1.73* 1.80* 1.68*  HEPARINUNFRC -- -- --  CREATININE -- -- --  CKTOTAL -- -- --  CKMB -- -- --  TROPONINI -- -- --    Estimated Creatinine Clearance: 127.7 ml/min (by C-G formula based on Cr of 0.68).   Medications:  Scheduled:     . antiseptic oral rinse  15 mL Mouth Rinse QID  . bacitracin  1 application Topical BID  . DULoxetine  60 mg Oral Daily  . enoxaparin (LOVENOX) injection  40 mg Subcutaneous Q24H  . ferrous sulfate  325 mg Oral TID WC  . gabapentin  300 mg Oral BID  . gabapentin  600 mg Oral QHS  . hydrocerin   Topical BID  . HYDROcodone-acetaminophen  1 tablet Oral QID  . multivitamin with minerals  1 tablet Oral Daily  . OLANZapine  15 mg Oral Daily  . QUEtiapine  100 mg Oral Q2000  . senna-docusate  2 tablet Oral BID  . warfarin  10 mg Oral ONCE-1800  . Warfarin - Pharmacist Dosing Inpatient   Does not apply q1800    Assessment: 25yo female s/p multiple ortho surgeries, on Coumadin for VTE px.  INR subtherapeutic again this morning - slightly decreased from yesterday.  All doses appear appropriately given/charted.  No bleeding noted. H/H from 7/7 noted to be low but stable.  Goal of Therapy:  INR 2-3 Monitor platelets by anticoagulation protocol: Yes   Plan:  -Increase Coumadin to 12.5 mg today -f/u daily PT/INR -Continue lovenox for DVT pxl until INR at goal  Tyrena Gohr L. Illene Bolus, PharmD, BCPS Clinical  Pharmacist Pager: [phone] 08/07/2011 12:27 PM

## 2011-08-08 LAB — PROTIME-INR: INR: 2.35 — ABNORMAL HIGH (ref 0.00–1.49)

## 2011-08-08 MED ORDER — WARFARIN SODIUM 10 MG PO TABS
10.0000 mg | ORAL_TABLET | Freq: Once | ORAL | Status: AC
  Administered 2011-08-08: 10 mg via ORAL
  Filled 2011-08-08: qty 1

## 2011-08-08 NOTE — Progress Notes (Signed)
Nutrition Follow-up  Intervention:   No further interventions at this time.  Pt is comfortable managing nutrition needs, intake, and Vitamin K intake for coumadin levels. RD to sign off.  Instructed pt on how to request a return visit from RD if needed in the future.  Assessment:   Pt reports continued good intake of 100% of meals consistently.  She also reports consuming snacks (peanut butter and crackers) between meals, and snacks from bedside at night.  Pt is comfortable managing her nutrition needs.  Education needs r/t Vitamin K intake addressed.  Pt has a diet therapy plan in place for consistent Vitamin K intake. No other nutrition related concerns at this time.  Diet Order:  Regular, thin  Meds: Scheduled Meds:   . antiseptic oral rinse  15 mL Mouth Rinse QID  . bacitracin  1 application Topical BID  . DULoxetine  60 mg Oral Daily  . ferrous sulfate  325 mg Oral TID WC  . gabapentin  300 mg Oral BID  . gabapentin  600 mg Oral QHS  . hydrocerin   Topical BID  . HYDROcodone-acetaminophen  1 tablet Oral QID  . multivitamin with minerals  1 tablet Oral Daily  . OLANZapine  15 mg Oral Daily  . QUEtiapine  100 mg Oral Q2000  . senna-docusate  2 tablet Oral BID  . warfarin  10 mg Oral ONCE-1800  . warfarin  12.5 mg Oral ONCE-1800  . Warfarin - Pharmacist Dosing Inpatient   Does not apply q1800  . DISCONTD: enoxaparin (LOVENOX) injection  40 mg Subcutaneous Q24H   Continuous Infusions:  PRN Meds:.acetaminophen, albuterol, food thickener, HYDROcodone-acetaminophen, LORazepam, ondansetron (ZOFRAN) IV, ondansetron, polyethylene glycol, sodium chloride, sorbitol  Labs:  No recent CMP.  Last labs (6/20)   Intake/Output Summary (Last 24 hours) at 08/08/11 1046 Last data filed at 08/08/11 0925  Gross per 24 hour  Intake    720 ml  Output      0 ml  Net    720 ml   Last BM (7/8).  Previously reported constipation, now resolved with bowel regimen.  Weight Status:  Stable at 229  lbs Admission wt: 228 lb  Re-statement of needs: 2100-2200, 100-115g protein    No new nutrition-related dx.  No interventions warranted at this time.  Monitor:   1. Food/Beverage; pt to consume at least 75% of meals.  Met, pt consuming 100% consistently with additional snacks. 2.  Wt/wt change; monitor trends.  Met. 3.  Gastrointestinal; stool output wnl.  Met, last BM yesterday (7/8)   Loyce Dys, MS RD LDN Clinical Inpatient Dietitian Pager: [phone]

## 2011-08-08 NOTE — Progress Notes (Signed)
ANTICOAGULATION CONSULT NOTE - Follow Up Consult  Pharmacy Consult for Coumadin Indication: VTE prophylaxis  No Known Allergies  Patient Measurements: Height: 5\' 5"  (165.1 cm) Weight: 229 lb 15 oz (104.3 kg) IBW/kg (Calculated) : 57   Vital Signs: Temp: 97.6 F (36.4 C) (07/09 0500) Temp src: Oral (07/09 0500) BP: 123/72 mmHg (07/09 0500) Pulse Rate: 93  (07/09 0500)  Labs:  [name] 08/08/11 0635 08/07/11 0525 08/06/11 0520  HGB -- -- 11.2*  HCT -- -- 34.9*  PLT -- -- 223  APTT -- -- --  LABPROT 26.1* 20.6* 21.2*  INR 2.35* 1.73* 1.80*  HEPARINUNFRC -- -- --  CREATININE -- -- --  CKTOTAL -- -- --  CKMB -- -- --  TROPONINI -- -- --    Estimated Creatinine Clearance: 127.7 ml/min (by C-G formula based on Cr of 0.68).   Medications:  Scheduled:     . antiseptic oral rinse  15 mL Mouth Rinse QID  . bacitracin  1 application Topical BID  . DULoxetine  60 mg Oral Daily  . ferrous sulfate  325 mg Oral TID WC  . gabapentin  300 mg Oral BID  . gabapentin  600 mg Oral QHS  . hydrocerin   Topical BID  . HYDROcodone-acetaminophen  1 tablet Oral QID  . multivitamin with minerals  1 tablet Oral Daily  . OLANZapine  15 mg Oral Daily  . QUEtiapine  100 mg Oral Q2000  . senna-docusate  2 tablet Oral BID  . warfarin  10 mg Oral ONCE-1800  . warfarin  12.5 mg Oral ONCE-1800  . Warfarin - Pharmacist Dosing Inpatient   Does not apply q1800  . DISCONTD: enoxaparin (LOVENOX) injection  40 mg Subcutaneous Q24H    Assessment: 26yo female s/p multiple ortho surgeries, on Coumadin for VTE px.  INR is at goal now after large jump from yesterday. No bleeding noted. H/H from 7/7 noted to be low but stable.  Goal of Therapy:  INR 2-3 Monitor platelets by anticoagulation protocol: Yes   Plan:  1. Coumadin 10mg  PO x 1 tonight 2. F/u AM INR  Lysle Pearl, PharmD, BCPS Pager # [phone] 08/08/2011 9:24 AM

## 2011-08-08 NOTE — Progress Notes (Signed)
Subjective/Complaints:  Slept well. Feels "achy" this am.  .Marland KitchenMarland KitchenA 12 point review of systems has been performed and if not noted above is otherwise negative.    BP Readings from Last 3 Encounters:  08/08/11 123/72  07/19/11 123/64  07/19/11 123/64   Lab Results  Component Value Date   INR 2.35* 08/08/2011   INR 1.73* 08/07/2011   INR 1.80* 08/06/2011     Objective: Vital Signs: Blood pressure 123/72, pulse 93, temperature 97.6 F (36.4 C), temperature source Oral, resp. rate 19, height 5\' 5"  (1.651 m), weight 104.3 kg (229 lb 15 oz), last menstrual period 06/23/2011, SpO2 95.00%. No results found.  Basename 08/06/11 0520  WBC 6.5  HGB 11.2*  HCT 34.9*  PLT 223   No results found for this basename: NA:2,K:2,CL:2,CO2:2,GLUCOSE:2,BUN:2,CREATININE:2,CALCIUM:2 in the last 72 hours CBG (last 3)  No results found for this basename: GLUCAP:3 in the last 72 hours  Wt Readings from Last 3 Encounters:  08/03/11 104.3 kg (229 lb 15 oz)  07/17/11 106.958 kg (235 lb 12.8 oz)  07/17/11 106.958 kg (235 lb 12.8 oz)    Physical Exam:  General appearance: alert, cooperative, appears stated age and no distress Head: Normocephalic, without obvious abnormality, atraumatic Eyes: conjunctivae/corneas clear. PERRL, EOM's intact. Ears: normal external ear canals both ears Nose: Nares normal. Septum midline. Mucosa normal. No drainage or sinus tenderness. Throat: lips, mucosa, and tongue normal; teeth and gums normal Neck: no adenopathy, no carotid bruit, no JVD, supple, symmetrical, trachea midline and thyroid not enlarged, symmetric, no tenderness/mass/nodules Back: symmetric, no curvature. ROM normal. No CVA tenderness. Resp: clear to auscultation bilaterally Cardio:  tachy regular  rhythm, S1, S2 normal, no murmur, click, rub or gallop GI: soft, non-tender; bowel sounds normal; no masses,  no organomegaly Extremities: extremities normal, atraumatic, no cyanosis or edema Pulses: 2+ and  symmetric Skin: Skin color, texture, turgor normal. No rashes or lesions Neurologic: Grossly normal Incision/Wound: multiple wounds with eschar and granulation. Minimal drainage. . Sutures removed from left AKA. Mild serous drainage. Wound approximated Psych: less pressured speech and anxiety. More redirectable. Better awareness overall. M/S: -20 full extension at left elbow (resisting extension on exam?). Generalized cervical tenderness  Assessment/Plan: 1. Functional deficits secondary to traumatic right AKA, left tib-fib fx, left 2mc fx, multiple soft-tissue trauma which require 3+ hours per day of interdisciplinary therapy in a comprehensive inpatient rehab setting. Physiatrist is providing close team supervision and 24 hour management of active medical problems listed below. Physiatrist and rehab team continue to assess barriers to discharge/monitor patient progress toward functional and medical goals.  Still looking at SNF placement FIM: FIM - Bathing Bathing Steps Patient Completed: Chest;Right Arm;Left Arm;Abdomen;Front perineal area;Right upper leg;Left upper leg;Left lower leg (including foot);Buttocks Bathing: 6: More than reasonable amount of time  FIM - Upper Body Dressing/Undressing Upper body dressing/undressing steps patient completed: Thread/unthread right bra strap;Thread/unthread left bra strap;Hook/unhook bra;Thread/unthread right sleeve of pullover shirt/dresss;Thread/unthread left sleeve of pullover shirt/dress;Put head through opening of pull over shirt/dress;Pull shirt over trunk Upper body dressing/undressing: 6: More than reasonable amount of time FIM - Lower Body Dressing/Undressing Lower body dressing/undressing steps patient completed: Thread/unthread right underwear leg;Thread/unthread left underwear leg;Pull underwear up/down;Thread/unthread right pants leg;Thread/unthread left pants leg;Pull pants up/down;Don/Doff left sock Lower body dressing/undressing: 4:  Min-Patient completed 75 plus % of tasks (requires assist when wearing tight pants)  FIM - Toileting Toileting steps completed by patient: Adjust clothing prior to toileting;Performs perineal hygiene;Adjust clothing after toileting Toileting Assistive Devices: Grab bar or  rail for support Toileting: 6: More than reasonable amount of time  FIM - Diplomatic Services operational officer Devices: Human resources officer Transfers: 5-Set-up assist to: Apply orthosis/W/C setup  FIM - Banker Devices: Sliding board Bed/Chair Transfer: 6: Supine > Sit: No assist;6: Sit > Supine: No assist;5: Bed > Chair or W/C: Supervision (verbal cues/safety issues);5: Chair or W/C > Bed: Supervision (verbal cues/safety issues)  FIM - Locomotion: Wheelchair Distance: >1000' Locomotion: Wheelchair: 6: Travels 150 ft or more, turns around, maneuvers to table, bed or toilet, negotiates 3% grade: maneuvers on rugs and over door sills independently FIM - Locomotion: Ambulation Locomotion: Ambulation: 0: Activity did not occur (NA)  Comprehension Comprehension Mode: Auditory Comprehension: 5-Follows basic conversation/direction: With no assist  Expression Expression Mode: Verbal Expression: 5-Expresses complex 90% of the time/cues < 10% of the time  Social Interaction Social Interaction: 6-Interacts appropriately with others with medication or extra time (anti-anxiety, antidepressant).  Problem Solving Problem Solving: 5-Solves complex 90% of the time/cues < 10% of the time  Memory Memory: 6-More than reasonable amt of time  1. Polytrauma after suicide attempt 06/27/2011  2. DVT Prophylaxis/Anticoagulation: Coumadin. Venous Doppler study 07/07/2011 negative. Monitor platelet counts any signs of bleeding  -dc lovenox 3. Pain management. Wean meds.. Monitor with increased mobility. neurontin for phantom limb pain to help with mood stabilization.    -heat for cervicalgia- needs to work on posture also  -ROM for neck and left elbow 4. Right AKA 06/27/2011. Dressing changes as advised- wearing shrinker garment at present  -dry dressing to stump. Slight serous drainage noted. 5. Severely comminuted left tibia-fibula fracture. ORIF/nonweightbearing  6. Left second metacarpal fracture and right forearm fifth digit extensor tendon injury status post repair. Weightbearing as tolerated. She is now out of the splint. May benefit from a small wrist splint for support.  -ordered gloves for wheelchair. 7. Bipolar/mania:  -improved affect and awareness  -seroquel 100mg  qhs, zyprexa 15mg  qam- pt mentions something about psych coming by and suggesting a decreased seroquel dose, but i don't see it. She's doing much better and i'm not inclined to change things heading into the weekend.  - Continue cymbalta at 60mg   -appreciate Dr. Blenda Peals help and Dr. Maxwell Marion input  -continue neurontin 300mg  bid and 600 qhs)--this has helped with pain also  -cogentin dc'd    LOS (Days) 20 A FACE TO FACE EVALUATION WAS PERFORMED  SWARTZ,ZACHARY T 08/08/2011, 8:03 AM

## 2011-08-08 NOTE — Progress Notes (Signed)
Physical Therapy Session Note  Patient Details  Name: Tonya [name] MRN: [id number] Date of Birth: Oct 15, 1985  Today's Date: 08/08/2011 Time: 4098-1191 Time Calculation (min): 58 min  Short Term Goals: Week 3:   = Unmet LTGs  Skilled Therapeutic Interventions/Progress Updates:  No c/o pain   Manual stretch to Lt. Elbow, contract relax technique to minimize muscle guarding. Followed stretch with activities to utilize gained motion. Recreational therapist facilitating activities to promote core strength, functional use of Lt. UE and increase overall mobility. Performed partial sit ups with weighted ball toss to/from trampoline + cognitive challenge. Pt participated in [employer] utilizing Lt. UE to toss ball. Pt retrieved balls from floor while seated in wheelchair to work on safe reaching. Bil. UE resisted exercises with green band: rows, external shoulder rotation, wheelchair pushups 2 x 10 reps each.   Second Session Skilled Therapeutic Interventions/Progress Updates:  Time:  4782-9562 Time Calculation (min): 45 min Pain: no c/o pain Once in gym pt had to go to the bathroom, returned to room pt performing sliding board transfer to/from bedside commode and perineal cleaning with supervision. Rest of session pt performed LE exercise program 2 x 10 reps each, provided manual resistance for increased difficulty. Practiced desensitization of Rt. Residual limb with towel to decrease phantom pains.  Encouraged pt to perform LE exercise once more before going to bed.  Therapy Documentation Precautions:  Precautions Precautions: Fall Restrictions Weight Bearing Restrictions: Yes RUE Weight Bearing: Weight bearing as tolerated LUE Weight Bearing: Weight bearing as tolerated RLE Weight Bearing: Non weight bearing LLE Weight Bearing: Non weight bearing  Pain: Pain Assessment Pain Assessment: No/denies pain  See FIM for current functional status  Therapy/Group: Individual  Therapy  [name] 08/08/2011, 12:30 PM

## 2011-08-08 NOTE — Progress Notes (Signed)
Recreational Therapy Session Note  Patient Details  Name: Misk Eddye Broxterman MRN: [id number] Date of Birth: [birth year] Today's Date: 08/08/2011 Time:  930-10 Pain: no c/o Skilled Therapeutic Interventions/Progress Updates: w/c mobility on unit using BUE's with supervision. Stomach crunches on the mat with a wedge- using weighted ball with rebounder while naming items in a category (named items found in a grocery store using the last letter of the previous word to start the new word)  Bocce ball seated w/c level using LUE for ball toss.  Retrieved balls from floor working on safety awareness and L elbow extension with supervision. Therapy/Group: Co-Treatment    Brandonn Capelli 08/08/2011, 11:19 AM

## 2011-08-08 NOTE — Progress Notes (Signed)
PSYCHOLOGY  Name:  Tonya [name]  MRN:   [id number]  Date of Birth:  1985-03-19 Date:   08/08/11  No new issues or concerns reported. Her mood seemed a bit subdued.   Gladstone Pih, Ph.D

## 2011-08-08 NOTE — Progress Notes (Signed)
Occupational Therapy Session Note  Patient Details  Name: Tonya [name] MRN: [id number] Date of Birth: 07-Mar-1985  Today's Date: 08/08/2011  First Session: Time: 0800-0900 Time Calculation (min): 60 min  Second Session: Time: 2:35 - 3 :05 Time Calculation (min): 30 min  Precautions:  Precautions: Fall Weight Bearing Restrictions: Yes RUE Weight Bearing: Weight bearing as tolerated LUE Weight Bearing: Weight bearing as tolerated RLE Weight Bearing: Non weight bearing LLE Weight Bearing: Non weight bearing  Short Term Goals: Week 3:  OT Short Term Goal 1 (Week 3): LTGs Upgraded to overall Mod I - Supervision   Skilled Therapeutic Interventions:  1) Patient's fiancee present initially yet excused himself and left the room.  Self care retraining to include drop arm commode transfer via slide board with vcs to place board only if her shorts are adjusted so there is no skin from her thighs or bottom to come in contact with the board.  Patient decided to perform the transfer anyway therefore, the SB moved as she moved.  No near fall or issues this time.  Patient requested change position of tub bench secondary to "feeling cramped".  Position resulted in challenging slide board transfer.  VCs for need to position her buttocks so as not to slide it across the tire of the W/C.  Patient often laughs off vcs for example, "I just have a big bootie".  Reviewed upgraded LTGs briefly yesterday and in more detail today.  Patient agreeable with the "challenge".  Reviewed goals posted up on her wall set early in her CIR stay.  Patient and this clinician both agree that these goals are no longer applicable and they have been removed.  2) Patient's fiancee present initially yet excused himself and left the room.  W/C>bed via SB and supervision.  Patient supine for left elbow PROM/stretching secondary to does not have full A/PROM in flexion or extension.  Able to improve extension after session.  Patient  resting on her left side with left arm extended to allow gravity to assist with stretching.  Pain: No c/o of pain  See FIM for current functional status  Therapy/Group: Individual Therapy  Coda Filler 08/08/2011, 9:09 AM

## 2011-08-09 DIAGNOSIS — IMO0002 Reserved for concepts with insufficient information to code with codable children: Secondary | ICD-10-CM

## 2011-08-09 DIAGNOSIS — Z5189 Encounter for other specified aftercare: Secondary | ICD-10-CM

## 2011-08-09 DIAGNOSIS — S82109A Unspecified fracture of upper end of unspecified tibia, initial encounter for closed fracture: Secondary | ICD-10-CM

## 2011-08-09 DIAGNOSIS — S82839A Other fracture of upper and lower end of unspecified fibula, initial encounter for closed fracture: Secondary | ICD-10-CM

## 2011-08-09 MED ORDER — WARFARIN SODIUM 10 MG PO TABS
10.0000 mg | ORAL_TABLET | Freq: Once | ORAL | Status: AC
  Administered 2011-08-09: 10 mg via ORAL
  Filled 2011-08-09: qty 1

## 2011-08-09 MED ORDER — BARRIER CREAM NON-SPECIFIED
1.0000 "application " | TOPICAL_CREAM | Freq: Two times a day (BID) | TOPICAL | Status: DC | PRN
  Filled 2011-08-09: qty 1

## 2011-08-09 NOTE — Progress Notes (Signed)
ANTICOAGULATION CONSULT NOTE - Follow Up Consult  Pharmacy Consult for Coumadin Indication: VTE prophylaxis  No Known Allergies  Patient Measurements: Height: 5\' 5"  (165.1 cm) Weight: 228 lb 9.9 oz (103.7 kg) IBW/kg (Calculated) : 57   Vital Signs: Temp: 97.9 F (36.6 C) (07/10 0500) Temp src: Oral (07/10 0500) BP: 117/78 mmHg (07/10 0500) Pulse Rate: 96  (07/10 0500)  Labs:  Basename 08/09/11 1610 08/08/11 0635 08/07/11 0525  HGB -- -- --  HCT -- -- --  PLT -- -- --  APTT -- -- --  LABPROT 26.9* 26.1* 20.6*  INR 2.44* 2.35* 1.73*  HEPARINUNFRC -- -- --  CREATININE -- -- --  CKTOTAL -- -- --  CKMB -- -- --  TROPONINI -- -- --    Estimated Creatinine Clearance: 127.3 ml/min (by C-G formula based on Cr of 0.68).   Medications:  Scheduled:     . antiseptic oral rinse  15 mL Mouth Rinse QID  . bacitracin  1 application Topical BID  . DULoxetine  60 mg Oral Daily  . ferrous sulfate  325 mg Oral TID WC  . gabapentin  300 mg Oral BID  . gabapentin  600 mg Oral QHS  . hydrocerin   Topical BID  . HYDROcodone-acetaminophen  1 tablet Oral QID  . multivitamin with minerals  1 tablet Oral Daily  . OLANZapine  15 mg Oral Daily  . QUEtiapine  100 mg Oral Q2000  . senna-docusate  2 tablet Oral BID  . warfarin  10 mg Oral ONCE-1800  . Warfarin - Pharmacist Dosing Inpatient   Does not apply q1800    Assessment: 25yo female s/p multiple ortho surgeries, on Coumadin for VTE px.  INR is at goal now after large jump from yesterday. No bleeding noted. H/H from 7/7 noted to be low but stable.  Goal of Therapy:  INR 2-3 Monitor platelets by anticoagulation protocol: Yes   Plan:  1. Coumadin 10mg  PO x 1 tonight 2. F/u AM INR  Juliette Alcide, PharmD, BCPS.  Pager: [phone]  08/09/2011 12:09 PM

## 2011-08-09 NOTE — Progress Notes (Signed)
Occupational Therapy Session Note  Patient Details  Name: Tonya [name] MRN: [id number] Date of Birth: 08/16/1985  Today's Date: 08/09/2011 Time: 8413-2440 Time Calculation (min): 45 min  Short Term Goals: Week 3:  OT Short Term Goal 1 (Week 3): LTGs Upgraded to overall Mod I - Supervision   Skilled Therapeutic Interventions/Progress Updates:  Patient's fiancee present initially then excused himself once session began.  Self care retraining to include bed>w/c><tub bench with steady assist, shower and groom.  Time did not allow for this clinician to stay for dressing therefore, reviewed techniques we have used to be successful and safe in the past.  Patient reported feeling comfortable to complete dressing with OT present and would call for assistance if needed.  Patient agreed to report back to this clinician on how many lateral leans it took to pull up her underwear and pants.  Precautions:  Precautions Precautions: Fall Restrictions Weight Bearing Restrictions: Yes RUE Weight Bearing: Weight bearing as tolerated LUE Weight Bearing: Weight bearing as tolerated RLE Weight Bearing: Non weight bearing LLE Weight Bearing: Non weight bearing  Pain: Pain Assessment Pain Assessment: No/denies pain Pain Score:   8 Pain Type: Acute pain Pain Location: Generalized (leg, neck, back, arms) Pain Descriptors: Aching Pain Frequency: Occasional Pain Onset: Awakened from sleep Patients Stated Pain Goal: 2 Pain Intervention(s): Medication (See eMAR)  See FIM for current functional status  Therapy/Group: Individual Therapy  Davine Sweney 08/09/2011, 9:06 AM
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
## 2011-08-09 NOTE — Progress Notes (Signed)
Physical Therapy Note  Patient Details  Name: Tonya [name] MRN: [id number] Date of Birth: 1985/04/07 Today's Date: 08/09/2011  0900-0950 (50 minutes) individual Pain: no complaint of pain Focus of treatment: contract/relax Lt elbow to increase active elbow extension; bilateral LE/AROM strengthening including LT AKA exercises X 15  In prone /supine ; bilateral UE Strengthening using green theraband (scapular retraction, bicep curls , shoulder depression, elbow extension), trunk rotation strengthening using green theraband X 15, UE ergonometer Level 3 Random program X 10 minutes.; wc pushups X 10. Transfers wc>mat SBA sliding board.   Guthrie Lemme,JIM 08/09/2011, 9:35 AM
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
## 2011-08-09 NOTE — Progress Notes (Signed)
Occupational Therapy Session Note  Patient Details  Name: Tonya [name] MRN: [id number] Date of Birth: 1985-08-09  Today's Date: 08/09/2011 Time: 6295-2841 Time Calculation (min): 110 min  Skilled Therapeutic Interventions:  [employer] - please refer to Outing goal and intervention sheet located in shadow chart for details.   Patient reported overall pain and soreness after outing because she propelled w/c and negotiated numerous obstacles.  Precautions:  Precautions Precautions: Fall Restrictions Weight Bearing Restrictions: Yes RUE Weight Bearing: Weight bearing as tolerated LUE Weight Bearing: Weight bearing as tolerated RLE Weight Bearing: Non weight bearing LLE Weight Bearing: Non weight bearing  Therapy/Group: Individual Therapy and Group Therapy  Arian Mcquitty 08/09/2011, 5:22 PM

## 2011-08-09 NOTE — Progress Notes (Signed)
Recreational Therapy Session Note  Patient Details  Name: Tonya [name] MRN: [id number] Date of Birth: 1985/06/17 Today's Date: 08/09/2011 Time:  2956-2130 Pain: no c/o Skilled Therapeutic Interventions/Progress Updates: Pt participated in community reintegration/outing w/c level to [employer] propelling w/c on even/uneven indoor/outdoor surfaces, identifying and negotiating obstacles, problem solving public restroom access and how to retrieve and carry items throughout the store. Discussed energy conservation techniques as well. Therapy/Group: Community Reintegration  Activity Level: Moderate:  Level of assist: Supervision  Burel Kahre 08/09/2011, 5:21 PM

## 2011-08-10 LAB — PROTIME-INR: INR: 2.64 — ABNORMAL HIGH (ref 0.00–1.49)

## 2011-08-10 MED ORDER — WARFARIN SODIUM 10 MG PO TABS
10.0000 mg | ORAL_TABLET | Freq: Once | ORAL | Status: AC
  Administered 2011-08-10: 10 mg via ORAL
  Filled 2011-08-10: qty 1

## 2011-08-10 NOTE — Progress Notes (Signed)
Occupational Therapy Session Note  Patient Details  Name: Tonya [name] MRN: [id number] Date of Birth: 1985/10/03  Today's Date: 08/10/2011 Time: 1020-1100 Time Calculation (min): 40 min    Skilled Therapeutic Interventions/Progress Updates: Patient seen this am for bathing and dressing.  Patient wanted to shower after BM and concerned about limited time.  Explained that I was happy to assist, and patient would just need to push herself to get done on time.  Patient remained very focused on end goal of timely completion of tasks.  Patient able to direct session, and needed only minimal assist to place towel underneath her thighs prior to slide board transfer back to wheelchair after shower.  Assistance given to position wheelchair for transfer secondary to time constraints.  Patient stated goal was to be able to complete lower body dressing with no more than "4 or  6" lateral leans to pull up clothing.       Therapy Documentation Precautions:  Precautions Precautions: Fall Restrictions Weight Bearing Restrictions: Yes RUE Weight Bearing: Weight bearing as tolerated LUE Weight Bearing: Weight bearing as tolerated RLE Weight Bearing: Non weight bearing LLE Weight Bearing: Non weight bearing    Pain:  Patient states no pain, just recently took pain medicine.   See FIM for current functional status  Therapy/Group: Individual Therapy  Collier Salina 08/10/2011, 11:04 AM

## 2011-08-10 NOTE — Progress Notes (Signed)
Pt sleepy this eve. Asleep after last therapy session,awakens easily. States "ache all over."  Pt afebrile, VSS, urine clear. Monitor. Tonya [name]
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
## 2011-08-10 NOTE — Progress Notes (Signed)
Physical Therapy Weekly Progress Note  Patient Details  Name: Tonya [name] MRN: [id number] Date of Birth: Apr 30, 1985  Today's Date: 08/10/2011 Time: 5621-3086 Time Calculation (min): 43 min  Patient has met  6 of 8 long term goals.  Long term goals now focus secondary to extended length of stay.    Patient continues to demonstrate the following deficits: decreased strength, increased need for assist/ supervision, decreased functional UE ROM and therefore will continue to benefit from skilled PT intervention to enhance overall performance with activity tolerance, balance and ability to compensate for deficits.  See Patient's Care Plan for progression toward long term goals.  Patient progressing toward long term goals..  Continue plan of care.  Skilled Therapeutic Interventions/Progress Updates:  Pain: 5/10 soreness in upper body from outing yesterday   Contract/relax stretch for Lt. Elbow extension. NuStep with UEs only for strength and conditioning, level 4 for 6 min pt refused a second set. Modified crunches from wedge with punching pillow for core strengthening in addition to increasing functional mobility of bil. UEs.    Second Session Skilled Therapeutic Interventions/Progress Updates:  Time:  5784-6962 Time Calculation (min): 55 min Pain: 4/10 see below Pt in bed at start of treatment, not desiring to work with PT. Pt eventually encouraged enough to get OOB. Performed bil. UE pectoralis and bicep stretches secondary to soreness. Sliding boar transfer bed>bedside commode>wheelchair performed with supervision. Wii boxing and tennis while seated on wobble disc and pillow working to encourage active Lt. Elbow extension ROM and core stability. Pt slow to initiate therapies today as she reports she is "tired."   Therapy Documentation Precautions:  Precautions Precautions: Fall Restrictions Weight Bearing Restrictions: Yes RUE Weight Bearing: Weight bearing as tolerated LUE  Weight Bearing: Weight bearing as tolerated RLE Weight Bearing: Non weight bearing LLE Weight Bearing: Non weight bearing Pain: Pain Assessment Pain Assessment: 0-10 Pain Score:   4 Pain Type: Acute pain Pain Location: Shoulder Pain Orientation: Right;Left Pain Descriptors: Aching Pain Onset:  (sore from yesterday's outing) Pain Intervention(s): Repositioned (stretched)  See FIM for current functional status  Therapy/Group: Individual Therapy  [name] 08/10/2011, 5:23 PM

## 2011-08-10 NOTE — Progress Notes (Signed)
ANTICOAGULATION CONSULT NOTE - Follow Up Consult  Pharmacy Consult for Coumadin Indication: VTE prophylaxis  No Known Allergies  Patient Measurements: Height: 5\' 5"  (165.1 cm) Weight: 228 lb 9.9 oz (103.7 kg) IBW/kg (Calculated) : 57   Vital Signs: Temp: 98.4 F (36.9 C) (07/11 0601) Temp src: Oral (07/11 0601) BP: 109/75 mmHg (07/11 0601) Pulse Rate: 78  (07/11 0601)  Labs:  [name] 08/10/11 0655 08/09/11 0635 08/08/11 0635  HGB -- -- --  HCT -- -- --  PLT -- -- --  APTT -- -- --  LABPROT 28.6* 26.9* 26.1*  INR 2.64* 2.44* 2.35*  HEPARINUNFRC -- -- --  CREATININE -- -- --  CKTOTAL -- -- --  CKMB -- -- --  TROPONINI -- -- --    Estimated Creatinine Clearance: 127.3 ml/min (by C-G formula based on Cr of 0.68).   Medications:  Scheduled:     . antiseptic oral rinse  15 mL Mouth Rinse QID  . bacitracin  1 application Topical BID  . DULoxetine  60 mg Oral Daily  . ferrous sulfate  325 mg Oral TID WC  . gabapentin  300 mg Oral BID  . gabapentin  600 mg Oral QHS  . hydrocerin   Topical BID  . HYDROcodone-acetaminophen  1 tablet Oral QID  . multivitamin with minerals  1 tablet Oral Daily  . OLANZapine  15 mg Oral Daily  . QUEtiapine  100 mg Oral Q2000  . senna-docusate  2 tablet Oral BID  . warfarin  10 mg Oral ONCE-1800  . Warfarin - Pharmacist Dosing Inpatient   Does not apply q1800    Assessment: 26yo female s/p multiple ortho surgeries, on Coumadin for VTE px.  INR is at goal -trending up. No bleeding noted. H/H from 7/7 noted to be low but stable.  Goal of Therapy:  INR 2-3 Monitor platelets by anticoagulation protocol: Yes   Plan:  1. Coumadin 10mg  PO x 1 tonight  2. F/u AM INR  Juliette Alcide, PharmD, BCPS.  Pager: [phone]  08/10/2011 12:26 PM

## 2011-08-10 NOTE — Progress Notes (Signed)
Occupational Therapy Session Note  Patient Details  Name: Tonya [name] MRN: [id number] Date of Birth: Jun 22, 1985  Today's Date: 08/10/2011 Time: 7829-5621 Time Calculation (min): 40 min  Short Term Goals: Week 3:  OT Short Term Goal 1 (Week 3): LTGs Upgraded to overall Mod I - Supervision   Skilled Therapeutic Interventions/Progress Updates:    1:1 OT with focus on slide board transfers, engagement in therapy, BUE strengthening with self-propulsion of w/c and theraband activities, and ball toss activity.  BUE strengthening using green theraband with focus on scapular retraction, bicep curls, shoulder depression, and elbow extension.  Lateral weight shifting in sitting with ball toss activity with BUE use.  Slide board transfers with supervision.   Therapy Documentation Precautions:  Precautions Precautions: Fall Restrictions Weight Bearing Restrictions: Yes RUE Weight Bearing: Weight bearing as tolerated LUE Weight Bearing: Weight bearing as tolerated RLE Weight Bearing: Non weight bearing LLE Weight Bearing: Non weight bearing Pain:   Pt with no c/o pain this session.  See FIM for current functional status  Therapy/Group: Individual Therapy  Leonette Monarch 08/10/2011, 4:11 PM

## 2011-08-11 DIAGNOSIS — S82839A Other fracture of upper and lower end of unspecified fibula, initial encounter for closed fracture: Secondary | ICD-10-CM

## 2011-08-11 DIAGNOSIS — IMO0002 Reserved for concepts with insufficient information to code with codable children: Secondary | ICD-10-CM

## 2011-08-11 DIAGNOSIS — Z5189 Encounter for other specified aftercare: Secondary | ICD-10-CM

## 2011-08-11 DIAGNOSIS — S82109A Unspecified fracture of upper end of unspecified tibia, initial encounter for closed fracture: Secondary | ICD-10-CM

## 2011-08-11 LAB — CBC
HCT: 35.8 % — ABNORMAL LOW (ref 36.0–46.0)
MCV: 88.6 fL (ref 78.0–100.0)
Platelets: 226 10*3/uL (ref 150–400)
RBC: 4.04 MIL/uL (ref 3.87–5.11)
WBC: 7.7 10*3/uL (ref 4.0–10.5)

## 2011-08-11 MED ORDER — WARFARIN SODIUM 10 MG PO TABS
10.0000 mg | ORAL_TABLET | Freq: Once | ORAL | Status: AC
  Administered 2011-08-11: 10 mg via ORAL
  Filled 2011-08-11: qty 1

## 2011-08-11 NOTE — Progress Notes (Signed)
Physical Therapy Session Note  Patient Details  Name: Tonya [name] MRN: [id number] Date of Birth: 1985/04/27  Today's Date: 08/11/2011 Time: 1000-1059 Time Calculation (min): 59 min  Short Term Goals: Week 3:   Unmet long term goals  Skilled Therapeutic Interventions/Progress Updates:    Pt anticipating therapy today, waiting for therapist in hallway. NuStep performed x 10 min with UEs only, level 4. Pt encouraged to keep "steps" to 35 for increased strength, endurance, and cardiorespiratory function. Lt. Elbow contract relax stretch into extension then into flexion at various angles of shoulder abduction to promote increased functional use of Lt. UE (to do pt's hair). Soft tissue massage performed over medial elbow as pt appears to have troublesome scar tissue here. Moderate intensity core strengthening workout including various supine and side lying activities working rectus abdominus and the obliques, 30 sec reps alternating with 1 min rest.    Therapy Documentation Precautions:  Precautions Precautions: Fall Restrictions Weight Bearing Restrictions: Yes RUE Weight Bearing: Weight bearing as tolerated LUE Weight Bearing: Weight bearing as tolerated RLE Weight Bearing: Non weight bearing LLE Weight Bearing: Non weight bearing Pain: Pain Assessment Pain Assessment: 0-10 Pain Score:   5 Pain Type: Acute pain Pain Location: Arm Pain Orientation: Left Pain Descriptors: Aching Pain Frequency: Occasional Pain Onset: With Activity (into elbow extension and flexion) Patients Stated Pain Goal: 2 Pain Intervention(s): Repositioned (stretched) Locomotion : Wheelchair Mobility Distance: >200'   See FIM for current functional status  Therapy/Group: Individual Therapy   [name] 08/11/2011, 12:29 PM

## 2011-08-11 NOTE — Progress Notes (Signed)
Subjective/Complaints:  Slept well. Continued aches and pains .Marland KitchenMarland KitchenA 12 point review of systems has been performed and if not noted above is otherwise negative.    BP Readings from Last 3 Encounters:  08/11/11 110/73  07/19/11 123/64  07/19/11 123/64   Lab Results  Component Value Date   INR 2.31* 08/11/2011   INR 2.64* 08/10/2011   INR 2.44* 08/09/2011     Objective: Vital Signs: Blood pressure 110/73, pulse 87, temperature 97.5 F (36.4 C), temperature source Other (Comment), resp. rate 20, height 5\' 5"  (1.651 m), weight 103.7 kg (228 lb 9.9 oz), last menstrual period 06/23/2011, SpO2 95.00%. No results found.  Basename 08/11/11 0450  WBC 7.7  HGB 11.6*  HCT 35.8*  PLT 226   No results found for this basename: NA:2,K:2,CL:2,CO2:2,GLUCOSE:2,BUN:2,CREATININE:2,CALCIUM:2 in the last 72 hours CBG (last 3)  No results found for this basename: GLUCAP:3 in the last 72 hours  Wt Readings from Last 3 Encounters:  08/09/11 103.7 kg (228 lb 9.9 oz)  07/17/11 106.958 kg (235 lb 12.8 oz)  07/17/11 106.958 kg (235 lb 12.8 oz)    Physical Exam:  General appearance: alert, cooperative, appears stated age and no distress Head: Normocephalic, without obvious abnormality, atraumatic Eyes: conjunctivae/corneas clear. PERRL, EOM's intact. Ears: normal external ear canals both ears Nose: Nares normal. Septum midline. Mucosa normal. No drainage or sinus tenderness. Throat: lips, mucosa, and tongue normal; teeth and gums normal Neck: no adenopathy, no carotid bruit, no JVD, supple, symmetrical, trachea midline and thyroid not enlarged, symmetric, no tenderness/mass/nodules Back: symmetric, no curvature. ROM normal. No CVA tenderness. Resp: clear to auscultation bilaterally Cardio:  tachy regular  rhythm, S1, S2 normal, no murmur, click, rub or gallop GI: soft, non-tender; bowel sounds normal; no masses,  no organomegaly Extremities: extremities normal, atraumatic, no cyanosis or  edema Pulses: 2+ and symmetric Skin: Skin color, texture, turgor normal. No rashes or lesions Neurologic: Grossly normal Incision/Wound: multiple wounds with eschar and granulation. Minimal drainage. . Sutures removed from left AKA. Mild serous drainage. Wound approximated. Abrasion over left buttock Psych: less pressured speech and anxiety. More redirectable. Better awareness overall. M/S: -10-15 degrees full extension at left elbow (resisting extension on exam?). Generalized cervical tenderness  Assessment/Plan: 1. Functional deficits secondary to traumatic right AKA, left tib-fib fx, left 2mc fx, multiple soft-tissue trauma which require 3+ hours per day of interdisciplinary therapy in a comprehensive inpatient rehab setting. Physiatrist is providing close team supervision and 24 hour management of active medical problems listed below. Physiatrist and rehab team continue to assess barriers to discharge/monitor patient progress toward functional and medical goals.   FIM: FIM - Bathing Bathing Steps Patient Completed: Chest;Right Arm;Left Arm;Abdomen;Front perineal area;Buttocks;Right upper leg;Left upper leg;Left lower leg (including foot) Bathing: 6: Assistive device (Comment)  FIM - Upper Body Dressing/Undressing Upper body dressing/undressing steps patient completed: Thread/unthread right bra strap;Thread/unthread left bra strap;Hook/unhook bra;Thread/unthread right sleeve of pullover shirt/dresss;Thread/unthread left sleeve of pullover shirt/dress;Put head through opening of pull over shirt/dress;Pull shirt over trunk Upper body dressing/undressing: 7: Complete Independence: No helper FIM - Lower Body Dressing/Undressing Lower body dressing/undressing steps patient completed: Thread/unthread right underwear leg;Thread/unthread left underwear leg;Pull underwear up/down;Thread/unthread right pants leg;Thread/unthread left pants leg;Pull pants up/down;Don/Doff left sock Lower body  dressing/undressing: 6: Assistive device (Comment)  FIM - Toileting Toileting steps completed by patient: Adjust clothing prior to toileting;Performs perineal hygiene;Adjust clothing after toileting Toileting Assistive Devices: Grab bar or rail for support Toileting: 5: Supervision: Safety issues/verbal cues  FIM - Toilet Transfers  Restaurant manager, fast food Transfers: 5-To toilet/BSC: Supervision (verbal cues/safety issues);5-From toilet/BSC: Supervision (verbal cues/safety issues)  FIM - Banker Devices: Sliding board Bed/Chair Transfer: 6: Supine > Sit: No assist;7: Sit > Supine: No assist;5: Bed > Chair or W/C: Supervision (verbal cues/safety issues);5: Chair or W/C > Bed: Supervision (verbal cues/safety issues)  FIM - Locomotion: Wheelchair Distance: >300 Locomotion: Wheelchair: 6: Travels 150 ft or more, turns around, maneuvers to table, bed or toilet, negotiates 3% grade: maneuvers on rugs and over door sills independently FIM - Locomotion: Ambulation Locomotion: Ambulation: 0: Activity did not occur (not appropriate given injury)  Comprehension Comprehension Mode: Auditory Comprehension: 6-Follows complex conversation/direction: With extra time/assistive device  Expression Expression Mode: Verbal Expression: 6-Expresses complex ideas: With extra time/assistive device  Social Interaction Social Interaction: 6-Interacts appropriately with others with medication or extra time (anti-anxiety, antidepressant).  Problem Solving Problem Solving: 6-Solves complex problems: With extra time  Memory Memory: 6-Assistive device: No helper  1. Polytrauma after suicide attempt 06/27/2011   -team questioning about advanced wb on left  -i will check with ortho, but the reality here is that she will have to be FULL WB on the leg to make any use out of it functionally. 2. DVT Prophylaxis/Anticoagulation: Coumadin.  Venous Doppler study 07/07/2011 negative. Monitor platelet counts any signs of bleeding   3. Pain management. Wean meds.. Monitor with increased mobility. neurontin for phantom limb pain to help with mood stabilization.   -heat for cervicalgia- needs to work on posture also  -ROM for neck and left elbow 4. Right AKA 06/27/2011. Dressing changes as advised- wearing shrinker garment at present  -dry dressing to stump. Slight serous drainage noted. 5. Severely comminuted left tibia-fibula fracture. ORIF/nonweightbearing  6. Left second metacarpal fracture and right forearm fifth digit extensor tendon injury status post repair. Weightbearing as tolerated. She is now out of the splint. May benefit from a small wrist splint for support.  -ordered gloves for wheelchair. 7. Bipolar/mania:  -improved   -seroquel 100mg  qhs, zyprexa 15mg  qam-   - Continue cymbalta at 60mg   -appreciate Dr. Blenda Peals help and Dr. Maxwell Marion input  -continue neurontin 300mg  bid and 600 qhs)--this has helped with pain also    LOS (Days) 23 A FACE TO FACE EVALUATION WAS PERFORMED  SWARTZ,ZACHARY T 08/11/2011, 7:39 AM

## 2011-08-11 NOTE — Progress Notes (Signed)
Occupational Therapy Session Note  Patient Details  Name: Tonya [name] MRN: [id number] Date of Birth: 1985/05/20  Today's Date: 08/11/2011 Time: 1345-1430Time Calculation (min): 45 min  Short Term Goals: Week 3:  OT Short Term Goal 1 (Week 3): LTGs Upgraded to overall Mod I - Supervision   Skilled Therapeutic Interventions/Progress Updates:   Engaged in therapeutic activity with focus on coordination, manipulation using Left and right UE.  Did jacks, tennis, badminton.  Had pt switch back and forth between right and left hand.  In 9 hole peg did 30 sec on right and 35 on left.  Pt. Independent in wc mobility on level surface;; needed min assist on slight incline.    Therapy Documentation Precautions:  Precautions Precautions: Fall Restrictions Weight Bearing Restrictions: Yes RUE Weight Bearing: Weight bearing as tolerated LUE Weight Bearing: Weight bearing as tolerated RLE Weight Bearing: Non weight bearing LLE Weight Bearing: Non weight bearing      Pain:  none          See FIM for current functional status  Therapy/Group: Individual Therapy  Humberto Seals 08/11/2011, 2:30 PM

## 2011-08-11 NOTE — Progress Notes (Signed)
ANTICOAGULATION CONSULT NOTE - Follow Up Consult  Pharmacy Consult for Coumadin Indication: VTE prophylaxis  No Known Allergies  Patient Measurements: Height: 5\' 5"  (165.1 cm) Weight: 228 lb 9.9 oz (103.7 kg) IBW/kg (Calculated) : 57   Vital Signs: Temp: 97.5 F (36.4 C) (07/12 0420) Temp src: Other (Comment) (07/12 0420) BP: 110/73 mmHg (07/12 0420) Pulse Rate: 87  (07/12 0420)  Labs:  Basename 08/11/11 0450 08/10/11 0655 08/09/11 0635  HGB 11.6* -- --  HCT 35.8* -- --  PLT 226 -- --  APTT -- -- --  LABPROT 25.8* 28.6* 26.9*  INR 2.31* 2.64* 2.44*  HEPARINUNFRC -- -- --  CREATININE -- -- --  CKTOTAL -- -- --  CKMB -- -- --  TROPONINI -- -- --    Estimated Creatinine Clearance: 127.3 ml/min (by C-G formula based on Cr of 0.68).   Medications:  Scheduled:     . antiseptic oral rinse  15 mL Mouth Rinse QID  . bacitracin  1 application Topical BID  . DULoxetine  60 mg Oral Daily  . ferrous sulfate  325 mg Oral TID WC  . gabapentin  300 mg Oral BID  . gabapentin  600 mg Oral QHS  . hydrocerin   Topical BID  . HYDROcodone-acetaminophen  1 tablet Oral QID  . multivitamin with minerals  1 tablet Oral Daily  . OLANZapine  15 mg Oral Daily  . QUEtiapine  100 mg Oral Q2000  . senna-docusate  2 tablet Oral BID  . warfarin  10 mg Oral ONCE-1800  . warfarin  10 mg Oral ONCE-1800  . Warfarin - Pharmacist Dosing Inpatient   Does not apply q1800    Assessment: 25yo female s/p multiple ortho surgeries, on Coumadin for VTE px.  INR is at goal. No bleeding noted. Pt is anemic but CBC is stable.  Goal of Therapy:  INR 2-3 Monitor platelets by anticoagulation protocol: Yes   Plan:  1. Coumadin 10mg  PO x 1 tonight  2. F/u AM INR  Lysle Pearl, PharmD, BCPS Pager # [phone] 08/11/2011 8:08 AM

## 2011-08-11 NOTE — Care Management Note (Signed)
Per State Regulation 482.30 This chart was reviewed for medical necessity with respect to the patient's Admission/Duration of stay. Pt participating & progressing txs.  Still awaiting [place] # in order to send out FL2 to seek SNF bed.   Brock Ra                 Nurse Care Manager              Next Review Date: 08/15/11

## 2011-08-11 NOTE — Progress Notes (Signed)
Occupational Therapy Session Note  Patient Details  Name: Tonya [name] MRN: [id number] Date of Birth: May 13, 1985  Today's Date: 08/11/2011 Time: 0830-0910 Time Calculation (min): 40 min  Short Term Goals: Week 1:  OT Short Term Goal 1 (Week 1): Pt will perform basic transfers with max A +2  OT Short Term Goal 1 - Progress (Week 1): Met OT Short Term Goal 2 (Week 1): Pt will perform grooming at the sink mod I OT Short Term Goal 2 - Progress (Week 1): Met OT Short Term Goal 3 (Week 1): Pt will be able to setup meal with mod I OT Short Term Goal 3 - Progress (Week 1): Met OT Short Term Goal 4 (Week 1): Pt will don shirt with min A OT Short Term Goal 4 - Progress (Week 1): Met OT Short Term Goal 5 (Week 1): Pt will tolerate 30 min of funcitonal activity with only 3 rest breaks OT Short Term Goal 5 - Progress (Week 1): Met  Week 2:  OT Short Term Goal 1 (Week 2): STG = Unmet LTGs OT Short Term Goal 1 - Progress (Week 2): Met  Week 3:  OT Short Term Goal 1 (Week 3): LTGs Upgraded to overall Mod I - Supervision   Skilled Therapeutic Interventions/Progress Updates:  Patient found seated on bed upon entering room. Engaged in edge of bed -> w/c slide board transfer with supervision. Patient then needed assistance positioning w/c in bathroom for w/c -> padded tub bench transfer. Supervision for slide board transfer onto padded tub bench. Patient then engaged in UB/LB bathing at modified independent level. After shower patient transferred into w/c with supervision, just needing assistance to place towel under bottom to decrease friction between bottom and slide board. Patient sat in w/c to complete UB/LB dressing and only needed physical assistance to pull underwear all the way up to waist. Notified RN of needed dressing changes and left patient seated in w/c with RN present.    Precautions:  Precautions Precautions: Fall Restrictions Weight Bearing Restrictions: Yes RUE Weight Bearing:  Weight bearing as tolerated LUE Weight Bearing: Weight bearing as tolerated RLE Weight Bearing: Non weight bearing LLE Weight Bearing: Non weight bearing  See FIM for current functional status  Therapy/Group: Individual Therapy  Skai Lickteig 08/11/2011, 9:13 AM

## 2011-08-11 NOTE — Progress Notes (Signed)
Physical Therapy Session Note  Patient Details  Name: Tonya [name] MRN: [id number] Date of Birth: [birth year]  Today's Date: 08/11/2011 Time: 1515-1600 Time Calculation (min): 45 min  Short Term Goals: Week 3:    STG = LTG  Skilled Therapeutic Interventions/Progress Updates:    Pt sleepy this session, mod encouragement to participate. Worked on upper body strength and ROM with resisted curls and press ups with 4# dumbbells, volleyball toss at limits of reach. Performed Lt. Elbow contract relax stretch per pt request. Worked on core strengthening with side to side rolling without railing 2 x 10 reps. Pt reports phantom pain at times so reeducated and practiced visualization of Rt. LE movement in conjunction with Lt. LE foot movement with eyes closed. Also performed Rt. Residual limb desensitization with towel. Pt to attempt LE exercises tonight before bed.   Therapy Documentation Precautions:  Precautions Precautions: Fall Restrictions Weight Bearing Restrictions: Yes RUE Weight Bearing: Weight bearing as tolerated LUE Weight Bearing: Weight bearing as tolerated RLE Weight Bearing: Non weight bearing LLE Weight Bearing: Non weight bearing Pain: Pain Assessment Pain Assessment: No/denies pain Locomotion : Wheelchair Mobility Distance: >200' modified independent   See FIM for current functional status  Therapy/Group: Individual Therapy  [name] 08/11/2011, 4:21 PM

## 2011-08-12 LAB — PROTIME-INR: INR: 2.35 — ABNORMAL HIGH (ref 0.00–1.49)

## 2011-08-12 MED ORDER — WARFARIN SODIUM 10 MG PO TABS
10.0000 mg | ORAL_TABLET | Freq: Once | ORAL | Status: AC
  Administered 2011-08-12: 10 mg via ORAL
  Filled 2011-08-12: qty 1

## 2011-08-12 NOTE — Progress Notes (Signed)
ANTICOAGULATION CONSULT NOTE - Follow Up Consult  Pharmacy Consult for Coumadin Indication: VTE prophylaxis  No Known Allergies  Patient Measurements: Height: 5\' 5"  (165.1 cm) Weight: 228 lb 9.9 oz (103.7 kg) IBW/kg (Calculated) : 57   Vital Signs: Temp: 97.9 F (36.6 C) (07/13 0500) Temp src: Oral (07/13 0500) BP: 134/95 mmHg (07/13 0500) Pulse Rate: 92  (07/13 0500)  Labs:  Basename 08/12/11 0630 08/11/11 0450 08/10/11 0655  HGB -- 11.6* --  HCT -- 35.8* --  PLT -- 226 --  APTT -- -- --  LABPROT 26.1* 25.8* 28.6*  INR 2.35* 2.31* 2.64*  HEPARINUNFRC -- -- --  CREATININE -- -- --  CKTOTAL -- -- --  CKMB -- -- --  TROPONINI -- -- --    Estimated Creatinine Clearance: 127.3 ml/min (by C-G formula based on Cr of 0.68).   Medications:  Scheduled:     . antiseptic oral rinse  15 mL Mouth Rinse QID  . bacitracin  1 application Topical BID  . DULoxetine  60 mg Oral Daily  . ferrous sulfate  325 mg Oral TID WC  . gabapentin  300 mg Oral BID  . gabapentin  600 mg Oral QHS  . hydrocerin   Topical BID  . HYDROcodone-acetaminophen  1 tablet Oral QID  . multivitamin with minerals  1 tablet Oral Daily  . OLANZapine  15 mg Oral Daily  . QUEtiapine  100 mg Oral Q2000  . senna-docusate  2 tablet Oral BID  . warfarin  10 mg Oral ONCE-1800  . warfarin  10 mg Oral ONCE-1800  . Warfarin - Pharmacist Dosing Inpatient   Does not apply q1800    Assessment: 26yo female s/p multiple ortho surgeries, on Coumadin for VTE px.  INR remains at goal. No bleeding noted. Pt is anemic but CBC is stable as of 7/12. No new labs today.   Goal of Therapy:  INR 2-3 Monitor platelets by anticoagulation protocol: Yes   Plan:  1. Coumadin 10mg  PO x 1 tonight  2. F/u AM INR  Lysle Pearl, PharmD, BCPS Pager # [phone] 08/12/2011 7:42 AM

## 2011-08-12 NOTE — Progress Notes (Signed)
Physical Therapy Session Note  Patient Details  Name: Tonya [name] MRN: [id number] Date of Birth: [birth year]  Today's Date: 08/12/2011 Time: 4098-1191 Time Calculation (min): 45 min  Therapy Documentation Precautions:  Fall Weight Bearing Restrictions: Yes RUE Weight Bearing: Weight bearing as tolerated LUE Weight Bearing: Weight bearing as tolerated RLE Weight Bearing: Non weight bearing LLE Weight Bearing: Non weight bearing  Pain: Pain Assessment: 0-10 Pain Score:   6 Pain Type: Acute pain Pain Location: Generalized Pain Descriptors: Aching Pain Frequency: Intermittent Pain Onset: On-going Patients Stated Pain Goal: 2 Pain Intervention(s): Medication (See eMAR)  Therapeutic Exercise:(15') L UE and L LE exercises in supine Therapeutic Activity:(30') w/c-> bed via slide board S/Mod-I,  L elbow joint mobilizations GrII-III for increased Left elbow extension, DTM to same  See FIM for current functional status  Therapy/Group: Individual Therapy  Rex Kras 08/12/2011, 8:42 AM
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
## 2011-08-13 LAB — PROTIME-INR
INR: 2.26 — ABNORMAL HIGH (ref 0.00–1.49)
Prothrombin Time: 25.3 seconds — ABNORMAL HIGH (ref 11.6–15.2)

## 2011-08-13 MED ORDER — WARFARIN SODIUM 10 MG PO TABS
10.0000 mg | ORAL_TABLET | Freq: Every day | ORAL | Status: DC
  Administered 2011-08-13 – 2011-08-15 (×3): 10 mg via ORAL
  Filled 2011-08-13 (×4): qty 1
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
## 2011-08-13 NOTE — Progress Notes (Signed)
ANTICOAGULATION CONSULT NOTE - Follow Up Consult  Pharmacy Consult for Coumadin Indication: VTE prophylaxis  No Known Allergies  Patient Measurements: Height: 5\' 5"  (165.1 cm) Weight: 228 lb 9.9 oz (103.7 kg) IBW/kg (Calculated) : 57   Vital Signs: Temp: 98 F (36.7 C) (07/14 0605) Temp src: Oral (07/14 0605) BP: 124/87 mmHg (07/14 0605) Pulse Rate: 105  (07/14 0605)  Labs:  Basename 08/13/11 0655 08/12/11 0630 08/11/11 0450  HGB -- -- 11.6*  HCT -- -- 35.8*  PLT -- -- 226  APTT -- -- --  LABPROT 25.3* 26.1* 25.8*  INR 2.26* 2.35* 2.31*  HEPARINUNFRC -- -- --  CREATININE -- -- --  CKTOTAL -- -- --  CKMB -- -- --  TROPONINI -- -- --    Estimated Creatinine Clearance: 127.3 ml/min (by C-G formula based on Cr of 0.68).   Medications:  Scheduled:     . antiseptic oral rinse  15 mL Mouth Rinse QID  . bacitracin  1 application Topical BID  . DULoxetine  60 mg Oral Daily  . ferrous sulfate  325 mg Oral TID WC  . gabapentin  300 mg Oral BID  . gabapentin  600 mg Oral QHS  . hydrocerin   Topical BID  . HYDROcodone-acetaminophen  1 tablet Oral QID  . multivitamin with minerals  1 tablet Oral Daily  . OLANZapine  15 mg Oral Daily  . QUEtiapine  100 mg Oral Q2000  . senna-docusate  2 tablet Oral BID  . warfarin  10 mg Oral ONCE-1800  . warfarin  10 mg Oral q1800  . Warfarin - Pharmacist Dosing Inpatient   Does not apply q1800    Assessment: 26yo female s/p multiple ortho surgeries, on Coumadin for VTE px.  INR remains at goal. No bleeding noted. Pt is anemic but CBC is stable as of 7/12. No new labs today.   Goal of Therapy:  INR 2-3 Monitor platelets by anticoagulation protocol: Yes   Plan:  1. Coumadin 10mg  PO daily at 1800  2. F/u AM INR  Lysle Pearl, PharmD, BCPS Pager # [phone] 08/13/2011 7:48 AM

## 2011-08-13 NOTE — Progress Notes (Signed)
Occupational Therapy Session Note  Patient Details  Name: Tonya [name] MRN: [id number] Date of Birth: 1985/07/22  Today's Date: 08/13/2011 Time:  -   1115-1210  (55 min)   Short Term Goals: Week 2:  OT Short Term Goal 1 (Week 2): STG = Unmet LTGs OT Short Term Goal 1 - Progress (Week 2): Met Week 3:  OT Short Term Goal 1 (Week 3): LTGs Upgraded to overall Mod I - Supervision   Skilled Therapeutic Interventions/Progress Updates:    Pt. Engaged in ADL at shower level.  Pt. Transferred to wc with sliding board and then to shower bench at supervision level.   Instructed pt on angling the sliding board to do safer transfer.  Pt. Did lateral leans for bathing and dressing.  Pt. Dropped a number of items while showering.  Obtained reacher for pt for increased independence.with getting items off the floor.  She has made good progress with lateral leans.   Therapy Documentation Precautions:  Precautions Precautions: Fall Restrictions Weight Bearing Restrictions: Yes RUE Weight Bearing: Weight bearing as tolerated LUE Weight Bearing: Weight bearing as tolerated RLE Weight Bearing: Non weight bearing LLE Weight Bearing: Non weight bearing     Pain: Pain Assessment Pain Assessment: No/denies pain      See FIM for current functional status  Therapy/Group: Individual Therapy  Humberto Seals 08/13/2011, 11:59 AM

## 2011-08-14 DIAGNOSIS — F3110 Bipolar disorder, current episode manic without psychotic features, unspecified: Secondary | ICD-10-CM
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
## 2011-08-14 NOTE — Progress Notes (Signed)
Alert and orientated. R AKA with stump shrinker intact. Denuded area to L forehead. Skin abrasions to L breast and, L shin. Areas cleansed with normal saline, allowed to air dried, and allevyn dressing reapplied. L ankle blister/keloid with allevyn dressing reapplied. Bactricin oinment applied to R wrist wound. Pain managed with scheduled Vicodin x 1 tab qid, and Neurotin 300mg . No unsafe behavior. Calls appropriately for assistance. Remains continent of bowel and bladder. Last bowel movement 08/12/11. Receiving scheduled Senna.
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
## 2011-08-14 NOTE — Plan of Care (Signed)
Problem: RH BOWEL ELIMINATION Goal: RH STG MANAGE BOWEL WITH ASSISTANCE STG Manage Bowel with Mod I Assistance.  Outcome: Progressing No incontinent episodes

## 2011-08-14 NOTE — Progress Notes (Signed)
Physical Therapy Session Note  Patient Details  Name: Tonya [name] MRN: [id number] Date of Birth: 09/06/1985  Today's Date: 08/14/2011 Time: 2130-8657 Time Calculation (min): 58 min  Short Term Goals: Week 3:   = Unmet LTGs  First session Skilled Therapeutic Interventions/Progress Updates:  Pain: No c/o pain   NuStep for strength and conditioning x 12 min, level 4 (UEs only). Resisted (green band) prone Rt. LE hip extension, Side lying Rt. Hip extension, abduction. Supine bil. UE press ups with green bands. All exercises performed 3 x 10 reps. Core strengthening with decreasing sized wedges + weighted ball toss 3 x 20 reps. Consolidated amputee exercise packet, encouraged independence for self performance.   Reiterated importance of boosting/performing pressure relief every thirty minutes while seated in wheelchair. Pt demonstrated technique.  Second Session Skilled Therapeutic Interventions/Progress Updates:  Time:  [phone] Time Calculation (min): Pain: 0/10 at start of treatment Sliding board transfer training to difficult surfaces (wheelchair <> low couch). Min verbal cues for positioning of board, clearing bottom over wheel, and checking brake post transfer to see if it had twisted. Pt requesting to go outside, wheelchair mobility to solarium > 1000' modified independent negotiating steep inclines and elevators (Pt now able to get into/out of elevator before door closes). UE exercises in wheelchair while outside: bicep curls and press ups with 5# dumbbell, Wheelchair push ups, external rotation (resisted with green band). Encouraged pressure relief again, pt may benefit from timer to help her remember to pressure relieve. Encouraged pt to perform LE exercise program in bed this afternoon.   Therapy Documentation Precautions:  Precautions Precautions: Fall Restrictions Weight Bearing Restrictions: Yes RUE Weight Bearing: Weight bearing as tolerated LUE Weight  Bearing: Weight bearing as tolerated RLE Weight Bearing: Non weight bearing LLE Weight Bearing: Non weight bearing Locomotion : Wheelchair Mobility Distance: >200'   See FIM for current functional status  Therapy/Group: Individual Therapy both sessions  [name] 08/14/2011, 12:33 PM
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
## 2011-08-14 NOTE — Progress Notes (Signed)
Occupational Therapy Session Note  Patient Details  Name: Tonya [name] MRN: [id number] Date of Birth: [birth year]  Today's Date: 08/14/2011 Time: 0803-0900 and 4098-1191 Time Calculation (min): 57 min and 30 min.  Short Term Goals: Week 3:  OT Short Term Goal 1 (Week 3): LTGs Upgraded to overall Mod I - Supervision   Skilled Therapeutic Interventions/Progress Updates:  1) Patient reports headache and all over ache rated 8/10 and RN provided medication.  Self care retraining to include shower and dressing with focus on safe transfers to include correct/optimal slide board & w/c placement on a consistant basis.  Also focus on energy conservation of patient performing fewer lateral leans that include leaning further to R & L so hips come up to provide clearance to maneuver pants down and up.  2) No reports of pain.  Practiced slide board transfer w/c><elevated commode in her hospital room bathroom.  Patient describes herself as "germaphobic" and asked to be gloved and wiped down the slide board with sanitizer.  While seated on the commode patient stated, "I guess not matter what, my clothes are going to touch the commode seat".   Despite recommendation not to, insisted on lowering her pants before leaving w/c yet leaving pants under her thighs for the slideboard however, slideboard moved considerably w/c>commode (landed on the floor) and the slideboard moved again when patient transferred commode>w/c (required great deal of work for patient to remove slideboard since she was sitting on ~90% of it once in the w/c.  Tomorrow we plan to practice the same transfer yet without the slideboard.  Therapy Documentation Precautions:  Precautions Precautions: Fall Restrictions Weight Bearing Restrictions: Yes RUE Weight Bearing: Weight bearing as tolerated LUE Weight Bearing: Weight bearing as tolerated RLE Weight Bearing: Non weight bearing LLE Weight Bearing: Non weight bearing  See FIM for  current functional status  Therapy/Group: Individual Therapy  Kaianna Dolezal 08/14/2011, 10:24 AM

## 2011-08-15 ENCOUNTER — Inpatient Hospital Stay (HOSPITAL_COMMUNITY): Payer: Medicare Other | Admitting: Occupational Therapy

## 2011-08-15 NOTE — Progress Notes (Signed)
Social Work Patient ID: Tonya [name], female   DOB: 1985/11/11, 26 y.o.   MRN: [id number]  Received [facility] # today!!! FL2 sent out to all area SNFs and will keep staff posted on bed offers.  Pt aware and agreeable still with plan for SNF.  Waldron Gerry

## 2011-08-15 NOTE — Progress Notes (Signed)
Physical Therapy Session Note  Patient Details  Name: Tonya [name] MRN: [id number] Date of Birth: Aug 05, 1985  Today's Date: 08/15/2011 Time: 0900-1000 Time Calculation (min): 60 min  Short Term Goals: Week 3:   = unmet LTG, goals updated due to improved progress  Skilled Therapeutic Interventions/Progress Updates:  Pain: 0/10   Session focused on transfer training to multiple surfaces. Car sliding board transfer performed with supervision, educated pt's fiance on positioning and safety. Car transfer performed again with pt's fiance providing supervision. Sliding board transfer performed to low couch in family room secondary reported difficulty per pt in prior event, pt required moderate verbal cues for UE positioning, assist to hold the board. Recreational therapist and physical therapist took pt to solarium and practiced sliding board transfers to various surfaces/height furniture in community environment. Pt needed verbal cues at times for safe wheelchair positioning and sliding board placement with difficult transfers.   Second Session Skilled Therapeutic Interventions/Progress Updates:  Time:  1530-1616 Time Calculation (min): 41 min Pain: 0/10  Treatment focused on Lt. Elbow extension and bil. UE strengthening. Performed contract relax stretch for Lt. Elbow extension progressing to passive stretch while pt holds weighted ball. While seated pt stretched elbow while holding weighted ball then performed curls and press ups with same ball for strengthening. All sliding board transfers this session supervision/modified independent, appropriate placement of board and wheelchair. Pt slow to engage in activity this session.  Therapy Documentation Precautions:  Precautions Precautions: Fall Restrictions Weight Bearing Restrictions: Yes RUE Weight Bearing: Weight bearing as tolerated LUE Weight Bearing: Weight bearing as tolerated RLE Weight Bearing: Non weight bearing LLE Weight  Bearing: Non weight bearing  See FIM for current functional status  Therapy/Group: Individual Therapy both sessions  [name] 08/15/2011, 12:18 PM

## 2011-08-15 NOTE — Progress Notes (Signed)
Occupational Therapy Session Note  Patient Details  Name: Taraneh Muriah Harsha MRN: [id number] Date of Birth: [birth year]  Today's Date: 08/15/2011 Time: 1000-1100 and 1:45-2:15 Time Calculation (min): 60 min & 30 min  Short Term Goals: Week 3:  OT Short Term Goal 1 (Week 3): LTGs Upgraded to overall Mod I - Supervision   Skilled Therapeutic Interventions/Progress Updates:  1)  No c/o pain. Self care retraining to include toileting on regular commode in patient's hospital room, shower and dress.  Focus session on safe w/c>< commode without slideboard with minimal number of cues and minimal number of lateral leans.  Reviewed need for performing pressure relief while in W/C (every 20-30 minutes) and in bed every (3-4 hours) to avoid pressure points.  Offered again to issue a timer as a reminder yet patient declined stating, "I'll remember".    2)  Patient in bed upon arrival.  Aggressive PROM left elbow flexion and extension with increased ROM after stretching.  Patient reporting pain with stretching at region of medial epicondyle and the ulnar collateral ligements of the humerus.  Stretching provided to the point of pain. Deep tissue massage provided while in sidelying on trigger point in right upper trap at shoulder and neck region.  Patient reports some relief.  Therapy Documentation Precautions:  Precautions Precautions: Fall Restrictions Weight Bearing Restrictions: Yes RUE Weight Bearing: Weight bearing as tolerated LUE Weight Bearing: Weight bearing as tolerated RLE Weight Bearing: Non weight bearing LLE Weight Bearing: Non weight bearing  See FIM for current functional status  Therapy/Group: Individual Therapy  Markeisha Mancias 08/15/2011, 5:54 PM
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
## 2011-08-15 NOTE — Progress Notes (Signed)
Recreational Therapy Session Note  Patient Details  Name: Tonya [name] MRN: [id number] Date of Birth: May 11, 1985 Today's Date: 08/15/2011 Time: 930-10 Pain: no c/o  Skilled Therapeutic Interventions/Progress Updates: Out and about w/c level working on functional mobility, activity tolerance, functional transfers to various surface types and heights.  Pt required questioning cues and Min Assist-needed someone to hold the chair during un-hill transfers with slide board.  Marica Trentham 08/15/2011, 10:51 AM

## 2011-08-16 ENCOUNTER — Inpatient Hospital Stay (HOSPITAL_COMMUNITY): Payer: Medicare Other | Admitting: Occupational Therapy

## 2011-08-16 ENCOUNTER — Inpatient Hospital Stay (HOSPITAL_COMMUNITY): Payer: Medicare Other | Admitting: Physical Therapy

## 2011-08-16 DIAGNOSIS — S82109A Unspecified fracture of upper end of unspecified tibia, initial encounter for closed fracture: Secondary | ICD-10-CM

## 2011-08-16 DIAGNOSIS — S82839A Other fracture of upper and lower end of unspecified fibula, initial encounter for closed fracture: Secondary | ICD-10-CM

## 2011-08-16 DIAGNOSIS — Z5189 Encounter for other specified aftercare: Secondary | ICD-10-CM

## 2011-08-16 DIAGNOSIS — IMO0002 Reserved for concepts with insufficient information to code with codable children: Secondary | ICD-10-CM

## 2011-08-16 LAB — PROTIME-INR
INR: 2.07 — ABNORMAL HIGH (ref 0.00–1.49)
Prothrombin Time: 23.7 seconds — ABNORMAL HIGH (ref 11.6–15.2)

## 2011-08-16 MED ORDER — WARFARIN SODIUM 2.5 MG PO TABS
12.5000 mg | ORAL_TABLET | Freq: Once | ORAL | Status: AC
  Administered 2011-08-16: 12.5 mg via ORAL
  Filled 2011-08-16: qty 1
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
## 2011-08-16 NOTE — Progress Notes (Signed)
Occupational Therapy Session Note  Patient Details  Name: Tonya [name] MRN: [id number] Date of Birth: [birth year]  Today's Date: 08/16/2011 Time: 0800-0900 and 1:45-2:45 Time Calculation (min): 60 min and 60 min  Short Term Goals: Week 3:  OT Short Term Goal 1 (Week 3): LTGs Upgraded to overall Mod I - Supervision   Skilled Therapeutic Interventions/Progress Updates:  1) Self care retraining to include toileting, BSC and shower transfer via slide board, bath and dressing.  Focus session on consistently demonstrating safe transfers and minimal number of minimal leans with LB dressing.  2) Cooking task in therapy kitchen (banana nut pancakes) to include discuss and return demonstrate adaptive techniques and energy conservation while completing kitchen tasks from a w/c.  Issued a reacher to patient which she used independently during the kitchen tasks.  Therapy Documentation Precautions:  Precautions Precautions: Fall Restrictions Weight Bearing Restrictions: Yes RUE Weight Bearing: Weight bearing as tolerated LUE Weight Bearing: Weight bearing as tolerated RLE Weight Bearing: Non weight bearing LLE Weight Bearing: Non weight bearing  See FIM for current functional status  Therapy/Group: Individual Therapy  Cap Massi 08/16/2011, 2:56 PM

## 2011-08-16 NOTE — Progress Notes (Signed)
Physical Therapy Session Note  Patient Details  Name: Tonya [name] MRN: [id number] Date of Birth: 1985/08/02  Today's Date: 08/16/2011 Time: 1500-1600 Time Calculation (min): 60 min  Short Term Goals: Week 3:   = unmet LTGs  Skilled Therapeutic Interventions/Progress Updates:    Bedside commode sliding board transfer, pt required some verbal cues for safe positioning of wheelchair as she performed transfer from 90 degrees causing board to slide. No assist needed. All other transfers modified independent including retrieval of board. Lt. Elbow contract relax stretch, resisted ball exercise for functional use of gained motion. LE HEP performed, pt modified independent with program she is now responsible for performance daily. Reiterated importance of pressure relief.  Therapy Documentation Precautions:  Precautions Precautions: Fall Restrictions Weight Bearing Restrictions: Yes RUE Weight Bearing: Weight bearing as tolerated LUE Weight Bearing: Weight bearing as tolerated RLE Weight Bearing: Non weight bearing LLE Weight Bearing: Non weight bearing Pain: Pain Assessment Pain Assessment: No/denies pain Locomotion : Wheelchair Mobility Distance: >200'   See FIM for current functional status  Therapy/Group: Individual Therapy  [name] 08/16/2011, 5:33 PM

## 2011-08-16 NOTE — Progress Notes (Signed)
Subjective/Complaints:  No new problems. Slept well last night. Nursing also reported a good night .Marland KitchenMarland KitchenA 12 point review of systems has been performed and if not noted above is otherwise negative.    BP Readings from Last 3 Encounters:  08/16/11 101/71  07/19/11 123/64  07/19/11 123/64   Lab Results  Component Value Date   INR 2.07* 08/16/2011   INR 2.49* 08/14/2011   INR 2.26* 08/13/2011     Objective: Vital Signs: Blood pressure 101/71, pulse 79, temperature 98.2 F (36.8 C), temperature source Oral, resp. rate 19, height 5\' 5"  (1.651 m), weight 103.7 kg (228 lb 9.9 oz), last menstrual period 06/23/2011, SpO2 95.00%. No results found. No results found for this basename: WBC:2,HGB:2,HCT:2,PLT:2 in the last 72 hours No results found for this basename: NA:2,K:2,CL:2,CO2:2,GLUCOSE:2,BUN:2,CREATININE:2,CALCIUM:2 in the last 72 hours CBG (last 3)  No results found for this basename: GLUCAP:3 in the last 72 hours  Wt Readings from Last 3 Encounters:  08/09/11 103.7 kg (228 lb 9.9 oz)  07/17/11 106.958 kg (235 lb 12.8 oz)  07/17/11 106.958 kg (235 lb 12.8 oz)    Physical Exam:  General appearance: alert, cooperative, appears stated age and no distress Head: Normocephalic, without obvious abnormality, atraumatic Eyes: conjunctivae/corneas clear. PERRL, EOM's intact. Ears: normal external ear canals both ears Nose: Nares normal. Septum midline. Mucosa normal. No drainage or sinus tenderness. Throat: lips, mucosa, and tongue normal; teeth and gums normal Neck: no adenopathy, no carotid bruit, no JVD, supple, symmetrical, trachea midline and thyroid not enlarged, symmetric, no tenderness/mass/nodules Back: symmetric, no curvature. ROM normal. No CVA tenderness. Resp: clear to auscultation bilaterally Cardio:  tachy regular  rhythm, S1, S2 normal, no murmur, click, rub or gallop GI: soft, non-tender; bowel sounds normal; no masses,  no organomegaly Extremities: extremities normal,  atraumatic, no cyanosis or edema Pulses: 2+ and symmetric Skin: Skin color, texture, turgor normal. No rashes or lesions Neurologic: Grossly normal Incision/Wound: multiple wounds with eschar and granulation. Minimal drainage. . Sutures removed from left AKA. Mild serous drainage. Wound approximated. Abrasion over left buttock Psych: less pressured speech and anxiety. More redirectable. Better awareness overall. M/S: -10-15 degrees full extension at left elbow (resisting extension on exam?). Generalized cervical tenderness  Assessment/Plan: 1. Functional deficits secondary to traumatic right AKA, left tib-fib fx, left 2mc fx, multiple soft-tissue trauma which require 3+ hours per day of interdisciplinary therapy in a comprehensive inpatient rehab setting. Physiatrist is providing close team supervision and 24 hour management of active medical problems listed below. Physiatrist and rehab team continue to assess barriers to discharge/monitor patient progress toward functional and medical goals.   FIM: FIM - Bathing Bathing Steps Patient Completed: Chest;Right Arm;Left Arm;Left lower leg (including foot);Abdomen;Front perineal area;Buttocks;Right upper leg;Left upper leg;Right lower leg (including foot) Bathing: 6: More than reasonable amount of time  FIM - Upper Body Dressing/Undressing Upper body dressing/undressing steps patient completed: Thread/unthread right bra strap;Thread/unthread left bra strap;Hook/unhook bra;Thread/unthread left sleeve of pullover shirt/dress;Put head through opening of pull over shirt/dress;Thread/unthread right sleeve of pullover shirt/dresss;Pull shirt over trunk Upper body dressing/undressing: 7: Complete Independence: No helper FIM - Lower Body Dressing/Undressing Lower body dressing/undressing steps patient completed: Thread/unthread right underwear leg;Thread/unthread left underwear leg;Pull underwear up/down Lower body dressing/undressing: 6: More than  reasonable amount of time  FIM - Toileting Toileting steps completed by patient: Adjust clothing prior to toileting;Performs perineal hygiene;Adjust clothing after toileting Toileting Assistive Devices: Grab bar or rail for support Toileting: 6: More than reasonable amount of time  FIM -  Diplomatic Services operational officer Devices: Elevated toilet seat;Grab bars Toilet Transfers: 5-Set-up assist to: Apply orthosis/W/C setup  FIM - Architectural technologist Transfer: 6: Supine > Sit: No assist;6: Sit > Supine: No assist;5: Bed > Chair or W/C: Supervision (verbal cues/safety issues);5: Chair or W/C > Bed: Supervision (verbal cues/safety issues)  FIM - Locomotion: Wheelchair Distance: >500' Locomotion: Wheelchair: 6: Travels 150 ft or more, turns around, maneuvers to table, bed or toilet, negotiates 3% grade: maneuvers on rugs and over door sills independently FIM - Locomotion: Ambulation Locomotion: Ambulation: 0: Activity did not occur (NA)  Comprehension Comprehension Mode: Auditory Comprehension: 6-Follows complex conversation/direction: With extra time/assistive device  Expression Expression Mode: Verbal Expression Assistive Devices: 6-Communication board Expression: 6-Expresses complex ideas: With extra time/assistive device  Social Interaction Social Interaction: 4-Interacts appropriately 75 - 89% of the time - Needs redirection for appropriate language or to initiate interaction.  Problem Solving Problem Solving: 6-Solves complex problems: With extra time  Memory Memory: 7-Complete Independence: No helper  1. Polytrauma after suicide attempt 06/27/2011   -Will contact Ortho Evra guard to weightbearing status of left lower extremity  -checking with ortho, but the reality here is that she will have to be FULL WB on the leg to make any use out of it functionally. 2. DVT Prophylaxis/Anticoagulation: Coumadin.  Venous Doppler study 07/07/2011 negative. No reported bleeding episodes. Pharmacy continue per protocol   3. Pain management. Wean meds.. Monitor with increased mobility. neurontin for phantom limb pain to help with mood stabilization. Pain control much improved  -heat for cervicalgia- needs to work on posture also  -ROM for neck and left elbow 4. Right AKA 06/27/2011. Dressing changes as advised- wearing shrinker garment at present  -dry dressing to stump. Slight serous drainage noted. 5. Severely comminuted left tibia-fibula fracture. ORIF/nonweightbearing . Will followup with Dr. Carola Frost 6. Left second metacarpal fracture and right forearm fifth digit extensor tendon injury status post repair. Weightbearing as tolerated. She is now out of the splint. May benefit from a small wrist splint for support.  -ordered gloves for wheelchair. 7. Bipolar/mania:  -improved /mood and spirits much improved. She is participating much better with therapies  -seroquel 100mg  qhs, zyprexa 15mg  qam, cymbalta 60 qday. This will be her outpt regimen  -also continue neurontin 300mg  bid and 600 qhs)--this has helped with pain also    LOS (Days) 28 A FACE TO FACE EVALUATION WAS PERFORMED  ANGIULLI,DANIEL J. 08/16/2011, 7:40 AM

## 2011-08-17 ENCOUNTER — Inpatient Hospital Stay (HOSPITAL_COMMUNITY): Payer: Medicare Other | Admitting: Occupational Therapy

## 2011-08-17 ENCOUNTER — Inpatient Hospital Stay (HOSPITAL_COMMUNITY): Payer: Medicare Other | Admitting: Physical Therapy

## 2011-08-17 ENCOUNTER — Encounter (HOSPITAL_COMMUNITY): Payer: Medicare Other | Admitting: Occupational Therapy

## 2011-08-17 MED ORDER — WARFARIN SODIUM 10 MG PO TABS
10.0000 mg | ORAL_TABLET | Freq: Once | ORAL | Status: AC
  Administered 2011-08-17: 10 mg via ORAL
  Filled 2011-08-17: qty 1

## 2011-08-17 NOTE — Discharge Summary (Signed)
  Discharge summary job [id number]
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
## 2011-08-17 NOTE — Progress Notes (Signed)
Patient ID: Tonya [name], female   DOB: [birth year], 26 y.o.   MRN: [id number] Subjective/Complaints:  Refused am inr draw .Marland KitchenMarland KitchenA 12 point review of systems has been performed and if not noted above is otherwise negative.    BP Readings from Last 3 Encounters:  08/17/11 113/80  07/19/11 123/64  07/19/11 123/64   Lab Results  Component Value Date   INR 2.07* 08/16/2011   INR 2.49* 08/14/2011   INR 2.26* 08/13/2011     Objective: Vital Signs: Blood pressure 113/80, pulse 74, temperature 97.9 F (36.6 C), temperature source Oral, resp. rate 17, height 5\' 5"  (1.651 m), weight 103.6 kg (228 lb 6.3 oz), last menstrual period 06/23/2011, SpO2 95.00%. No results found. No results found for this basename: WBC:2,HGB:2,HCT:2,PLT:2 in the last 72 hours No results found for this basename: NA:2,K:2,CL:2,CO2:2,GLUCOSE:2,BUN:2,CREATININE:2,CALCIUM:2 in the last 72 hours CBG (last 3)  No results found for this basename: GLUCAP:3 in the last 72 hours  Wt Readings from Last 3 Encounters:  08/16/11 103.6 kg (228 lb 6.3 oz)  07/17/11 106.958 kg (235 lb 12.8 oz)  07/17/11 106.958 kg (235 lb 12.8 oz)    Physical Exam:  General appearance: alert, cooperative, appears stated age and no distress Head: Normocephalic, without obvious abnormality, atraumatic Eyes: conjunctivae/corneas clear. PERRL, EOM's intact. Ears: normal external ear canals both ears Nose: Nares normal. Septum midline. Mucosa normal. No drainage or sinus tenderness. Throat: lips, mucosa, and tongue normal; teeth and gums normal Neck: no adenopathy, no carotid bruit, no JVD, supple, symmetrical, trachea midline and thyroid not enlarged, symmetric, no tenderness/mass/nodules Back: symmetric, no curvature. ROM normal. No CVA tenderness. Resp: clear to auscultation bilaterally Cardio:  tachy regular  rhythm, S1, S2 normal, no murmur, click, rub or gallop GI: soft, non-tender; bowel sounds normal; no masses,  no  organomegaly Extremities: extremities normal, atraumatic, no cyanosis or edema Pulses: 2+ and symmetric Skin: Skin color, texture, turgor normal. No rashes or lesions Neurologic: Grossly normal Incision/Wound: multiple wounds with eschar and granulation. Minimal drainage. . Sutures removed from left AKA. Mild serous drainage. Wound approximated. Abrasion over left buttock Psych: less pressured speech and anxiety. More redirectable. Better awareness overall. M/S: -10-15 degrees full extension at left elbow (resisting extension on exam?). Generalized cervical tenderness  Assessment/Plan: 1. Functional deficits secondary to traumatic right AKA, left tib-fib fx, left 2mc fx, multiple soft-tissue trauma which require 3+ hours per day of interdisciplinary therapy in a comprehensive inpatient rehab setting. Physiatrist is providing close team supervision and 24 hour management of active medical problems listed below. Physiatrist and rehab team continue to assess barriers to discharge/monitor patient progress toward functional and medical goals.   FIM: FIM - Bathing Bathing Steps Patient Completed: Chest;Right Arm;Left Arm;Left lower leg (including foot);Abdomen;Front perineal area;Buttocks;Right upper leg;Left upper leg;Right lower leg (including foot) Bathing: 6: More than reasonable amount of time  FIM - Upper Body Dressing/Undressing Upper body dressing/undressing steps patient completed: Thread/unthread right bra strap;Thread/unthread left bra strap;Hook/unhook bra;Thread/unthread left sleeve of pullover shirt/dress;Put head through opening of pull over shirt/dress;Thread/unthread right sleeve of pullover shirt/dresss;Pull shirt over trunk Upper body dressing/undressing: 7: Complete Independence: No helper FIM - Lower Body Dressing/Undressing Lower body dressing/undressing steps patient completed: Thread/unthread right underwear leg;Thread/unthread left underwear leg;Pull underwear  up/down Lower body dressing/undressing: 6: More than reasonable amount of time  FIM - Toileting Toileting steps completed by patient: Adjust clothing prior to toileting;Performs perineal hygiene;Adjust clothing after toileting Toileting Assistive Devices: Grab bar or rail for support Toileting: 6: Assistive device:  No helper  FIM - Diplomatic Services operational officer Devices: Bedside commode;Sliding board Toilet Transfers: 5-To toilet/BSC: Supervision (verbal cues/safety issues)  FIM - Banker Devices: Sliding board Bed/Chair Transfer: 6: Supine > Sit: No assist;6: Sit > Supine: No assist;6: Bed > Chair or W/C: No assist;6: Chair or W/C > Bed: No assist  FIM - Locomotion: Wheelchair Distance: >200' Locomotion: Wheelchair: 6: Travels 150 ft or more, turns around, maneuvers to table, bed or toilet, negotiates 3% grade: maneuvers on rugs and over door sills independently FIM - Locomotion: Ambulation Locomotion: Ambulation: 0: Activity did not occur (NWB bil. LES)  Comprehension Comprehension Mode: Auditory Comprehension: 6-Follows complex conversation/direction: With extra time/assistive device  Expression Expression Mode: Verbal Expression Assistive Devices: 6-Communication board Expression: 6-Expresses complex ideas: With extra time/assistive device  Social Interaction Social Interaction: 6-Interacts appropriately with others with medication or extra time (anti-anxiety, antidepressant).  Problem Solving Problem Solving: 6-Solves complex problems: With extra time  Memory Memory: 7-Complete Independence: No helper  1. Polytrauma after suicide attempt 06/27/2011   -Will contact Ortho Ev[facility] guard to weightbearing status of left lower extremity  -nwb for about 2 more weeks per ortho 2. DVT Prophylaxis/Anticoagulation: Coumadin. Venous Doppler study 07/07/2011 negative. No reported bleeding episodes. Pharmacy continue per  protocol   3. Pain management. Wean meds.. Monitor with increased mobility. neurontin for phantom limb pain to help with mood stabilization. Pain control much improved  -heat for cervicalgia- needs to work on posture also  -ROM for neck and left elbow 4. Right AKA 06/27/2011. Dressing changes as advised- wearing shrinker garment at present  -aka wound has healed nicely 5. Severely comminuted left tibia-fibula fracture. ORIF/nonweightbearing . Will followup with Dr. Carola Frost 6. Left second metacarpal fracture and right forearm fifth digit extensor tendon injury status post repair. Weightbearing as tolerated. She is now out of the splint. May benefit from a small wrist splint for support.  -ordered gloves for wheelchair. 7. Bipolar/mania:  -improved /mood and spirits much improved. She is participating much better with therapies  -seroquel 100mg  qhs, zyprexa 15mg  qam, cymbalta 60 qday. This will be her outpt regimen  -also continue neurontin 300mg  bid and 600 qhs)--this has helped with pain also    LOS (Days) 29 A FACE TO FACE EVALUATION WAS PERFORMED  Dustyn Dansereau T 08/17/2011, 8:57 AM

## 2011-08-17 NOTE — Progress Notes (Signed)
Occupational Therapy Discharge Summary  Patient Details  Name: Tonya [name] MRN: [id number] Date of Birth: 05-25-1985  Today's Date: 08/17/2011  Time: 1005-1100 and 1500-1540 Time Calculation (min): 55 min and 40 min  Patient has met 11 of 11 long term goals due to improved activity tolerance, improved balance, ability to compensate for deficits, functional use of  RIGHT upper and LEFT upper extremity, improved attention and improved awareness.  Patient to discharge at overall Modified Independent - supervision level.  Patient is independent guiding any caregiver if assistance is necessary.  Reasons goals not met: n/a secondary to all goals met.  Recommendation:  Patient will benefit from ongoing skilled OT services in skilled nursing facility setting to continue to advance functional skills in the area of BADL, iADL, Reduce care partner burden and advance once she is given permission from MD to bear weight into her LLE.  Equipment: No equipment provided at this time secondary to discharge to SNF  Reasons for discharge: treatment goals met and discharge from hospital  Patient/family agrees with progress made and goals achieved: Yes  OT Discharge Precautions/Restrictions  Precautions Precautions: Fall Restrictions RUE Weight Bearing: Weight bearing as tolerated LUE Weight Bearing: Weight bearing as tolerated RLE Weight Bearing: Non weight bearing LLE Weight Bearing: Non weight bearing Pain No reports of pain, premedicated Vision/Perception  Baseline Vision: Wears glasses all the time Patient Visual Report: No change from baseline Perception: Within Functional Limits Praxis: Intact  Cognition Overall Cognitive Status: Appears within functional limits for tasks assessed Orientation Level: Oriented X4 Sensation Light Touch: Not tested (BUEs) Motor  Motor - Discharge Observations: WFL for BADL and IADL Mobility  Mod I for slide board transfers Trunk/Postural  Assessment  WFL Balance Mod I with dynamic sitting balance  Extremity/Trunk Assessment RUE Assessment: Within Functional Limits LUE Assessment: Exceptions to WFL LUE AROM (degrees) LUE Overall AROM Comments: Patient with limited elbow A/PROM in the end ranges.  Has made some improvements with extention yet is limited more significantly now with flexion.  Skilled Therapeutic Interventions:  1)  Self care retraining to include return demonstrating independence with commode transfers therefore sign posted on patient's door indicating that she is Mod I for all transfers in her room unless she needs to call for set up assistance.  Patient continues to be supervision/set up for shower bench transfers secondary to need to place towel under her bottom for slide board transfers back to w/c after shower.   2) deep tissue massage to upper traps to include shoulder and neck areas, and stretching.  Answered appropriate questions regarding news of discharge tomorrow.  See FIM for current functional status  Dequandre Cordova 08/17/2011, 4:40 PM

## 2011-08-17 NOTE — Progress Notes (Addendum)
Physical Therapy Discharge Summary  Patient Details  Name: Tonya [name] MRN: [id number] Date of Birth: 1985-08-05  Today's Date: 08/17/2011 Today's Date: 08/17/2011  Time: 0830-0930  Time Calculation (min): 60 min   Skilled Therapeutic Interventions/Progress Updates:  Transfer to low couch in family room, modified independent. Car transfer performed modified independent. Home environment wheelchair mobility modified independent. Contract relax elbow stretch with improved extension ROM. Pt reports she is performing LE exercises on own.   Patient has met 6 of 6 long term goals due to improved activity tolerance, improved balance, improved postural control, increased strength, increased range of motion, decreased pain, ability to compensate for deficits, functional use of  right upper extremity and left upper extremity, improved attention, improved awareness and improved coordination.  Patient to discharge at a wheelchair level Modified Independent.   Patient's care partner is independent to provide the necessary physical assistance at discharge.  Reasons goals not met: NA  Recommendation:  Patient will benefit from ongoing skilled PT services in home health setting VS. SNF (due to D/C planning) to continue to advance safe functional mobility, address ongoing impairments in pre-prosthetic strengthening and functional independence, and minimize fall risk.  Equipment: No equipment provided  Reasons for discharge: treatment goals met and discharge from hospital  Patient/family agrees with progress made and goals achieved: Yes  PT Discharge Precautions/Restrictions Precautions Precautions: Fall Restrictions RUE Weight Bearing: Weight bearing as tolerated LUE Weight Bearing: Weight bearing as tolerated RLE Weight Bearing: Non weight bearing LLE Weight Bearing: Non weight bearing Pain No C/O pain Vision/Perception  Vision - History Baseline Vision: Wears glasses all the  time Patient Visual Report: No change from baseline Vision - Assessment Eye Alignment: Within Functional Limits Perception Perception: Within Functional Limits Praxis Praxis: Intact  Cognition Overall Cognitive Status: Appears within functional limits for tasks assessed Orientation Level: Oriented X4 Sensation Sensation Light Touch: Impaired Detail Light Touch Impaired Details: Impaired RLE Additional Comments: Phantom pain of Rt. LE at times, phantom sensation also present. Motor  Motor Motor - Discharge Observations: WFL for BADL and IADL  Mobility Bed Mobility Bed Mobility: Rolling Right;Rolling Left;Supine to Sit;Sit to Supine Rolling Right: 6: Modified independent (Device/Increase time) Rolling Left: 6: Modified independent (Device/Increase time) Right Sidelying to Sit: 6: Modified independent (Device/Increase time) Supine to Sit: 6: Modified independent (Device/Increase time) Sitting - Scoot to Edge of Bed: 6: Modified independent (Device/Increase time) Sit to Supine: 6: Modified independent (Device/Increase time) Scooting to [facility]: 6: Modified independent (Device/Increase time) Transfers Lateral/Scoot Transfers: 6: Modified independent (Device/Increase time);With slide board Locomotion  Ambulation Ambulation: No (NWB bil. LEs) Gait Gait: No (NWB bil. LEs) Stairs / Additional Locomotion Stairs: No (NWB bil. LEs) Naval architect Mobility: Yes Wheelchair Assistance: 6: Modified independent (Device/Increase time) Distance: >200'      Balance Balance Balance Assessed: Yes Static Sitting Balance Static Sitting - Balance Support: No upper extremity supported Static Sitting - Level of Assistance: 7: Independent Extremity Assessment  RUE Assessment RUE Assessment: Within Functional Limits LUE Assessment LUE Assessment: Exceptions to WFL LUE AROM (degrees) LUE Overall AROM Comments: Patient with limited elbow A/PROM in the end ranges.  Has made some  improvements with extention yet is limited more significantly now with flexion. RLE Assessment RLE Assessment: Exceptions to [facility] RLE Strength RLE Overall Strength Comments: Generalized weakness, grossly >/= 4-/5 hip flexion/extension, abduction/adduction LLE Assessment LLE Assessment: Exceptions to [facility] (knee flex 2/5, PROM limited by pain, ankle limited by brace) LLE AROM (degrees) LLE Overall AROM Comments: [facility]  LLE Strength LLE Overall Strength Comments: Not accurately assesed due to fracture precautions. Generalized weakness noted.  See FIM for current functional status  [name] 08/17/2011, 5:55 PM

## 2011-08-17 NOTE — Progress Notes (Signed)
Social Work Patient ID: Tonya [name], female   DOB: Margaurite 01, 1987, 26 y.o.   MRN: [id number]  Received SNF offer today from [facility].  Reviewed with patient and her fiance and both agreeable with transfer tomorrow.  Pt wishes to travel to facility via car - fiance has already been trained on car transfers.  Pt considered stable from psychiatric standpoint.  Did discuss with both about her plan for mental health follow-up after SNF discharge.  Reviewed local MH resources with patient planning to connect with [facility]  Will provide written instruction for this process to both pt and facility SW.    Treatment team aware of d/c tomorrow.  Tonya [name]

## 2011-08-17 NOTE — Progress Notes (Signed)
ANTICOAGULATION CONSULT NOTE - Follow Up Consult  Pharmacy Consult for Coumadin Indication: VTE prophylaxis  No Known Allergies  Patient Measurements: Height: 5\' 5"  (165.1 cm) Weight: 228 lb 6.3 oz (103.6 kg) IBW/kg (Calculated) : 57   Vital Signs: Temp: 97.9 F (36.6 C) (07/18 0500) Temp src: Oral (07/18 0500) BP: 113/80 mmHg (07/18 0500) Pulse Rate: 74  (07/18 0500)  Labs:  Basename 08/16/11 0640  HGB --  HCT --  PLT --  APTT --  LABPROT 23.7*  INR 2.07*  HEPARINUNFRC --  CREATININE --  CKTOTAL --  CKMB --  TROPONINI --    Estimated Creatinine Clearance: 127.2 ml/min (by C-G formula based on Cr of 0.68).  Assessment:  26 yo continues on Coumadin for VTE px d/t multi-trauma (right AKA, left open tib-fib fx, left arm tendon rupture) from attempted suicide.  INR today cancelled d/t patient refusal (she was ok aith MWF INR checks), noted was previously stable on Coumadin 10mg , and a one-time dose of 12.5mg  was given 7/17 d/t decr, but still therapeutic INR. No bleeding noted. CBC stable as of 7/12.  Goal of Therapy:  INR 2-3   Plan:  1. Coumadin 10 mg PO x 1 tonight 2. Will attempt to talk to patient to allow INR check in AM d/t ongoing Coumadin 3. Change INR checks back to MWF only for patient comfort  Yasmin Dibello K. Allena Katz, PharmD, BCPS.  Clinical Pharmacist Pager [phone]. 08/17/2011 8:57 AM

## 2011-08-18 DIAGNOSIS — IMO0002 Reserved for concepts with insufficient information to code with codable children: Secondary | ICD-10-CM

## 2011-08-18 DIAGNOSIS — S82839A Other fracture of upper and lower end of unspecified fibula, initial encounter for closed fracture: Secondary | ICD-10-CM

## 2011-08-18 DIAGNOSIS — Z5189 Encounter for other specified aftercare: Secondary | ICD-10-CM

## 2011-08-18 DIAGNOSIS — S82109A Unspecified fracture of upper end of unspecified tibia, initial encounter for closed fracture: Secondary | ICD-10-CM

## 2011-08-18 LAB — PROTIME-INR
INR: 2.41 — ABNORMAL HIGH (ref 0.00–1.49)
Prothrombin Time: 26.6 seconds — ABNORMAL HIGH (ref 11.6–15.2)

## 2011-08-18 NOTE — Progress Notes (Signed)
Social Work  Discharge Note  The overall goal for the admission was met for:   Discharge location: No - plan changed to SNF  Length of Stay: No - lengthier stay than estimated due to bed search process  Discharge activity level: Yes - modified independent w/c level overall  Home/community participation: Yes  Services provided included: MD, RD, PT, OT, RN, CM, TR, Pharmacy, Neuropsych and SW  Financial Services: Medicare and Medicaid  Follow-up services arranged: Other: Pt d/c'd to SNF - [facility] in [place]  Comments (or additional information): Reviewed and provided written instruction on accessing Mental Health Services via [facility] upon her d/c from SNF.  Pt and fiance state understanding of high importance that they connect with this resource immediately upon d/c to community.  Both very agreeable and both have my contact information should they have any further questions.  I will also f/u with SW at [facility] to review this plan as well.  Fiance reports he is very close to securing housing for himself and Betty and hopes to have all of that complete within the next few weeks.  Both understand that her placement in SNF is time limited (~30 days) per [facility] review.    Patient/Family verbalized understanding of follow-up arrangements: Yes  Individual responsible for coordination of the follow-up plan: patient and her fiance  Confirmed correct DME delivered: NA
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
## 2011-09-25 ENCOUNTER — Emergency Department (HOSPITAL_COMMUNITY)
Admission: EM | Admit: 2011-09-25 | Discharge: 2011-09-26 | Disposition: A | Discharge status: Home / Self Care | Payer: Medicare Other | Attending: Emergency Medicine | Admitting: Emergency Medicine

## 2011-09-25 ENCOUNTER — Encounter (HOSPITAL_COMMUNITY): Payer: Self-pay | Admitting: *Deleted

## 2011-09-25 DIAGNOSIS — Z79899 Other long term (current) drug therapy: Secondary | ICD-10-CM | POA: Insufficient documentation

## 2011-09-25 DIAGNOSIS — F319 Bipolar disorder, unspecified: Secondary | ICD-10-CM | POA: Insufficient documentation

## 2011-09-25 DIAGNOSIS — S78119A Complete traumatic amputation at level between unspecified hip and knee, initial encounter: Secondary | ICD-10-CM | POA: Insufficient documentation

## 2011-09-25 DIAGNOSIS — R45851 Suicidal ideations: Secondary | ICD-10-CM | POA: Insufficient documentation

## 2011-09-25 DIAGNOSIS — IMO0002 Reserved for concepts with insufficient information to code with codable children: Secondary | ICD-10-CM | POA: Insufficient documentation

## 2011-09-25 DIAGNOSIS — N390 Urinary tract infection, site not specified: Secondary | ICD-10-CM | POA: Insufficient documentation

## 2011-09-25 HISTORY — DX: Bipolar disorder, unspecified: F31.9

## 2011-09-25 LAB — COMPREHENSIVE METABOLIC PANEL
ALT: 19 U/L (ref 0–35)
AST: 18 U/L (ref 0–37)
Albumin: 3.6 g/dL (ref 3.5–5.2)
Alkaline Phosphatase: 114 U/L (ref 39–117)
Calcium: 9.1 mg/dL (ref 8.4–10.5)
Potassium: 3.5 mEq/L (ref 3.5–5.1)
Sodium: 140 mEq/L (ref 135–145)
Total Protein: 7.5 g/dL (ref 6.0–8.3)

## 2011-09-25 LAB — RAPID URINE DRUG SCREEN, HOSP PERFORMED
Amphetamines: NOT DETECTED
Barbiturates: NOT DETECTED
Benzodiazepines: NOT DETECTED
Cocaine: NOT DETECTED

## 2011-09-25 LAB — CBC
Hemoglobin: 12.4 g/dL (ref 12.0–15.0)
MCH: 28.2 pg (ref 26.0–34.0)
MCHC: 33.5 g/dL (ref 30.0–36.0)
Platelets: 229 10*3/uL (ref 150–400)
RDW: 14.7 % (ref 11.5–15.5)

## 2011-09-25 LAB — PREGNANCY, URINE: Preg Test, Ur: NEGATIVE

## 2011-09-25 MED ORDER — LORAZEPAM 0.5 MG PO TABS
0.5000 mg | ORAL_TABLET | Freq: Four times a day (QID) | ORAL | Status: DC | PRN
  Administered 2011-09-25 – 2011-09-26 (×5): 0.5 mg via ORAL
  Filled 2011-09-25 (×6): qty 1

## 2011-09-25 MED ORDER — GABAPENTIN 300 MG PO CAPS
300.0000 mg | ORAL_CAPSULE | Freq: Two times a day (BID) | ORAL | Status: DC
  Administered 2011-09-25 – 2011-09-26 (×4): 300 mg via ORAL
  Filled 2011-09-25 (×5): qty 1

## 2011-09-25 MED ORDER — ZIPRASIDONE MESYLATE 20 MG IM SOLR: 10.0000 mg | Freq: Three times a day (TID) | INTRAMUSCULAR | Status: DC | PRN

## 2011-09-25 MED ORDER — OLANZAPINE 5 MG PO TABS
15.0000 mg | ORAL_TABLET | Freq: Every morning | ORAL | Status: DC
  Administered 2011-09-25 – 2011-09-26 (×2): 15 mg via ORAL
  Filled 2011-09-25: qty 1
  Filled 2011-09-25: qty 2
  Filled 2011-09-25: qty 3

## 2011-09-25 MED ORDER — HYDROCODONE-ACETAMINOPHEN 5-325 MG PO TABS
1.0000 | ORAL_TABLET | ORAL | Status: DC | PRN
  Administered 2011-09-25: 1 via ORAL
  Administered 2011-09-25: 2 via ORAL
  Administered 2011-09-26: 1 via ORAL
  Filled 2011-09-25: qty 2
  Filled 2011-09-25 (×2): qty 1

## 2011-09-25 MED ORDER — GABAPENTIN 600 MG PO TABS
600.0000 mg | ORAL_TABLET | Freq: Every day | ORAL | Status: DC
  Administered 2011-09-25: 600 mg via ORAL
  Filled 2011-09-25 (×4): qty 1

## 2011-09-25 MED ORDER — QUETIAPINE FUMARATE ER 50 MG PO TB24
100.0000 mg | ORAL_TABLET | Freq: Every day | ORAL | Status: DC
  Administered 2011-09-25: 100 mg via ORAL
  Filled 2011-09-25 (×2): qty 2

## 2011-09-25 MED ORDER — DULOXETINE HCL 60 MG PO CPEP
60.0000 mg | ORAL_CAPSULE | Freq: Every day | ORAL | Status: DC
  Administered 2011-09-25 – 2011-09-26 (×2): 60 mg via ORAL
  Filled 2011-09-25 (×2): qty 1

## 2011-09-25 MED ORDER — ZOLPIDEM TARTRATE 5 MG PO TABS: 5.0000 mg | ORAL_TABLET | Freq: Every evening | ORAL | Status: DC | PRN

## 2011-09-25 MED ORDER — IBUPROFEN 200 MG PO TABS: 600.0000 mg | ORAL_TABLET | Freq: Three times a day (TID) | ORAL | Status: DC | PRN

## 2011-09-25 MED ORDER — ACETAMINOPHEN 325 MG PO TABS: 650.0000 mg | ORAL_TABLET | ORAL | Status: DC | PRN

## 2011-09-25 MED ORDER — ZIPRASIDONE MESYLATE 20 MG IM SOLR
10.0000 mg | Freq: Once | INTRAMUSCULAR | Status: AC
  Administered 2011-09-25: 10 mg via INTRAMUSCULAR
  Filled 2011-09-25: qty 20
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
## 2011-09-25 NOTE — ED Provider Notes (Signed)
Pt w period of increased agitation. Pt offered reassurance, attempts to calm pt. Remains agitated, climbing out of stretcher, appears delusional.  Will rx geodon. Move to psych ed. telepsych pending.   Suzi Roots, MD 09/25/11 [phone]

## 2011-09-25 NOTE — ED Notes (Signed)
[id number]<NF> Expected date:<BR> Expected time:<BR> Means of arrival:<BR> Comments:<BR> CLOSED Between 16-17

## 2011-09-25 NOTE — ED Notes (Signed)
Bed:WA30<BR> Expected date:<BR> Expected time:<BR> Means of arrival:<BR> Comments:<BR>
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
## 2011-09-25 NOTE — Progress Notes (Signed)
[facility] ED CM consulted with ED psychiatrist and TCU RN. Cm spoke with pt who states she had scheduled an appointment with [facility] Dr Amada Kingfisher bonsu and she was living in a hotel [place] with plans to get a studio apartment soon that she saw on "craig list"

## 2011-09-25 NOTE — ED Notes (Signed)
Called telepsych to confirm fax receipt.  Page states fax went through.  Telepsych stated their fax machine is backed up.  Will call at 1500 to verify receipt.  Awaiting telepsych consult.  Paperwork and phone call from TCU has been completed.  Albesa Seen E. 2:56 PM 09/25/2011

## 2011-09-25 NOTE — ED Provider Notes (Addendum)
She was seen by Telepsych, type III, who recommends inpatient admission. They also recommend starting Geodon 10 mg IM every 8 hours when necessary agitation. ACT will place  Flint Melter, MD 09/25/11 1730  Flint Melter, MD 09/25/11 [phone]

## 2011-09-25 NOTE — ED Notes (Signed)
Left a message for Idalia Needle to call the patient per patient request.  Idalia Needle is at [employer] at [phone].  Gave call back number [phone].  Barrie Lyme 2:11 PM 09/25/2011

## 2011-09-25 NOTE — ED Notes (Signed)
Pt. Belongings are placed in locker #30. Bedside commode was placed in her room. Pt. Wheelchair was placed in the TCU equipment room. Wheelchair is labeled with her name on it.
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
## 2011-09-25 NOTE — ED Notes (Signed)
ALP bedside to speak with pt 
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
## 2011-09-26 ENCOUNTER — Encounter: Payer: Medicare Other | Admitting: Physical Medicine & Rehabilitation

## 2011-09-26 DIAGNOSIS — F319 Bipolar disorder, unspecified: Secondary | ICD-10-CM

## 2011-09-26 LAB — URINALYSIS, ROUTINE W REFLEX MICROSCOPIC
Ketones, ur: NEGATIVE mg/dL
Nitrite: NEGATIVE
Specific Gravity, Urine: 1.016 (ref 1.005–1.030)
pH: 7 (ref 5.0–8.0)

## 2011-09-26 LAB — URINE MICROSCOPIC-ADD ON

## 2011-09-26 MED ORDER — ALBUTEROL SULFATE HFA 108 (90 BASE) MCG/ACT IN AERS
2.0000 | INHALATION_SPRAY | Freq: Four times a day (QID) | RESPIRATORY_TRACT | Status: DC | PRN
  Administered 2011-09-26: 2 via RESPIRATORY_TRACT
  Filled 2011-09-26: qty 6.7

## 2011-09-26 MED ORDER — GABAPENTIN 300 MG PO CAPS
600.0000 mg | ORAL_CAPSULE | Freq: Every day | ORAL | Status: DC
  Filled 2011-09-26: qty 2

## 2011-09-26 MED ORDER — SALINE SPRAY 0.65 % NA SOLN
1.0000 | Freq: Once | NASAL | Status: AC
  Administered 2011-09-26: 1 via NASAL
  Filled 2011-09-26: qty 44

## 2011-09-26 MED ORDER — FLEET ENEMA 7-19 GM/118ML RE ENEM
1.0000 | ENEMA | Freq: Once | RECTAL | Status: AC
  Administered 2011-09-26: 1 via RECTAL
  Filled 2011-09-26: qty 1

## 2011-09-26 MED ORDER — NITROFURANTOIN MONOHYD MACRO 100 MG PO CAPS: 100.0000 mg | ORAL_CAPSULE | Freq: Two times a day (BID) | ORAL | Status: AC

## 2011-09-26 MED ORDER — WHITE PETROLATUM GEL
Status: DC | PRN
  Filled 2011-09-26: qty 5

## 2011-09-26 NOTE — ED Provider Notes (Addendum)
Pt resting, NAD.  No acute events reported from overnight shift.  Note persistent tachycardia on VS.  Will continue to monitor closely.  Pt has been recommended for inpt admission and stabilization.  ACT Team is working towards placement.  Tobin Chad, MD 09/26/11 0734  1019.  Pt reported constipation and dysuria to nursing.  She also has some wheezing on exam.  Ordered fleet enema and albuterol.  U/A is pending.  Tobin Chad, MD 09/26/11 1020  1556.  Pt has been evaluated by psych (Dr. Shela Commons).  At this time she does not meet criteria for inpt treatment.  She has been provided follow-up instructions.  Plan discharge home.  U/A consistent with UTI on review of labs.  Will treat with macrobid.  Tobin Chad, MD 09/26/11 1558  Tobin Chad, MD 09/26/11 1610
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
## 2011-09-26 NOTE — BHH Counselor (Signed)
D/c home per Dr. Annett Fabian provided patient with the appropriate referrals for follow up.

## 2011-09-27 NOTE — ED Provider Notes (Signed)
Medical screening examination/treatment/procedure(s) were performed by non-physician practitioner and as supervising physician I was immediately available for consultation/collaboration.    Jaiona Simien D Olubunmi Rothenberger, MD 09/27/11 1533 

## 2011-10-20 ENCOUNTER — Encounter (HOSPITAL_COMMUNITY): Payer: Self-pay | Admitting: Pharmacy Technician

## 2011-10-20 ENCOUNTER — Encounter (HOSPITAL_COMMUNITY): Payer: Self-pay

## 2011-10-20 ENCOUNTER — Encounter (HOSPITAL_COMMUNITY)
Admission: RE | Admit: 2011-10-20 | Discharge: 2011-10-20 | Disposition: A | Discharge status: Home / Self Care | Payer: Medicare Other | Source: Ambulatory Visit | Attending: Orthopedic Surgery | Admitting: Orthopedic Surgery

## 2011-10-20 DIAGNOSIS — N390 Urinary tract infection, site not specified: Secondary | ICD-10-CM | POA: Diagnosis present

## 2011-10-20 HISTORY — DX: Urinary tract infection, site not specified: N39.0

## 2011-10-20 LAB — PROTIME-INR
INR: 0.99 (ref 0.00–1.49)
Prothrombin Time: 13 seconds (ref 11.6–15.2)

## 2011-10-20 LAB — CBC WITH DIFFERENTIAL/PLATELET
Basophils Absolute: 0 10*3/uL (ref 0.0–0.1)
Basophils Relative: 0 % (ref 0–1)
Eosinophils Absolute: 0.3 10*3/uL (ref 0.0–0.7)
Eosinophils Relative: 4 % (ref 0–5)
HCT: 40.7 % (ref 36.0–46.0)
MCH: 28.1 pg (ref 26.0–34.0)
MCHC: 33.4 g/dL (ref 30.0–36.0)
MCV: 84.1 fL (ref 78.0–100.0)
Monocytes Absolute: 0.6 10*3/uL (ref 0.1–1.0)
Neutro Abs: 3.3 10*3/uL (ref 1.7–7.7)
RDW: 14.4 % (ref 11.5–15.5)

## 2011-10-20 LAB — URINALYSIS, ROUTINE W REFLEX MICROSCOPIC
Bilirubin Urine: NEGATIVE
Glucose, UA: NEGATIVE mg/dL
Ketones, ur: NEGATIVE mg/dL
Nitrite: NEGATIVE
Protein, ur: NEGATIVE mg/dL

## 2011-10-20 LAB — COMPREHENSIVE METABOLIC PANEL
AST: 14 U/L (ref 0–37)
Albumin: 3.8 g/dL (ref 3.5–5.2)
Calcium: 9.5 mg/dL (ref 8.4–10.5)
Creatinine, Ser: 0.67 mg/dL (ref 0.50–1.10)
Total Protein: 8.1 g/dL (ref 6.0–8.3)

## 2011-10-20 LAB — TYPE AND SCREEN: ABO/RH(D): O POS

## 2011-10-20 LAB — APTT: aPTT: 35 seconds (ref 24–37)

## 2011-10-20 LAB — URINE MICROSCOPIC-ADD ON

## 2011-10-20 LAB — SURGICAL PCR SCREEN: Staphylococcus aureus: POSITIVE — AB

## 2011-10-20 LAB — C-REACTIVE PROTEIN: CRP: 1.1 mg/dL — ABNORMAL HIGH (ref <0.60)

## 2011-10-20 NOTE — Progress Notes (Signed)
Pt PCR + for staph. Pt states she has transportation issues and may not be able to get to pharmacy. I requested that pt try to get prescription filled and call back if unable. May need to start medication DOS.

## 2011-10-20 NOTE — Pre-Procedure Instructions (Signed)
20 Tonya [name]  10/20/2011   Your procedure is scheduled on:  Tuesday September 24  Report to [facility] at 8:30 AM.  Call this number if you have problems the morning of surgery: [phone]   Remember:   Do not eat or drink:After Midnight.    Take these medicines the morning of surgery with A SIP OF WATER: Neurontin, Cymbalta. May take pain pill - hydrocodone - and Ativan. May use nasal spray.    Do not wear jewelry, make-up or nail polish.  Do not wear lotions, powders, or perfumes. You may wear deodorant.  Do not shave 48 hours prior to surgery. Men may shave face and neck.  Do not bring valuables to the hospital.  Contacts, dentures or bridgework may not be worn into surgery.  Leave suitcase in the car. After surgery it may be brought to your room.  For patients admitted to the hospital, checkout time is 11:00 AM the day of discharge.   Patients discharged the day of surgery will not be allowed to drive home.  Name and phone number of your driver: NA  Special Instructions: Incentive Spirometry - Practice and bring it with you on the day of surgery. Shower using CHG 2 nights before surgery and the night before surgery.  If you shower the day of surgery use CHG.  Use special wash - you have one bottle of CHG for all showers.  You should use approximately 1/3 of the bottle for each shower.   Please read over the following fact sheets that you were given: Pain Booklet, Coughing and Deep Breathing, Blood Transfusion Information and Surgical Site Infection Prevention

## 2011-10-21 LAB — URINE CULTURE: Colony Count: NO GROWTH

## 2011-10-23 ENCOUNTER — Encounter (HOSPITAL_COMMUNITY): Payer: Self-pay | Admitting: Orthopedic Surgery

## 2011-10-23 MED ORDER — LACTATED RINGERS IV SOLN
INTRAVENOUS | Status: DC
  Administered 2011-10-24: 10:00:00 via INTRAVENOUS

## 2011-10-23 MED ORDER — CHLORHEXIDINE GLUCONATE 4 % EX LIQD: 60.0000 mL | Freq: Once | CUTANEOUS | Status: DC

## 2011-10-23 MED ORDER — CEFAZOLIN SODIUM-DEXTROSE 2-3 GM-% IV SOLR
2.0000 g | INTRAVENOUS | Status: AC
  Administered 2011-10-24: 2 g via INTRAVENOUS
  Filled 2011-10-23: qty 50
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
## 2011-10-24 ENCOUNTER — Encounter (HOSPITAL_COMMUNITY): Payer: Self-pay | Admitting: Anesthesiology

## 2011-10-24 ENCOUNTER — Ambulatory Visit (HOSPITAL_COMMUNITY): Payer: Medicare Other

## 2011-10-24 ENCOUNTER — Encounter (HOSPITAL_COMMUNITY)
Admission: RE | Disposition: A | Discharge status: Skilled Nursing Facility | Payer: Self-pay | Source: Ambulatory Visit | Attending: Orthopedic Surgery

## 2011-10-24 ENCOUNTER — Encounter (HOSPITAL_COMMUNITY): Payer: Self-pay | Admitting: *Deleted

## 2011-10-24 ENCOUNTER — Inpatient Hospital Stay (HOSPITAL_COMMUNITY)
Admission: RE | Admit: 2011-10-24 | Discharge: 2011-10-30 | DRG: 493 | Disposition: A | Discharge status: Skilled Nursing Facility | Payer: Medicare Other | Source: Ambulatory Visit | Attending: Orthopedic Surgery | Admitting: Orthopedic Surgery

## 2011-10-24 ENCOUNTER — Inpatient Hospital Stay (HOSPITAL_COMMUNITY): Payer: Medicare Other

## 2011-10-24 ENCOUNTER — Ambulatory Visit (HOSPITAL_COMMUNITY): Payer: Medicare Other | Admitting: Anesthesiology

## 2011-10-24 DIAGNOSIS — S78119A Complete traumatic amputation at level between unspecified hip and knee, initial encounter: Secondary | ICD-10-CM

## 2011-10-24 DIAGNOSIS — IMO0002 Reserved for concepts with insufficient information to code with codable children: Principal | ICD-10-CM

## 2011-10-24 DIAGNOSIS — S82202K Unspecified fracture of shaft of left tibia, subsequent encounter for closed fracture with nonunion: Secondary | ICD-10-CM | POA: Diagnosis present

## 2011-10-24 DIAGNOSIS — Z472 Encounter for removal of internal fixation device: Secondary | ICD-10-CM

## 2011-10-24 DIAGNOSIS — K5900 Constipation, unspecified: Secondary | ICD-10-CM | POA: Diagnosis present

## 2011-10-24 DIAGNOSIS — F319 Bipolar disorder, unspecified: Secondary | ICD-10-CM | POA: Diagnosis present

## 2011-10-24 DIAGNOSIS — N390 Urinary tract infection, site not specified: Secondary | ICD-10-CM | POA: Diagnosis present

## 2011-10-24 HISTORY — DX: Suicide attempt, initial encounter: T14.91XA

## 2011-10-24 HISTORY — PX: TIBIA IM NAIL INSERTION: SHX2516

## 2011-10-24 HISTORY — PX: HARDWARE REMOVAL: SHX979

## 2011-10-24 LAB — GRAM STAIN

## 2011-10-24 SURGERY — INSERTION, INTRAMEDULLARY ROD, TIBIA
Anesthesia: General | Site: Leg Lower | Laterality: Left | Wound class: Clean

## 2011-10-24 MED ORDER — METOCLOPRAMIDE HCL 5 MG/ML IJ SOLN: 5.0000 mg | Freq: Three times a day (TID) | INTRAMUSCULAR | Status: DC | PRN

## 2011-10-24 MED ORDER — OLANZAPINE 7.5 MG PO TABS
15.0000 mg | ORAL_TABLET | Freq: Every day | ORAL | Status: DC
  Administered 2011-10-24 – 2011-10-29 (×6): 15 mg via ORAL
  Filled 2011-10-24 (×8): qty 2

## 2011-10-24 MED ORDER — METHOCARBAMOL 500 MG PO TABS
500.0000 mg | ORAL_TABLET | Freq: Four times a day (QID) | ORAL | Status: DC | PRN
  Filled 2011-10-24: qty 2
  Filled 2011-10-24: qty 1
  Filled 2011-10-24: qty 2

## 2011-10-24 MED ORDER — MIDAZOLAM HCL 5 MG/5ML IJ SOLN
INTRAMUSCULAR | Status: DC | PRN
  Administered 2011-10-24: 2 mg via INTRAVENOUS

## 2011-10-24 MED ORDER — SALINE SPRAY 0.65 % NA SOLN
1.0000 | NASAL | Status: DC | PRN
  Filled 2011-10-24: qty 44

## 2011-10-24 MED ORDER — DIPHENHYDRAMINE HCL 12.5 MG/5ML PO ELIX
12.5000 mg | ORAL_SOLUTION | ORAL | Status: DC | PRN
  Administered 2011-10-26: 25 mg via ORAL

## 2011-10-24 MED ORDER — DIPHENHYDRAMINE HCL 12.5 MG/5ML PO ELIX
12.5000 mg | ORAL_SOLUTION | Freq: Four times a day (QID) | ORAL | Status: DC | PRN
  Filled 2011-10-24: qty 5

## 2011-10-24 MED ORDER — METOCLOPRAMIDE HCL 10 MG PO TABS: 5.0000 mg | ORAL_TABLET | Freq: Three times a day (TID) | ORAL | Status: DC | PRN

## 2011-10-24 MED ORDER — ACETAMINOPHEN 10 MG/ML IV SOLN
1000.0000 mg | Freq: Four times a day (QID) | INTRAVENOUS | Status: AC
  Administered 2011-10-24 – 2011-10-25 (×4): 1000 mg via INTRAVENOUS
  Filled 2011-10-24 (×4): qty 100

## 2011-10-24 MED ORDER — HYDROMORPHONE HCL PF 1 MG/ML IJ SOLN
INTRAMUSCULAR | Status: AC
  Filled 2011-10-24: qty 1

## 2011-10-24 MED ORDER — NEOSTIGMINE METHYLSULFATE 1 MG/ML IJ SOLN
INTRAMUSCULAR | Status: DC | PRN
  Administered 2011-10-24: 4 mg via INTRAVENOUS

## 2011-10-24 MED ORDER — ONDANSETRON HCL 4 MG PO TABS: 4.0000 mg | ORAL_TABLET | Freq: Four times a day (QID) | ORAL | Status: DC | PRN

## 2011-10-24 MED ORDER — DULOXETINE HCL 60 MG PO CPEP
60.0000 mg | ORAL_CAPSULE | Freq: Every day | ORAL | Status: DC
  Administered 2011-10-24 – 2011-10-30 (×7): 60 mg via ORAL
  Filled 2011-10-24 (×7): qty 1

## 2011-10-24 MED ORDER — QUETIAPINE FUMARATE ER 50 MG PO TB24
100.0000 mg | ORAL_TABLET | Freq: Every day | ORAL | Status: DC
  Administered 2011-10-24 – 2011-10-29 (×6): 100 mg via ORAL
  Filled 2011-10-24 (×7): qty 2

## 2011-10-24 MED ORDER — LACTATED RINGERS IV SOLN
INTRAVENOUS | Status: DC | PRN
  Administered 2011-10-24 (×3): via INTRAVENOUS

## 2011-10-24 MED ORDER — POTASSIUM CHLORIDE IN NACL 20-0.9 MEQ/L-% IV SOLN
INTRAVENOUS | Status: DC
  Administered 2011-10-25: 10:00:00 via INTRAVENOUS
  Filled 2011-10-24 (×3): qty 1000

## 2011-10-24 MED ORDER — MORPHINE SULFATE (PF) 1 MG/ML IV SOLN
INTRAVENOUS | Status: AC
  Filled 2011-10-24: qty 25

## 2011-10-24 MED ORDER — PROPOFOL 10 MG/ML IV BOLUS
INTRAVENOUS | Status: DC | PRN
  Administered 2011-10-24: 200 mg via INTRAVENOUS

## 2011-10-24 MED ORDER — METHOCARBAMOL 100 MG/ML IJ SOLN
500.0000 mg | Freq: Four times a day (QID) | INTRAVENOUS | Status: DC | PRN
  Administered 2011-10-24: 1000 mg via INTRAVENOUS
  Filled 2011-10-24: qty 10

## 2011-10-24 MED ORDER — 0.9 % SODIUM CHLORIDE (POUR BTL) OPTIME
TOPICAL | Status: DC | PRN
  Administered 2011-10-24: 1000 mL

## 2011-10-24 MED ORDER — LORAZEPAM 0.5 MG PO TABS: 0.5000 mg | ORAL_TABLET | Freq: Four times a day (QID) | ORAL | Status: DC | PRN

## 2011-10-24 MED ORDER — MORPHINE SULFATE (PF) 1 MG/ML IV SOLN
INTRAVENOUS | Status: AC
  Administered 2011-10-24: 16:00:00 via INTRAVENOUS
  Administered 2011-10-25: 3 mg via INTRAVENOUS
  Administered 2011-10-25: 2 mg via INTRAVENOUS
  Administered 2011-10-25: 20:00:00 via INTRAVENOUS
  Administered 2011-10-25: 7.5 mg via INTRAVENOUS
  Administered 2011-10-25: 1 mg via INTRAVENOUS
  Administered 2011-10-25: 6 mg via INTRAVENOUS
  Administered 2011-10-25: 1.5 mg via INTRAVENOUS
  Filled 2011-10-24: qty 25

## 2011-10-24 MED ORDER — MEPERIDINE HCL 25 MG/ML IJ SOLN: 6.2500 mg | INTRAMUSCULAR | Status: DC | PRN

## 2011-10-24 MED ORDER — OXYCODONE HCL 5 MG PO TABS
5.0000 mg | ORAL_TABLET | ORAL | Status: DC | PRN
  Administered 2011-10-25 – 2011-10-26 (×4): 15 mg via ORAL
  Administered 2011-10-27: 5 mg via ORAL
  Administered 2011-10-27: 10 mg via ORAL
  Administered 2011-10-27 – 2011-10-28 (×3): 15 mg via ORAL
  Administered 2011-10-28: 10 mg via ORAL
  Filled 2011-10-24: qty 1
  Filled 2011-10-24 (×4): qty 3
  Filled 2011-10-24: qty 2
  Filled 2011-10-24 (×3): qty 3
  Filled 2011-10-24: qty 2

## 2011-10-24 MED ORDER — DOCUSATE SODIUM 100 MG PO CAPS
100.0000 mg | ORAL_CAPSULE | Freq: Two times a day (BID) | ORAL | Status: DC
  Administered 2011-10-24 – 2011-10-30 (×12): 100 mg via ORAL
  Filled 2011-10-24 (×13): qty 1

## 2011-10-24 MED ORDER — HYDROMORPHONE HCL PF 1 MG/ML IJ SOLN
0.2500 mg | INTRAMUSCULAR | Status: DC | PRN
  Administered 2011-10-24 (×4): 0.5 mg via INTRAVENOUS

## 2011-10-24 MED ORDER — CEFAZOLIN SODIUM 1-5 GM-% IV SOLN
1.0000 g | Freq: Four times a day (QID) | INTRAVENOUS | Status: AC
  Administered 2011-10-24 – 2011-10-25 (×3): 1 g via INTRAVENOUS
  Filled 2011-10-24 (×3): qty 50

## 2011-10-24 MED ORDER — GABAPENTIN 300 MG PO CAPS
300.0000 mg | ORAL_CAPSULE | Freq: Three times a day (TID) | ORAL | Status: DC
  Administered 2011-10-24 – 2011-10-30 (×18): 300 mg via ORAL
  Filled 2011-10-24 (×19): qty 1

## 2011-10-24 MED ORDER — NITROFURANTOIN MACROCRYSTAL 100 MG PO CAPS
100.0000 mg | ORAL_CAPSULE | Freq: Two times a day (BID) | ORAL | Status: DC
  Administered 2011-10-24 – 2011-10-30 (×12): 100 mg via ORAL
  Filled 2011-10-24 (×13): qty 1

## 2011-10-24 MED ORDER — GLYCOPYRROLATE 0.2 MG/ML IJ SOLN
INTRAMUSCULAR | Status: DC | PRN
  Administered 2011-10-24: 0.6 mg via INTRAVENOUS

## 2011-10-24 MED ORDER — LIDOCAINE HCL (CARDIAC) 20 MG/ML IV SOLN
INTRAVENOUS | Status: DC | PRN
  Administered 2011-10-24: 50 mg via INTRAVENOUS

## 2011-10-24 MED ORDER — MUPIROCIN 2 % EX OINT
TOPICAL_OINTMENT | Freq: Two times a day (BID) | CUTANEOUS | Status: DC
  Administered 2011-10-24: 1 via NASAL
  Administered 2011-10-24 – 2011-10-28 (×9): via NASAL
  Filled 2011-10-24: qty 22

## 2011-10-24 MED ORDER — ONDANSETRON HCL 4 MG/2ML IJ SOLN: 4.0000 mg | Freq: Four times a day (QID) | INTRAMUSCULAR | Status: DC | PRN

## 2011-10-24 MED ORDER — PROMETHAZINE HCL 25 MG/ML IJ SOLN: 6.2500 mg | INTRAMUSCULAR | Status: DC | PRN

## 2011-10-24 MED ORDER — MIDAZOLAM HCL 2 MG/2ML IJ SOLN: 0.5000 mg | Freq: Once | INTRAMUSCULAR | Status: DC | PRN

## 2011-10-24 MED ORDER — MUPIROCIN 2 % EX OINT
TOPICAL_OINTMENT | CUTANEOUS | Status: AC
  Administered 2011-10-24: 1 via NASAL
  Filled 2011-10-24: qty 22

## 2011-10-24 MED ORDER — ONDANSETRON HCL 4 MG/2ML IJ SOLN
INTRAMUSCULAR | Status: DC | PRN
  Administered 2011-10-24: 4 mg via INTRAVENOUS

## 2011-10-24 MED ORDER — SODIUM CHLORIDE 0.9 % IJ SOLN: 9.0000 mL | INTRAMUSCULAR | Status: DC | PRN

## 2011-10-24 MED ORDER — DIPHENHYDRAMINE HCL 50 MG/ML IJ SOLN: 12.5000 mg | Freq: Four times a day (QID) | INTRAMUSCULAR | Status: DC | PRN

## 2011-10-24 MED ORDER — FENTANYL CITRATE 0.05 MG/ML IJ SOLN
INTRAMUSCULAR | Status: DC | PRN
  Administered 2011-10-24 (×6): 50 ug via INTRAVENOUS
  Administered 2011-10-24: 100 ug via INTRAVENOUS
  Administered 2011-10-24 (×2): 50 ug via INTRAVENOUS
  Administered 2011-10-24: 100 ug via INTRAVENOUS

## 2011-10-24 MED ORDER — NALOXONE HCL 0.4 MG/ML IJ SOLN: 0.4000 mg | INTRAMUSCULAR | Status: DC | PRN

## 2011-10-24 MED ORDER — ROCURONIUM BROMIDE 100 MG/10ML IV SOLN
INTRAVENOUS | Status: DC | PRN
  Administered 2011-10-24 (×3): 10 mg via INTRAVENOUS
  Administered 2011-10-24: 50 mg via INTRAVENOUS

## 2011-10-24 MED ORDER — ENOXAPARIN SODIUM 40 MG/0.4ML ~~LOC~~ SOLN
40.0000 mg | SUBCUTANEOUS | Status: DC
  Administered 2011-10-25 – 2011-10-30 (×6): 40 mg via SUBCUTANEOUS
  Filled 2011-10-24 (×8): qty 0.4

## 2011-10-24 SURGICAL SUPPLY — 77 items
BANDAGE ELASTIC 4 VELCRO ST LF (GAUZE/BANDAGES/DRESSINGS) ×2 IMPLANT
BANDAGE ELASTIC 6 VELCRO ST LF (GAUZE/BANDAGES/DRESSINGS) ×2 IMPLANT
BANDAGE ESMARK 6X9 LF (GAUZE/BANDAGES/DRESSINGS) IMPLANT
BANDAGE GAUZE ELAST BULKY 4 IN (GAUZE/BANDAGES/DRESSINGS) ×4 IMPLANT
BLADE SURG 10 STRL SS (BLADE) ×2 IMPLANT
BNDG COHESIVE 4X5 TAN STRL (GAUZE/BANDAGES/DRESSINGS) IMPLANT
BNDG COHESIVE 6X5 TAN STRL LF (GAUZE/BANDAGES/DRESSINGS) IMPLANT
BNDG ESMARK 6X9 LF (GAUZE/BANDAGES/DRESSINGS)
BRUSH SCRUB DISP (MISCELLANEOUS) ×4 IMPLANT
CLEANER TIP ELECTROSURG 2X2 (MISCELLANEOUS) IMPLANT
CLOTH BEACON ORANGE TIMEOUT ST (SAFETY) ×2 IMPLANT
COVER SURGICAL LIGHT HANDLE (MISCELLANEOUS) ×4 IMPLANT
CUFF TOURNIQUET SINGLE 18IN (TOURNIQUET CUFF) IMPLANT
CUFF TOURNIQUET SINGLE 24IN (TOURNIQUET CUFF) IMPLANT
CUFF TOURNIQUET SINGLE 34IN LL (TOURNIQUET CUFF) IMPLANT
DRAPE C-ARM 42X72 X-RAY (DRAPES) ×2 IMPLANT
DRAPE C-ARMOR (DRAPES) ×2 IMPLANT
DRAPE INCISE IOBAN 66X45 STRL (DRAPES) ×2 IMPLANT
DRAPE OEC MINIVIEW 54X84 (DRAPES) IMPLANT
DRAPE ORTHO SPLIT 77X108 STRL (DRAPES)
DRAPE SURG ORHT 6 SPLT 77X108 (DRAPES) IMPLANT
DRAPE U-SHAPE 47X51 STRL (DRAPES) ×2 IMPLANT
DRSG ADAPTIC 3X8 NADH LF (GAUZE/BANDAGES/DRESSINGS) ×2 IMPLANT
ELECT REM PT RETURN 9FT ADLT (ELECTROSURGICAL) ×2
ELECTRODE REM PT RTRN 9FT ADLT (ELECTROSURGICAL) ×1 IMPLANT
EVACUATOR 1/8 PVC DRAIN (DRAIN) IMPLANT
GLOVE BIO SURGEON STRL SZ7.5 (GLOVE) ×2 IMPLANT
GLOVE BIO SURGEON STRL SZ8 (GLOVE) ×2 IMPLANT
GLOVE BIO SURGEON STRL SZ8.5 (GLOVE) ×2 IMPLANT
GLOVE BIOGEL PI IND STRL 7.5 (GLOVE) ×1 IMPLANT
GLOVE BIOGEL PI IND STRL 8 (GLOVE) ×1 IMPLANT
GLOVE BIOGEL PI INDICATOR 7.5 (GLOVE) ×1
GLOVE BIOGEL PI INDICATOR 8 (GLOVE) ×1
GOWN PREVENTION PLUS XLARGE (GOWN DISPOSABLE) ×2 IMPLANT
GOWN STRL NON-REIN LRG LVL3 (GOWN DISPOSABLE) ×4 IMPLANT
GUIDEWIRE BALL NOSE 80CM (WIRE) ×2 IMPLANT
KIT BASIN OR (CUSTOM PROCEDURE TRAY) ×2 IMPLANT
KIT ROOM TURNOVER OR (KITS) ×2 IMPLANT
MANIFOLD NEPTUNE II (INSTRUMENTS) ×2 IMPLANT
NAIL TIBIAL 9MMX34.5CM (Nail) ×2 IMPLANT
NEEDLE 22X1 1/2 (OR ONLY) (NEEDLE) IMPLANT
NS IRRIG 1000ML POUR BTL (IV SOLUTION) ×2 IMPLANT
PACK GENERAL/GYN (CUSTOM PROCEDURE TRAY) IMPLANT
PACK ORTHO EXTREMITY (CUSTOM PROCEDURE TRAY) ×2 IMPLANT
PAD ARMBOARD 7.5X6 YLW CONV (MISCELLANEOUS) ×4 IMPLANT
PADDING CAST COTTON 6X4 STRL (CAST SUPPLIES) ×4 IMPLANT
SCREW ACECAP 32MM (Screw) ×2 IMPLANT
SCREW ACECAP 34MM (Screw) ×4 IMPLANT
SCREW ACECAP 36MM (Screw) ×2 IMPLANT
SCREW ACECAP 38MM (Screw) ×2 IMPLANT
SCREW ACECAP 42MM (Screw) ×2 IMPLANT
SCREW PROXIMAL DEPUY (Screw) ×2 IMPLANT
SCREW PRXML FT 60X5.5XNS LF (Screw) ×2 IMPLANT
SPONGE GAUZE 4X4 12PLY (GAUZE/BANDAGES/DRESSINGS) ×2 IMPLANT
SPONGE LAP 18X18 X RAY DECT (DISPOSABLE) ×4 IMPLANT
SPONGE SCRUB IODOPHOR (GAUZE/BANDAGES/DRESSINGS) ×2 IMPLANT
STAPLER VISISTAT 35W (STAPLE) IMPLANT
STOCKINETTE IMPERVIOUS LG (DRAPES) IMPLANT
STRIP CLOSURE SKIN 1/2X4 (GAUZE/BANDAGES/DRESSINGS) IMPLANT
SUCTION FRAZIER TIP 10 FR DISP (SUCTIONS) IMPLANT
SUT ETHILON 3 0 PS 1 (SUTURE) ×4 IMPLANT
SUT PDS AB 0 CT 36 (SUTURE) ×2 IMPLANT
SUT PDS AB 2-0 CT1 27 (SUTURE) ×2 IMPLANT
SUT PROLENE 3 0 PS 2 (SUTURE) IMPLANT
SUT VIC AB 0 CT1 27 (SUTURE) ×1
SUT VIC AB 0 CT1 27XBRD ANBCTR (SUTURE) ×1 IMPLANT
SUT VIC AB 2-0 CT1 27 (SUTURE) ×2
SUT VIC AB 2-0 CT1 TAPERPNT 27 (SUTURE) ×2 IMPLANT
SUT VIC AB 2-0 CT3 27 (SUTURE) IMPLANT
SYR CONTROL 10ML LL (SYRINGE) IMPLANT
TOWEL OR 17X24 6PK STRL BLUE (TOWEL DISPOSABLE) ×4 IMPLANT
TOWEL OR 17X26 10 PK STRL BLUE (TOWEL DISPOSABLE) ×4 IMPLANT
TRAY FOLEY CATH 14FR (SET/KITS/TRAYS/PACK) IMPLANT
TUBE CONNECTING 12X1/4 (SUCTIONS) ×2 IMPLANT
UNDERPAD 30X30 INCONTINENT (UNDERPADS AND DIAPERS) ×2 IMPLANT
WATER STERILE IRR 1000ML POUR (IV SOLUTION) ×4 IMPLANT
YANKAUER SUCT BULB TIP NO VENT (SUCTIONS) ×2 IMPLANT

## 2011-10-24 NOTE — Anesthesia Postprocedure Evaluation (Signed)
  Anesthesia Post-op Note  Patient: Tonya [name]  Procedure(s) Performed: Procedure(s) (LRB) with comments: INTRAMEDULLARY (IM) NAIL TIBIAL (Left) HARDWARE REMOVAL (Left) - left tibial   Patient Location: PACU  Anesthesia Type: General  Level of Consciousness: awake, alert  and oriented  Airway and Oxygen Therapy: Patient Spontanous Breathing and Patient connected to nasal cannula oxygen  Post-op Pain: mild  Post-op Assessment: Post-op Vital signs reviewed  Post-op Vital Signs: Reviewed  Complications: No apparent anesthesia complications

## 2011-10-24 NOTE — Transfer of Care (Signed)
Immediate Anesthesia Transfer of Care Note  Patient: Tonya [name]  Procedure(s) Performed: Procedure(s) (LRB) with comments: INTRAMEDULLARY (IM) NAIL TIBIAL (Left) HARDWARE REMOVAL (Left) - left tibial   Patient Location: PACU  Anesthesia Type: General  Level of Consciousness: awake and oriented  Airway & Oxygen Therapy: Patient Spontanous Breathing and Patient connected to face mask oxygen  Post-op Assessment: Report given to PACU RN and Post -op Vital signs reviewed and stable  Post vital signs: Reviewed and stable  Complications: No apparent anesthesia complications

## 2011-10-24 NOTE — Preoperative (Signed)
Beta Blockers   Reason not to administer Beta Blockers:Not Applicable 

## 2011-10-24 NOTE — Progress Notes (Signed)
Notified Dr. Jean Rosenthal that pt. Has a body piercing on left lower lip that is clamped and the pt. States it will not come out .States she has to have it professionally removed.

## 2011-10-24 NOTE — Progress Notes (Signed)
Pt transferred to main pacu.
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
## 2011-10-24 NOTE — Progress Notes (Signed)
Report given to Stephanie RN.

## 2011-10-24 NOTE — Brief Op Note (Signed)
10/24/2011  2:20 PM  PATIENT:  Tonya [name]  26 y.o. female  PRE-OPERATIVE DIAGNOSIS:  nonunion left tibial   POST-OPERATIVE DIAGNOSIS:  non-union left tibia  PROCEDURE:  Procedure(s) (LRB) with comments: INTRAMEDULLARY (IM) NAIL TIBIAL (Left) HARDWARE REMOVAL (Left) - left tibial   SURGEON:  Surgeon(s) and Role:    * Budd Palmer, MD - Primary  PHYSICIAN ASSISTANT: Montez Morita, [facility]  ANESTHESIA:   general  EBL:  Total I/O In: 1000 [I.V.:1000] Out: 150 [Blood:150]  BLOOD ADMINISTERED:none  DRAINS: none   LOCAL MEDICATIONS USED:  NONE  SPECIMEN:  Source of Specimen:  reamings tibial nonunion  DISPOSITION OF SPECIMEN:  micro  COUNTS:  YES  TOURNIQUET:  * No tourniquets in log *  DICTATION: .Other Dictation: Dictation Number [id number]  PLAN OF CARE: Admit to inpatient  PATIENT DISPOSITION:  PACU - hemodynamically stable.   Delay start of Pharmacological VTE agent (>24hrs) due to surgical blood loss or risk of bleeding: no

## 2011-10-24 NOTE — Consult Note (Signed)
I have seen and examined the patient. I agree with the findings above. I discussed with the patient the risks and benefits of surgery, including the possibility of infection, nerve injury, vessel injury, wound breakdown, arthritis, persistent nonunion, symptomatic hardware, DVT/ PE, loss of motion, and need for further surgery among others.  She understands these risks and wishes to proceed.  Budd Palmer, MD 10/24/2011 10:39 AM

## 2011-10-25 ENCOUNTER — Encounter (HOSPITAL_COMMUNITY): Payer: Self-pay | Admitting: Orthopedic Surgery

## 2011-10-25 DIAGNOSIS — S82202K Unspecified fracture of shaft of left tibia, subsequent encounter for closed fracture with nonunion: Secondary | ICD-10-CM | POA: Diagnosis present

## 2011-10-25 DIAGNOSIS — F319 Bipolar disorder, unspecified: Secondary | ICD-10-CM | POA: Diagnosis present

## 2011-10-25 MED ORDER — OXYCODONE-ACETAMINOPHEN 5-325 MG PO TABS
1.0000 | ORAL_TABLET | Freq: Four times a day (QID) | ORAL | Status: DC | PRN
  Administered 2011-10-26 – 2011-10-30 (×7): 2 via ORAL
  Filled 2011-10-25 (×8): qty 2

## 2011-10-25 MED ORDER — PNEUMOCOCCAL VAC POLYVALENT 25 MCG/0.5ML IJ INJ
0.5000 mL | INJECTION | INTRAMUSCULAR | Status: AC
  Administered 2011-10-26: 0.5 mL via INTRAMUSCULAR
  Filled 2011-10-25: qty 0.5

## 2011-10-25 MED ORDER — INFLUENZA VIRUS VACC SPLIT PF IM SUSP
0.5000 mL | INTRAMUSCULAR | Status: AC
  Administered 2011-10-26: 0.5 mL via INTRAMUSCULAR
  Filled 2011-10-25: qty 0.5

## 2011-10-25 NOTE — Progress Notes (Signed)
Utilization review completed. Giorgi Debruin, RN, BSN. 

## 2011-10-25 NOTE — Progress Notes (Signed)
Physical Therapy Evaluation Patient Details Name: Tonya [name] MRN: [id number] DOB: 1985-11-10 Today's Date: 10/25/2011 Time: 7829-5621 PT Time Calculation (min): 14 min  PT Assessment / Plan / Recommendation Clinical Impression  Pt is 26 yo female who returns for revision of left tibial IM nail after multitrauma from a suicide attempt which led to a right AKA.  Pt performed a posterior transfer out of bed with full support of the LLE.  Recommend SNF at d/c since pt is currently TDWB left.  PT will continue to follow to promote independence with mobility.    PT Assessment  Patient needs continued PT services    Follow Up Recommendations  Skilled nursing facility    Barriers to Discharge Decreased caregiver support was homeless PTA    Equipment Recommendations  None recommended by PT    Recommendations for Other Services     Frequency Min 3X/week    Precautions / Restrictions Precautions Precautions: Fall Restrictions Weight Bearing Restrictions: Yes LLE Weight Bearing: Touchdown weight bearing   Pertinent Vitals/Pain 8/10 left leg pain, PCA used by pt      Mobility  Bed Mobility Bed Mobility: Supine to Sit;Sit to Supine Supine to Sit: 7: Independent;HOB flat Sit to Supine: 7: Independent;HOB flat Transfers Transfers: Counselling psychologist Transfer: 1: +2 Total assist;To level surface Anterior-Posterior Transfers: Patient Percentage: 80% Details for Transfer Assistance: pt scooted self around in bed to line up with recliner, left leg supported throughout transfer.  Pt scooted self backards into recliner with min-guard A from behind for vc's and safety and continued support of the left leg from the front.  NT present for transfer to see how to help pt back into bed. Ambulation/Gait Ambulation/Gait Assistance: Not tested (comment) (unable due to TDWB) Stairs: No Wheelchair Mobility Wheelchair Mobility: No    Shoulder Instructions       Exercises     PT Diagnosis: Acute pain  PT Problem List: Decreased strength;Decreased mobility;Pain PT Treatment Interventions:     PT Goals Acute Rehab PT Goals PT Goal Formulation: With patient Time For Goal Achievement: 11/01/11 Potential to Achieve Goals: Good Pt will Transfer Bed to Chair/Chair to Bed: with min assist PT Transfer Goal: Bed to Chair/Chair to Bed - Progress: Goal set today Pt will Propel Wheelchair: 51 - 150 feet;with modified independence PT Goal: Propel Wheelchair - Progress: Goal set today  Visit Information  Last PT Received On: 10/25/11 Assistance Needed: +2 (safety and to hold leg)    Subjective Data  Subjective: I just tried out my new leg once Patient Stated Goal: be able to walk   Prior Functioning  Home Living Lives With: Alone Available Help at Discharge: Skilled Nursing Facility Type of Home: Homeless Home Adaptive Equipment: Wheelchair - manual Additional Comments: pt was staying at homeless shelter prior to this admit and will need placement.  She does not want to go back to the same SNF as last time.  She has a w/c which she says is broken and needs repair. Prior Function Level of Independence: Independent with assistive device(s) Able to Take Stairs?: No Driving: No Vocation: Unemployed Communication Communication: No difficulties    Cognition  Overall Cognitive Status: Appears within functional limits for tasks assessed/performed Arousal/Alertness: Awake/alert Orientation Level: Oriented X4 / Intact Behavior During Session: Flat affect Cognition - Other Comments: per RN, pt appears depressed, seemed this way on eval as well    Extremity/Trunk Assessment Right Upper Extremity Assessment RUE ROM/Strength/Tone: Within functional levels RUE Sensation: WFL -  Light Touch RUE Coordination: WFL - gross motor Left Upper Extremity Assessment LUE ROM/Strength/Tone: Within functional levels LUE Sensation: WFL - Light Touch LUE  Coordination: WFL - gross motor Right Lower Extremity Assessment RLE ROM/Strength/Tone: Deficits RLE ROM/Strength/Tone Deficits: pt with R AKA, soon to get prosthesis, pt able to move right leg freely in bed and demonstrates full ROM, strength not assessed because not functional for transfer RLE Sensation: WFL - Light Touch RLE Coordination: WFL - gross motor Left Lower Extremity Assessment LLE ROM/Strength/Tone: Unable to fully assess;Due to precautions (pt unable to lift left leg) LLE Sensation: WFL - Light Touch Trunk Assessment Trunk Assessment: Normal   Balance Balance Balance Assessed: Yes Dynamic Sitting Balance Dynamic Sitting - Balance Support: Feet unsupported;Bilateral upper extremity supported Dynamic Sitting - Level of Assistance: 7: Independent  End of Session PT - End of Session Activity Tolerance: Patient tolerated treatment well Patient left: in chair;with call bell/phone within reach Nurse Communication: Mobility status  GP   Lyanne Co, PT  Acute Rehab Services  [phone]   Lyanne Co 10/25/2011, 1:34 PM

## 2011-10-25 NOTE — Progress Notes (Signed)
Orthopaedic Trauma Service (OTS)  Subjective: 1 Day Post-Op Procedure(s) (LRB): INTRAMEDULLARY (IM) NAIL TIBIAL (Left) HARDWARE REMOVAL (Left)    Doing ok Got some sleep last night Really hungry this am No new issues Denies CP, no SOB No N/V/D  Objective: Current Vitals Blood pressure 112/65, pulse 96, temperature 98.2 F (36.8 C), temperature source Axillary, resp. rate 19, last menstrual period 09/21/2011, SpO2 91.00%. Vital signs in last 24 hours: Temp:  [97 F (36.1 C)-98.2 F (36.8 C)] 98.2 F (36.8 C) (09/25 0654) Pulse Rate:  [74-118] 96  (09/25 0654) Resp:  [16-29] 19  (09/25 0813) BP: (110-161)/(64-94) 112/65 mmHg (09/25 0654) SpO2:  [90 %-98 %] 91 % (09/25 0813) FiO2 (%):  [46 %-47 %] 46 % (09/25 0543)  Intake/Output from previous day: 09/24 0701 - 09/25 0700 In: 2300 [P.O.:100; I.V.:2200] Out: 150 [Blood:150] Intake/Output      09/24 0701 - 09/25 0700 09/25 0701 - 09/26 0700   P.O. 100    I.V. 2200    Total Intake 2300    Blood 150    Total Output 150    Net +2150         Urine Occurrence 3 x      LABS No results found for this basename: HGB:5 in the last 72 hours No results found for this basename: WBC:2,RBC:2,HCT:2,PLT:2 in the last 72 hours No results found for this basename: NA:2,K:2,CL:2,CO2:2,BUN:2,CREATININE:2,GLUCOSE:2,CALCIUM:2 in the last 72 hours No results found for this basename: LABPT:2,INR:2 in the last 72 hours   Physical Exam  Gen: Awake and alert Lungs:unlabored Cardiac:reg Abd:NT Ext:     Left Lower Extremity  Dressing c/d/i  Ext is warm  + DP pulse  Distal motor and sensory functions intact  No DCT  Swelling stable     Imaging Dg Tibia/fibula Left  10/24/2011  *RADIOLOGY REPORT*  Clinical Data: Nonunion left tibial fracture  DG C-ARM 61-120 MIN,LEFT TIBIA AND FIBULA - 2 VIEW  Comparison: None.  Findings: C-arm films document exchange of left tibial nail.  No adverse features.  IMPRESSION: As above.   Original  Report Authenticated By: Elsie Stain, M.D.    Dg Tibia/fibula Left Port  10/24/2011  *RADIOLOGY REPORT*  Clinical Data: 26 year old female postop.  PORTABLE LEFT TIBIA AND FIBULA - 2 VIEW  Comparison: 1358 hours the same day and earlier.  Findings: Portable AP and cross-table lateral views of the left tib- fib.  Left tibia intramedullary rod with multilevel interlocking cortical screws.  These traverse the chronic tibial shaft deformities.  Hardware appears intact.  Alignment within normal limits.  The fibula appears intact.  IMPRESSION: Left tibia ORIF with no adverse features.   Original Report Authenticated By: Harley Hallmark, M.D.    Dg C-arm 61-120 Min  10/24/2011  *RADIOLOGY REPORT*  Clinical Data: Nonunion left tibial fracture  DG C-ARM 61-120 MIN,LEFT TIBIA AND FIBULA - 2 VIEW  Comparison: None.  Findings: C-arm films document exchange of left tibial nail.  No adverse features.  IMPRESSION: As above.   Original Report Authenticated By: Elsie Stain, M.D.     Assessment/Plan: 1 Day Post-Op Procedure(s) (LRB): INTRAMEDULLARY (IM) NAIL TIBIAL (Left) HARDWARE REMOVAL (Left)  26 y/o female with L tibial shaft nonunion  1. 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 R AKA  4 months post aka   No issues with AKA, stable  3. Depression   Continue with meds 4. UTI  Present pre-admission   On abx  Finish course 5. FEN  Diet as tolerated 6. Pain  Continue with PCA for another 24 hours  IV tylenol through today  Start percocet tomorrow  Ok to have Oxy IR with PCA as pt monitored with EtCO2 and pulse ox 7. Activity  Up as tolerated, bed to chair  TDWB L leg to help with transfers 8. Dispo  SW consult  Pt will need SNF, she was living at a shelter prior to surgery and this environment is not appropriate for her recovery  Pt will be stable for SNF tomorrow or Friday  at the latest   Mearl Latin, PA-C [facility] [phone] (P) 10/25/2011, 8:14 AM

## 2011-10-25 NOTE — Op Note (Signed)
NAMESHELMA, [name] NO.:  [id number]  MEDICAL RECORD NO.:  [id number]  LOCATION:  5N09C                        FACILITY:  [facility]  PHYSICIAN:  Doralee Albino. Carola Frost, M.D. DATE OF BIRTH:  01-16-1986  DATE OF PROCEDURE:  10/24/2011 DATE OF DISCHARGE:                              OPERATIVE REPORT   PREOPERATIVE DIAGNOSIS:  Nonunion of left tibia status post open segmental fracture.  POSTOPERATIVE DIAGNOSIS:  Nonunion of left tibia status post open segmental fracture.  PROCEDURE: 1. Intramedullary nailing of the left tibia using a Biomet 9 x 345 mm     statically locked VersaNail with 2 blocking screws. 2. Removal of hardware.  SURGEON:  Doralee Albino. Carola Frost, MD  ASSISTANT:  Mearl Latin, PA  ANESTHESIA:  General.  COMPLICATIONS:  None.  SPECIMENS:  Two anaerobic, aerobic reaming sent for Gram stain and culture.  ESTIMATED BLOOD LOSS:  150 mL of reaming.  DISPOSITION:  To PACU.  CONDITION:  Stable.  BRIEF SUMMARY AND INDICATION FOR PROCEDURE:  Tonya [name] is a 26 year old female, multitrauma patient who had a right above-knee amputation in addition to a left segmental open tib-fib fracture.  She was treated with intramedullary nailing after a stage washout and most recently was seen back in the office with union of the proximal segment but nonunion of the distal and new onset varus angulation.  As the hardware was bent, I did discuss removal and recommended it prior to catastrophic failure. She understood the risk of this surgery to include infection, nerve injury, vessel injury, DVT, PE, loss of motion, persistent nonunion, need for further surgery.  She did wish to proceed.  BRIEF DESCRIPTION OF PROCEDURE:  Tonya [name] was administered preoperative antibiotics as her sed rate, CRP were negative for suspected infection.  Old incisions were made distally with removal of the locking bolts, say 1.  Proximally we were able to use the more medial knee  incision to access the nail but this was quite difficult. The soft tissue dissection seemed to be much harder because of her scarring which was extensive.  The nail was then withdrawn after engaging the threads.  Two blocking screws were placed in the concave side of the fracture deformity to force the patient to appropriate valgus and this was then followed with sequential reaming and placement of the nail.  Final images showed appropriate restoration of length, alignment, and angulation.  New screws were placed distally with two 34 mm screws and proximally with two 60 mm screws.  The knee wound was extensively irrigated.  The hair around the medial aspect of the patella was irrigated thoroughly and repaired with #1 PDS, then 0 PDS, and 2-0 and nylon for the skin.  Sterile gently compressive dressing was applied.  The patient was awakened from anesthesia and transported to the PACU in stable condition.  Tonya Morita, PA-C assisted me throughout these procedures and was required as he held the leg while I placed the blocking screws and while I controlled angulation with correction of the varus deformity during his sequential reaming and nail placement.  He also assisted me with wound closure.  PROGNOSIS:  Given Ms. Rittenhouse' near bridging bone  in good stability along the posterior cortex and the hypertrophic callus along the lateral cortex, I would anticipate her to go on and achieve union of those 2 cortices followed by closure around the remaining anterior and medial cortices with this exchange nailing procedure.  Should this be unsuccessful with new growth over the ensuing 2-3 months, then I would consider a direct bone grafting.  We had outstanding stability today and were able to protect the fracture environment and maintained tension on the soft tissues.  We will follow up for cultures to make sure there is no sign of infection.  She will continue to require considerable help on the  social work front given her current residence in homeless shelter. She is not yet progressed with her prosthesis and much remains in this area.     Doralee Albino. Carola Frost, M.D.     [facility]/MEDQ  D:  10/24/2011  T:  10/25/2011  Job:  [id number]

## 2011-10-25 NOTE — Progress Notes (Signed)
Orthopedic Tech Progress Note Patient Details:  Spirit Tonya [name] 1985/06/28 [id number]  Ortho Devices Ortho Device/Splint Location: trapeze bar Ortho Device/Splint Interventions: Application   Cammer, Mickie Bail 10/25/2011, 4:43 PM

## 2011-10-25 NOTE — Progress Notes (Addendum)
Clinical Social Work Department CLINICAL SOCIAL WORK PLACEMENT NOTE 10/25/2011  Patient:  Tonya [name], Tonya [name]  Account Number:  [id number] Admit date:  10/24/2011  Clinical Social Worker:  Unk Lightning, LCSW  Date/time:  10/25/2011 04:00 PM  Clinical Social Work is seeking post-discharge placement for this patient at the following level of care:   SKILLED NURSING   (*CSW will update this form in Epic as items are completed)   10/25/2011  Patient/family provided with [facility] Department of Clinical Social Work's list of facilities offering this level of care within the geographic area requested by the patient (or if unable, by the patient's family).  10/25/2011  Patient/family informed of their freedom to choose among providers that offer the needed level of care, that participate in Medicare, Medicaid or managed care program needed by the patient, have an available bed and are willing to accept the patient.  10/25/2011  Patient/family informed of [facility]' ownership interest in [facility], as well as of the fact that they are under no obligation to receive care at this facility.  PASARR submitted to [employer] on 10/25/2011 PASARR number received from [employer] on 10/30/2011  FL2 transmitted to all facilities in geographic area requested by pt/family on  10/25/2011 FL2 transmitted to all facilities within larger geographic area on NA  Patient informed that his/her managed care company has contracts with or will negotiate with  certain facilities, including the following:  NA   Patient/family informed of bed offers received:  10/30/2011 Patient chooses bed at  [facility] (only bed offer received) Physician recommends and patient chooses bed at    Patient to be transferred to 10/30/11 on  10/30/2011 Patient to be transferred to facility by Ambulance  The following physician request were entered in Epic:   Additional Comments: Received PASARR 2 number for patient today. Bed  offer (only one) received and accepted by patient to [facility]  Ok per patient and notified SNF and pt's nurse- [name] of d/c.  Pt's belongings and personal wheelchair are in her room. Spoke with [name]- Admissions at [facility]. She is agreeable to come and pick up pt's chair and belongings tomorrow.  Will request Medicaid SNF authorization for patient. Patient requested a call to her mother who lives in [place]- [name]-- [phone]. Spoke with [name] and notified her of pt's disposition to [facility].  Patient and her mother were appreciative.  [name]  [phone]
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
## 2011-10-26 LAB — CBC
Hemoglobin: 10.9 g/dL — ABNORMAL LOW (ref 12.0–15.0)
MCH: 27.9 pg (ref 26.0–34.0)
Platelets: 196 10*3/uL (ref 150–400)
RBC: 3.9 MIL/uL (ref 3.87–5.11)
WBC: 9.3 10*3/uL (ref 4.0–10.5)

## 2011-10-26 LAB — BASIC METABOLIC PANEL
CO2: 28 mEq/L (ref 19–32)
Chloride: 100 mEq/L (ref 96–112)
Glucose, Bld: 118 mg/dL — ABNORMAL HIGH (ref 70–99)
Potassium: 3.9 mEq/L (ref 3.5–5.1)
Sodium: 138 mEq/L (ref 135–145)

## 2011-10-26 MED ORDER — LEVALBUTEROL HCL 1.25 MG/0.5ML IN NEBU
1.2500 mg | INHALATION_SOLUTION | Freq: Four times a day (QID) | RESPIRATORY_TRACT | Status: DC | PRN
  Administered 2011-10-26: 1.25 mg via RESPIRATORY_TRACT
  Filled 2011-10-26 (×2): qty 0.5

## 2011-10-26 MED ORDER — DSS 100 MG PO CAPS: 100.0000 mg | ORAL_CAPSULE | Freq: Two times a day (BID) | ORAL | Status: DC

## 2011-10-26 MED ORDER — OXYCODONE HCL 5 MG PO TABS: 5.0000 mg | ORAL_TABLET | Freq: Four times a day (QID) | ORAL | Status: DC | PRN

## 2011-10-26 MED ORDER — ASPIRIN EC 325 MG PO TBEC: 325.0000 mg | DELAYED_RELEASE_TABLET | Freq: Every day | ORAL | Status: DC

## 2011-10-26 MED ORDER — OXYCODONE-ACETAMINOPHEN 5-325 MG PO TABS: 1.0000 | ORAL_TABLET | Freq: Four times a day (QID) | ORAL | Status: DC | PRN

## 2011-10-26 MED ORDER — METHOCARBAMOL 500 MG PO TABS: 500.0000 mg | ORAL_TABLET | Freq: Four times a day (QID) | ORAL | Status: DC | PRN

## 2011-10-26 NOTE — Progress Notes (Signed)
Orthopaedic Trauma Service (OTS)  Subjective: 2 Days Post-Op Procedure(s) (LRB): INTRAMEDULLARY (IM) NAIL TIBIAL (Left) HARDWARE REMOVAL (Left)  Patient is doing okay No specific complaints Social work Psychologist, educational for nursing facility  Objective: Current Vitals Blood pressure 130/68, pulse 107, temperature 99.2 F (37.3 C), temperature source Axillary, resp. rate 18, last menstrual period 09/21/2011, SpO2 92.00%. Vital signs in last 24 hours: Temp:  [98.4 F (36.9 C)-99.2 F (37.3 C)] 99.2 F (37.3 C) (09/26 0500) Pulse Rate:  [107-127] 107  (09/26 0500) Resp:  [12-29] 18  (09/26 0500) BP: (112-130)/(58-78) 130/68 mmHg (09/26 0500) SpO2:  [89 %-96 %] 92 % (09/26 0500)  Intake/Output from previous day: 09/25 0701 - 09/26 0700 In: 240 [P.O.:240] Out: -  Intake/Output      09/25 0701 - 09/26 0700 09/26 0701 - 09/27 0700   P.O. 240    I.V.     Total Intake 240    Blood     Total Output     Net +240         Urine Occurrence 5 x       LABS  Basename 10/26/11 0600  HGB 10.9*    Basename 10/26/11 0600  WBC 9.3  RBC 3.90  HCT 33.5*  PLT 196    Basename 10/26/11 0600  NA 138  K 3.9  CL 100  CO2 28  BUN 6  CREATININE 0.59  GLUCOSE 118*  CALCIUM 9.1   No results found for this basename: LABPT:2,INR:2 in the last 72 hours   Physical Exam  Gen: The patient is a little somnolent but easily arousable Lungs: Clear Cardiac: Regular Abd: Nontender Ext:     Left lower extremity   Wounds look fantastic  Swelling is controlled  Distal motor and sensory functions are intact  Extremity is warm and  Palpable dorsalis pedis pulse  Good ankle and knee motion    Assessment/Plan: 2 Days Post-Op Procedure(s) (LRB): INTRAMEDULLARY (IM) NAIL TIBIAL (Left) HARDWARE REMOVAL (Left)  26 y/o female with L tibial shaft nonunion   1. L tibial shaft nonunion s/p exchange nail   TDWB L leg to facilitate transfers   Knee and ankle ROM as tolerated   Dressing  changed  Ice and elevate  2. R AKA   4 months post aka   No issues with AKA, stable  3. Depression   Continue with meds  4. UTI   Present pre-admission   On abx   Finish course  5. FEN   Diet as tolerated  6. Pain   DC PCA   Oral pain medicine  Maintain continuous pulse ox 7. Activity   Up as tolerated, bed to chair   TDWB L leg to help with transfers  8. DVT/PE prophylaxis  Lovenox while inpatient  Discharge with aspirin for 4 weeks, 325 mg by mouth daily 9. Dispo   Patient is stable for discharge to nursing facility once bed is available.  Mearl Latin, PA-C [facility] [phone] (P) 10/26/2011, 8:38 AM

## 2011-10-26 NOTE — Progress Notes (Signed)
Patient feeling well today with decreased pain; no numbness or tingling Low grade temp, slight tachycardia; sat in low 90s  LLE drsg clean, dry, intact  Intact DPN,SPN,TN sens and motor  PLAN: SW working on SNF given patient's mental health, AKA on contralateral side, and residence in a homeless shelter pre-op. F/u office in 10 days post d/c  Myrene Galas, MD [facility], [facility] [phone] (p)

## 2011-10-26 NOTE — Progress Notes (Signed)
Patient's O2 status was between the 80's-90's on room air with the afternoon, routine vital sign check. 2L [place] was administered and the patient came up to 92%. She stated that she normally gets respiratory treatments in the hospital, and wanted the doctor to be paged. Montez Morita PA-C was notified and orders given for prn Xopenex. Will continue to monitor.

## 2011-10-26 NOTE — Progress Notes (Signed)
I have seen and examined the patient. I agree with the findings above.  Budd Palmer, MD 10/26/2011 8:38 AM

## 2011-10-26 NOTE — Progress Notes (Signed)
RT called to pts room due to low SpO2.  Pt lying in bed, sleeping upon arrival.  RT helped pt sit up in bed and awakened.  SpO2 reading 96-97% on 2 lpm O2 once aroused.  I will make RN aware.  BIL bs diminished.

## 2011-10-26 NOTE — Evaluation (Signed)
Occupational Therapy Evaluation Patient Details Name: Tonya [name] MRN: [id number] DOB: 01-01-1986 Today's Date: 10/26/2011 Time: 6644-0347 OT Time Calculation (min): 24 min  OT Assessment / Plan / Recommendation Clinical Impression  26 yo readmission s/p attempted suicide in May with traumatic Rt LE amputation. Readmission for  Lt tibial revision IM Nail that does not require skilled OT acutely. Ot to sign off    OT Assessment  Patient does not need any further OT services    Follow Up Recommendations  No OT follow up    Barriers to Discharge      Equipment Recommendations  None recommended by OT (W/c elevated Lt LE and Rt side pad for amputation)    Recommendations for Other Services    Frequency       Precautions / Restrictions Precautions Precautions: Fall Restrictions LLE Weight Bearing: Touchdown weight bearing   Pertinent Vitals/Pain No pain reported Pt reports comfortable in chair     ADL  Grooming: Performed;Wash/dry hands;Wash/dry face;Denture care;Independent (sitting in chair) Where Assessed - Grooming: Unsupported sitting Upper Body Bathing: Performed;Chest;Right arm;Left arm;Abdomen;Independent Where Assessed - Upper Body Bathing: Unsupported sitting Lower Body Bathing: Performed;Independent Where Assessed - Lower Body Bathing: Lean right and/or left Upper Body Dressing: Performed;Independent Where Assessed - Upper Body Dressing: Unsupported sitting Toilet Transfer: Performed;Independent Toilet Transfer Method: Clinical biochemist: Raised toilet seat with arms (or 3-in-1 over toilet) Toileting - Clothing Manipulation and Hygiene: Performed;Independent Where Assessed - Toileting Clothing Manipulation and Hygiene: Lean right and/or left Transfers/Ambulation Related to ADLs: PT completed anterior / posterior transfer from bed <>3n1, 3N1<>bed, bed<>recliner with only support to chair required and min v/c. Pt completed transfer at  adequate level for d/c . Pt does not required skilled OT acutely ADL Comments: Pt completed full ADL in chair with items setup. Pt demonstrates ability to complete task at sink level in w/c. Pt just prefered the chair this session at [facility]. Pt does not require skilled OT and demonstrates excellent carry over from CIR    OT Diagnosis:    OT Problem List:   OT Treatment Interventions:     OT Goals    Visit Information  Last OT Received On: 10/26/11 Assistance Needed: +1    Subjective Data  Subjective: "who will I see for speech?"- pt wanting to visit with therapist from CIR  Patient Stated Goal: to go to CIR to visit while at [facility]   Prior Functioning     Home Living Lives With: Alone Available Help at Discharge: Skilled Nursing Facility Type of Home: Homeless Home Adaptive Equipment: Wheelchair - manual Additional Comments: Pt from homeless shelter and verbalized unlikely d/c to mother house in [place] upon this evaluation Prior Function Level of Independence: Independent with assistive device(s) Able to Take Stairs?: No Driving: No Vocation: Unemployed Comments: PT w/c has LT LE LE and Rt side without pad for ambulation in room currently. Spoke with PT Theron Arista) to address the w/c during session.  NO acute OT needs at this time Communication Communication: No difficulties Dominant Hand: Right         Vision/Perception     Cognition  Overall Cognitive Status: Appears within functional limits for tasks assessed/performed Arousal/Alertness: Awake/alert Orientation Level: Oriented X4 / Intact Behavior During Session: Flat affect Cognition - Other Comments: Pt reports feeling fatigued and flat affect. Pt did smile and asked if it would be possible for staff to take patient to CIR prior to d/c to visit    Extremity/Trunk Assessment Right Upper  Extremity Assessment RUE ROM/Strength/Tone: Within functional levels RUE Sensation: WFL - Light Touch RUE Coordination: WFL - gross  motor Left Upper Extremity Assessment LUE ROM/Strength/Tone: Within functional levels LUE Sensation: WFL - Light Touch LUE Coordination: WFL - gross motor Trunk Assessment Trunk Assessment: Normal     Mobility Bed Mobility Supine to Sit: 7: Independent;HOB flat Sit to Supine: 7: Independent;HOB flat Transfers Details for Transfer Assistance: Pt completed transfer without (A) required. OT holding chair for safety but in a locked w/c pt could demonstrate without (A)     Shoulder Instructions     Exercise     Balance     End of Session OT - End of Session Activity Tolerance: Patient tolerated treatment well Patient left: in chair;with call bell/phone within reach;Other (comment) (PT Peter entering room) Nurse Communication: Mobility status;Precautions  GO     Lucile Shutters 10/26/2011, 10:12 AM Pager: [phone]

## 2011-10-26 NOTE — Progress Notes (Signed)
Occupational Therapy Discharge Patient Details Name: Tonya [name] MRN: [id number] DOB: [birth year] Today's Date: 10/26/2011 Time: 9147-8295 OT Time Calculation (min): 24 min  Patient discharged from OT services secondary to goals met and no further OT needs identified.  Please see latest therapy progress note for current level of functioning and progress toward goals.    Progress and discharge plan discussed with patient and/or caregiver: Patient/Caregiver agrees with plan     Lucile Shutters Pager: [phone]  10/26/2011, 10:13 AM

## 2011-10-26 NOTE — Discharge Summary (Addendum)
[facility]edic [facility] (OTS)  Patient ID: Tonya [name] MRN: [id number] DOB/AGE: Nov 17, 1985 26 y.o.  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 She sustained multiple injuries including a traumatic amputation to her right leg which resulted in a right AKA. She also sustained a closed segmental left tibia fracture which was treated with an external fixator followed by intramedullary nailing several weeks later. Patient did go on to unite the proximal segment however her distal segment develop a nonunion with bending of hardware. We discussed the treatment options and decided that it would be best to proceed with surgical intervention sooner rather than later as a hardware still largely intact. The patient underwent the procedure described up above 10/24/2011. She tolerated the procedure very well. After surgery she was taken to the PACU for recovery from anesthesia and was transferred up to the orthopedic floor for continued observation, pain control and to begin therapies. Patient's clinical picture is complicated due to her mental illness as well as right AKA. In addition patient also has a very tenuous social support system. Mom lives in [place], patient was living with her fianc here in town however they have subsequently broken up and she's been living in a homeless shelter for quite some time now. Patient has done  very well postoperatively without any significant issues. On postoperative day #1 she was doing fair pain still requiring IV narcotics for adequate control. She did do some work with physical therapy albeit somewhat minimal she is somewhat restricted given her right AKA and touchdown weightbearing on her left leg. On postoperative day #2 patient was deemed much better. And dressing was changed wounds look excellent. Clinically she was doing much better. She was deemed medically stable for transfer to skilled nursing facility once a bed was available. We did have the clinical social worker engaged with this patient on postoperative day #1 insert and a nursing home for the patient. At the time of this dictation patient is awaiting a bed offer. She is stable for discharge to skilled nursing facility once bed is available.  Consults: None  Significant Diagnostic Studies: None  Treatments: IV hydration, antibiotics: Ancef, analgesia: Morphine PCA, OxyIR and Percocet, anticoagulation: LMW heparin, therapies: PT, OT, RN and SW and surgery: As above  Discharge Exam:  Subjective:  2 Days Post-Op Procedure(s) (LRB):  INTRAMEDULLARY (IM) NAIL TIBIAL (Left)  HARDWARE REMOVAL (Left)  Patient is doing okay  No specific complaints  Social work Psychologist, educational for nursing facility  Objective:  Current Vitals  Blood pressure 130/68, pulse 107, temperature 99.2 F (37.3 C), temperature source Axillary, resp. rate 18, last menstrual period 09/21/2011, SpO2 92.00%.  Vital signs in last 24 hours:  Temp: [98.4 F (36.9 C)-99.2 F (37.3 C)] 99.2 F (37.3 C) (09/26 0500)  Pulse Rate: [107-127] 107 (09/26 0500)  Resp: [12-29] 18 (09/26 0500)  BP: (112-130)/(58-78) 130/68 mmHg (09/26 0500)  SpO2: [89 %-96 %] 92 % (09/26 0500)  Intake/Output from previous day:  09/25 0701 - 09/26 0700  In: 240 [P.O.:240]  Out: -  Intake/Output  09/25 0701 - 09/26 0700 09/26 0701 - 09/27 0700  P.O. 240  I.V.  Total Intake 240    Blood  Total Output  Net +240  Urine Occurrence 5 x   LABS   Basename  10/26/11 0600   HGB  10.9*     Basename  10/26/11 0600   WBC  9.3   RBC  3.90   HCT  33.5*   PLT  196     Basename  10/26/11 0600   NA  138   K  3.9   CL  100   CO2  28   BUN  6   CREATININE  0.59   GLUCOSE  118*   CALCIUM  9.1    No results found for this basename: LABPT:2,INR:2 in the last 72 hours  Physical Exam  Gen: The patient is a little somnolent but easily arousable  Lungs: Clear  Cardiac: Regular  Abd: Nontender  Ext:  Left lower extremity  Wounds look fantastic  Swelling is controlled  Distal motor and sensory functions are intact  Extremity is warm and  Palpable dorsalis pedis pulse  Good ankle and knee motion  Assessment/Plan:  2 Days Post-Op Procedure(s) (LRB):  INTRAMEDULLARY (IM) NAIL TIBIAL (Left)  HARDWARE REMOVAL (Left)  26 y/o female with L tibial shaft nonunion  1. L tibial shaft nonunion s/p exchange nail  TDWB L leg to facilitate transfers  Knee and ankle ROM as tolerated  Dressing changed  Ice and elevate  2. R AKA  4 months post aka  No issues with AKA, stable  3. Depression  Continue with meds  4. UTI  Present pre-admission  On abx  Finish course  5. FEN  Diet as tolerated  6. Pain  DC PCA  Oral pain medicine  Maintain continuous pulse ox  7. Activity  Up as tolerated, bed to chair  TDWB L leg to help with transfers  8. DVT/PE prophylaxis  Lovenox while inpatient  Discharge with aspirin for 4 weeks, 325 mg by mouth daily  9. Dispo  Patient is stable for discharge to nursing facility once bed is available.   Disposition: SNF      Discharge Orders    Future Orders Please Complete By Expires   Diet general      Call MD / Call 911      Comments:   If you experience chest pain or shortness of breath, CALL 911 and be transported to the hospital emergency room.  If you develope a fever above 101 F, pus (white drainage) or increased drainage  or redness at the wound, or calf pain, call your surgeon's office.   Constipation Prevention      Comments:   Drink plenty of fluids.  Prune juice may be helpful.  You may use a stool softener, such as Colace (over the counter) 100 mg twice a day.  Use MiraLax (over the counter) for constipation as needed.   Increase activity slowly as tolerated      Discharge instructions      Comments:   Orthopaedic Trauma Service Discharge Instructions,   General Discharge Instructions  WEIGHT BEARING STATUS: Touchdown weightbearing left leg to facilitate transfers  RANGE OF MOTION/ACTIVITY: Range of motion as tolerated left ankle, knee and hip. Activity as tolerated within weight bearing restrictions  Diet: as you were eating previously.  Can use over the counter stool softeners  and bowel preparations, such as Miralax, to help with bowel movements.  Narcotics can be constipating.  Be sure to drink plenty of fluids  STOP SMOKING OR USING NICOTINE PRODUCTS!!!!  As discussed nicotine severely impairs your body's ability to heal surgical and traumatic wounds but also impairs bone healing.  Wounds and bone heal by forming microscopic blood vessels (angiogenesis) and nicotine is a vasoconstrictor (essentially, shrinks blood vessels).  Therefore, if vasoconstriction occurs to these microscopic blood vessels they essentially disappear and are unable to deliver necessary nutrients to the healing tissue.  This is one modifiable factor that you can do to dramatically increase your chances of healing your injury.    (This means no smoking, no nicotine gum, patches, etc)  DO NOT USE NONSTEROIDAL ANTI-INFLAMMATORY DRUGS (NSAID'S)  Using products such as Advil (ibuprofen), Aleve (naproxen), Motrin (ibuprofen) for additional pain control during fracture healing can delay and/or prevent the healing response.  If you would like to take over the counter (OTC) medication, Tylenol (acetaminophen) is ok.  However, some narcotic  medications that are given for pain control contain acetaminophen as well. Therefore, you should not exceed more than 4000 mg of tylenol in a day if you do not have liver disease.  Also note that there are may OTC medicines, such as cold medicines and allergy medicines that my contain tylenol as well.  If you have any questions about medications and/or interactions please ask your doctor/PA or your pharmacist.   PAIN MEDICATION USE AND EXPECTATIONS  You have likely been given narcotic medications to help control your pain.  After a traumatic event that results in an fracture (broken bone) with or without surgery, it is ok to use narcotic pain medications to help control one's pain.  We understand that everyone responds to pain differently and each individual patient will be evaluated on a regular basis for the continued need for narcotic medications. Ideally, narcotic medication use should last no more than 6-8 weeks (coinciding with fracture healing).   As a patient it is your responsibility as well to monitor narcotic medication use and report the amount and frequency you use these medications when you come to your office visit.   We would also advise that if you are using narcotic medications, you should take a dose prior to therapy to maximize you participation.  IF YOU ARE ON NARCOTIC MEDICATIONS IT IS NOT PERMISSIBLE TO OPERATE A MOTOR VEHICLE (MOTORCYCLE/CAR/TRUCK/MOPED) OR HEAVY MACHINERY DO NOT MIX NARCOTICS WITH OTHER CNS (CENTRAL NERVOUS SYSTEM) DEPRESSANTS SUCH AS ALCOHOL       ICE AND ELEVATE INJURED/OPERATIVE EXTREMITY  Using ice and elevating the injured extremity above your heart can help with swelling and pain control.  Icing in a pulsatile fashion, such as 20 minutes on and 20 minutes off, can be followed.    Do not place ice directly on skin. Make sure there is a barrier between to skin and the ice pack.    Using frozen items such as frozen peas works well as the conform nicely to  the are that needs to be iced.  USE AN ACE WRAP OR TED HOSE FOR SWELLING CONTROL  In addition to icing and elevation, Ace wraps or TED hose are used to help limit and resolve swelling.  It is recommended to use Ace wraps or TED hose until you are informed to stop.    When using Ace Wraps start the wrapping distally (farthest away from the body) and wrap proximally (closer to the body)  Example: If you had surgery on your leg or thing and you do not have a splint on, start the ace wrap at the toes and work your way up to the thigh        If you had surgery on your upper extremity and do not have a splint on, start the ace wrap at your fingers and work your way up to the upper arm  IF YOU ARE IN A SPLINT OR CAST DO NOT REMOVE IT FOR ANY REASON   If your splint gets wet for any reason please contact the office immediately. You may shower in your splint or cast as long as you keep it dry.  This can be done by wrapping in a cast cover or garbage back (or similar)  Do Not stick any thing down your splint or cast such as pencils, money, or hangers to try and scratch yourself with.  If you feel itchy take benadryl as prescribed on the bottle for itching  IF YOU ARE IN A CAM BOOT (BLACK BOOT)  You may remove boot periodically. Perform daily dressing changes as noted below.  Wash the liner of the boot regularly and wear a sock when wearing the boot. It is recommended that you sleep in the boot until told otherwise  CALL THE OFFICE WITH ANY QUESTIONS OR CONCERTS: [phone]      Discharge Wound Care Instructions  Do NOT apply any ointments, solutions or lotions to pin sites or surgical wounds.  These prevent needed drainage and even though solutions like hydrogen peroxide kill bacteria, they also damage cells lining the pin sites that help fight infection.  Applying lotions or ointments can keep the wounds moist and can cause them to breakdown and open up as well. This can increase the risk for  infection. When in doubt call the office.  Surgical incisions should be dressed daily.  If any drainage is noted, use one layer of adaptic, then gauze, Kerlix, and an ace wrap.  Once the incision is completely dry and without drainage, it may be left open to air out.  Showering may begin 36-48 hours later.  Cleaning gently with soap and water.  Traumatic wounds should be dressed daily as well.    One layer of adaptic, gauze, Kerlix, then ace wrap.  The adaptic can be discontinued once the draining has ceased    If you have a wet to dry dressing: wet the gauze with saline the squeeze as much saline out so the gauze is moist (not soaking wet), place moistened gauze over wound, then place a dry gauze over the moist one, followed by Kerlix wrap, then ace wrap.   Touch down weight bearing      Comments:   Left leg   TED hose      Comments:   Use stockings (TED hose) to help with swelling control and prevent against clots       Medication List     As of 10/30/2011  4:01 PM    STOP taking these medications         HYDROcodone-acetaminophen 5-325 MG per tablet   Commonly known as: NORCO/VICODIN      TAKE these medications         aspirin EC 325 MG tablet   Take 1 tablet (325 mg total) by mouth daily.      DSS 100 MG Caps   Take 100 mg by mouth 2 (two) times daily.      DULoxetine 60  MG capsule   Commonly known as: CYMBALTA   Take 60 mg by mouth daily.      gabapentin 300 MG capsule   Commonly known as: NEURONTIN   Take 300 mg by mouth 3 (three) times daily.      LORazepam 0.5 MG tablet   Commonly known as: ATIVAN   Take 0.5 mg by mouth every 6 (six) hours as needed. For restlessness      methocarbamol 500 MG tablet   Commonly known as: ROBAXIN   Take 1-2 tablets (500-1,000 mg total) by mouth every 6 (six) hours as needed (Spasms).      nitrofurantoin 100 MG capsule   Commonly known as: MACRODANTIN   Take 100 mg by mouth 2 (two) times daily.      OLANZapine 15 MG  tablet   Commonly known as: ZYPREXA   Take 15 mg by mouth at bedtime.      oxyCODONE 5 MG immediate release tablet   Commonly known as: Oxy IR/ROXICODONE   Take 1-3 tablets (5-15 mg total) by mouth every 6 (six) hours as needed for pain (Breakthrough pain).      oxyCODONE-acetaminophen 5-325 MG per tablet   Commonly known as: PERCOCET/ROXICET   Take 1-2 tablets by mouth every 6 (six) hours as needed for pain.      SEROQUEL XR 50 MG Tb24   Generic drug: QUEtiapine   Take 100 mg by mouth at bedtime.      sodium chloride 0.65 % nasal spray   Commonly known as: OCEAN   Place 1 spray into the nose as needed. For nasal congestion        Follow-up Information    Follow up with Seda Kronberg H, MD. Schedule an appointment as soon as possible for a visit in 14 days.   Contact information:   [address] [place] [address] [phone]          Discharge Instructions and Plan:  The patient's initial injury was severe with a segmental tibia fracture. Fortunately the proximal segment did unite. Patient did develop a nonunion of her distal segment with loss of reduction and bent hardware. We were able to remove the hardware without difficulty and place a new intramedullary nail to maintain alignment. We are hopeful that the the graft from the intramedullary canal from the reamings as well as using a larger now with new hardware will allow this fracture to go on and unite. She does have a fairly sizable defect to that distal segment and may need subsequent grafting procedure later on. The patient will be touchdown weightbearing on the left leg to facilitate transfers. Her condition is complicated by a right AKA. Patient will essentially be bed to chair for the next 6-8 weeks after which time we'll begin weightbearing on the left side and hopefully she will be fitted for prosthesis to allow for weightbearing bilaterally. Her right AKA is completely stable and we do not have  any issues pertaining to this. Patient's psych history also complicates the clinical picture. She will need a skilled nursing facility until she is weightbearing as she is currently homeless. She can discharge from the nursing facility if she returns home with her mother who was in [place]. Patient has unrestricted range of motion of her left ankle and knee. She is encouraged to participate with physical therapy on a regular basis. Patient will be on Percocet and oxycodone for pain control as well as Robaxin for spasm. To resume her home  medications as noted above. We'll check her back in the office in 10-14 days for reevaluation, followup x-rays and removal of her stitches. She should perform a regular dressing changes on a daily basis as indicated by discharge instructions. No ointments, lotions, solution should be applied to as these may cause wound he has since. Dry dressings should be applied and TED hose can be used from now on to help with swelling control. Should the facility have any questions regarding her care they are to contact the office at [phone]  Signed:  Mearl Latin, PA-C [facility] [phone] (P) 10/30/2011, 4:01 PM

## 2011-10-27 LAB — TISSUE CULTURE: Culture: NO GROWTH

## 2011-10-27 NOTE — Progress Notes (Signed)
POD 3 Doing well this am, pain controlled; no BM AFVSS PE: no change in exam, drsg clean  Plan: "Evaluator" expected today which should help to finalize options for placement  Will notify prosthetics of patient's presence in hospital in case she has been difficult to follow up with logistically and so that she might progress with therapy  Advance bowel regimen  Myrene Galas, MD [facility] [phone] (p)

## 2011-10-27 NOTE — Progress Notes (Signed)
PT Cancellation Note  Treatment cancelled today due to pt has met the PT transfer goals.  MD reports pt to receive prosthetic LE.  Will follow-up with pt for mobility with  prosthetic LE when prosthesis arrives. Marland Kitchen  Karyna Bessler 10/27/2011, 5:25 PM Lynnix Schoneman L. Zulay Corrie DPT [phone]

## 2011-10-27 NOTE — Progress Notes (Signed)
Clinical Social Work  Patient referred for Level 2 [facility]. Evaluator called CSW and stated she would review patient today. No [facility] number posted yet. CSW met with patient and gave bed offers. At this time, only bed offer is [facility]. CSW explained if patient did not want this SNF then she would have to expand search to other counties. Patient not agreeable to expanding search at this time and will ask family to tour facility before making a final decision. CSW will continue to follow.  [place], [place] [phone] (Coverage for Lovette Cliche)

## 2011-10-28 NOTE — Progress Notes (Signed)
Seen in room 5N09  Vital signs stable, afebrile  S: awaiting SNF  O: appears comfortable in bed  A: stable  P: SNF

## 2011-10-28 NOTE — Progress Notes (Signed)
Saw and assessed patient during morning rounds. Voiced no discomfort. V/S within acceptable limits. Will monitor.

## 2011-10-29 LAB — ANAEROBIC CULTURE

## 2011-10-29 MED ORDER — MAGNESIUM CITRATE PO SOLN
1.0000 | Freq: Once | ORAL | Status: DC
  Filled 2011-10-29: qty 296

## 2011-10-29 NOTE — Plan of Care (Signed)
Problem: Phase III Progression Outcomes Goal: Discharge plan remains appropriate-arrangements made Outcome: Progressing awaing [facility] number and bed acceptance

## 2011-10-29 NOTE — Progress Notes (Signed)
Seen in room 5N09  Afebrile vital signs stable  Wants permission to leave floor in a wheel chair  Permission granted.

## 2011-10-30 NOTE — Progress Notes (Signed)
PT Cancellation and Discharge  Note  Treatment cancelled today due to patient's refusal to participate and Pt still awaiting prosthetic to be delivered. Pt to D/C to SNF for rehab today.  Acute PT signing off.   Assunta Pupo 10/30/2011, 3:57 PM Michalina Calbert L. Arshawn Valdez DPT [phone]

## 2012-01-10 ENCOUNTER — Ambulatory Visit: Payer: Medicare Other | Attending: Internal Medicine | Admitting: Physical Therapy

## 2012-01-10 DIAGNOSIS — R269 Unspecified abnormalities of gait and mobility: Secondary | ICD-10-CM | POA: Insufficient documentation

## 2012-01-10 DIAGNOSIS — R5381 Other malaise: Secondary | ICD-10-CM | POA: Insufficient documentation

## 2012-01-10 DIAGNOSIS — M6281 Muscle weakness (generalized): Secondary | ICD-10-CM | POA: Insufficient documentation

## 2012-01-10 DIAGNOSIS — S88119A Complete traumatic amputation at level between knee and ankle, unspecified lower leg, initial encounter: Secondary | ICD-10-CM | POA: Insufficient documentation

## 2012-01-10 DIAGNOSIS — IMO0001 Reserved for inherently not codable concepts without codable children: Secondary | ICD-10-CM | POA: Insufficient documentation

## 2012-01-16 ENCOUNTER — Ambulatory Visit: Payer: Medicare Other | Admitting: Physical Therapy

## 2012-01-18 ENCOUNTER — Ambulatory Visit: Payer: Medicare Other | Admitting: Physical Therapy

## 2012-01-22 ENCOUNTER — Ambulatory Visit: Payer: Medicare Other | Admitting: Physical Therapy

## 2012-01-25 ENCOUNTER — Ambulatory Visit: Payer: Medicare Other | Admitting: Physical Therapy

## 2012-02-01 ENCOUNTER — Ambulatory Visit: Payer: Medicare Other | Attending: Internal Medicine | Admitting: Physical Therapy

## 2012-02-01 DIAGNOSIS — IMO0001 Reserved for inherently not codable concepts without codable children: Secondary | ICD-10-CM | POA: Insufficient documentation

## 2012-02-01 DIAGNOSIS — R5381 Other malaise: Secondary | ICD-10-CM | POA: Insufficient documentation

## 2012-02-01 DIAGNOSIS — S88119A Complete traumatic amputation at level between knee and ankle, unspecified lower leg, initial encounter: Secondary | ICD-10-CM | POA: Insufficient documentation

## 2012-02-01 DIAGNOSIS — M6281 Muscle weakness (generalized): Secondary | ICD-10-CM | POA: Insufficient documentation

## 2012-02-01 DIAGNOSIS — R269 Unspecified abnormalities of gait and mobility: Secondary | ICD-10-CM | POA: Insufficient documentation

## 2012-02-05 ENCOUNTER — Ambulatory Visit: Payer: Medicare Other | Admitting: Physical Therapy

## 2012-02-07 ENCOUNTER — Ambulatory Visit: Payer: Medicare Other | Admitting: Physical Therapy

## 2012-02-12 ENCOUNTER — Ambulatory Visit: Payer: Medicare Other | Admitting: Physical Therapy

## 2012-02-14 ENCOUNTER — Ambulatory Visit: Payer: Medicare Other | Admitting: Physical Therapy

## 2012-02-19 ENCOUNTER — Ambulatory Visit: Payer: Medicare Other | Admitting: Physical Therapy

## 2012-02-21 ENCOUNTER — Encounter: Payer: Medicare Other | Admitting: Physical Therapy

## 2012-02-22 ENCOUNTER — Ambulatory Visit: Payer: Medicare Other | Admitting: Physical Therapy

## 2012-02-23 ENCOUNTER — Ambulatory Visit: Payer: Medicare Other | Admitting: Physical Therapy

## 2012-02-26 ENCOUNTER — Ambulatory Visit: Payer: Medicare Other | Admitting: Physical Therapy

## 2012-02-29 ENCOUNTER — Ambulatory Visit: Payer: Medicare Other | Admitting: Physical Therapy

## 2012-03-05 ENCOUNTER — Ambulatory Visit: Payer: Medicare Other | Attending: Internal Medicine | Admitting: Physical Therapy

## 2012-03-05 DIAGNOSIS — S88119A Complete traumatic amputation at level between knee and ankle, unspecified lower leg, initial encounter: Secondary | ICD-10-CM | POA: Insufficient documentation

## 2012-03-05 DIAGNOSIS — IMO0001 Reserved for inherently not codable concepts without codable children: Secondary | ICD-10-CM | POA: Insufficient documentation

## 2012-03-05 DIAGNOSIS — M6281 Muscle weakness (generalized): Secondary | ICD-10-CM | POA: Insufficient documentation

## 2012-03-05 DIAGNOSIS — R269 Unspecified abnormalities of gait and mobility: Secondary | ICD-10-CM | POA: Insufficient documentation

## 2012-03-05 DIAGNOSIS — R5381 Other malaise: Secondary | ICD-10-CM | POA: Insufficient documentation

## 2012-03-07 ENCOUNTER — Ambulatory Visit: Payer: Medicare Other | Admitting: Physical Therapy

## 2012-03-11 ENCOUNTER — Other Ambulatory Visit: Payer: Self-pay | Admitting: Orthopedic Surgery

## 2012-03-11 DIAGNOSIS — IMO0002 Reserved for concepts with insufficient information to code with codable children: Secondary | ICD-10-CM

## 2012-03-12 ENCOUNTER — Ambulatory Visit: Payer: Medicare Other | Admitting: Physical Therapy

## 2012-03-13 ENCOUNTER — Other Ambulatory Visit: Payer: Medicare Other

## 2012-03-14 ENCOUNTER — Ambulatory Visit: Payer: Medicare Other | Admitting: Physical Therapy

## 2012-03-18 ENCOUNTER — Ambulatory Visit: Payer: Medicare Other | Admitting: Physical Therapy

## 2012-03-19 ENCOUNTER — Encounter: Payer: Medicare Other | Admitting: Physical Therapy

## 2012-03-20 ENCOUNTER — Ambulatory Visit
Admission: RE | Admit: 2012-03-20 | Discharge: 2012-03-20 | Disposition: A | Discharge status: Home / Self Care | Payer: Medicare Other | Source: Ambulatory Visit | Attending: Orthopedic Surgery | Admitting: Orthopedic Surgery

## 2012-03-20 DIAGNOSIS — IMO0002 Reserved for concepts with insufficient information to code with codable children: Secondary | ICD-10-CM

## 2012-03-21 ENCOUNTER — Ambulatory Visit: Payer: Medicare Other | Admitting: Physical Therapy

## 2012-03-26 ENCOUNTER — Ambulatory Visit: Payer: Medicare Other | Admitting: Physical Therapy

## 2012-03-28 ENCOUNTER — Ambulatory Visit: Payer: Medicare Other | Admitting: Physical Therapy

## 2012-03-28 ENCOUNTER — Encounter (HOSPITAL_COMMUNITY): Payer: Self-pay | Admitting: Respiratory Therapy

## 2012-04-01 ENCOUNTER — Ambulatory Visit: Payer: Medicare Other | Attending: Internal Medicine | Admitting: Physical Therapy

## 2012-04-01 DIAGNOSIS — IMO0001 Reserved for inherently not codable concepts without codable children: Secondary | ICD-10-CM | POA: Insufficient documentation

## 2012-04-01 DIAGNOSIS — S88119A Complete traumatic amputation at level between knee and ankle, unspecified lower leg, initial encounter: Secondary | ICD-10-CM | POA: Insufficient documentation

## 2012-04-01 DIAGNOSIS — M6281 Muscle weakness (generalized): Secondary | ICD-10-CM | POA: Insufficient documentation

## 2012-04-01 DIAGNOSIS — R5381 Other malaise: Secondary | ICD-10-CM | POA: Insufficient documentation

## 2012-04-01 DIAGNOSIS — R269 Unspecified abnormalities of gait and mobility: Secondary | ICD-10-CM | POA: Insufficient documentation

## 2012-04-03 ENCOUNTER — Ambulatory Visit: Payer: Medicare Other | Admitting: Physical Therapy

## 2012-04-03 ENCOUNTER — Encounter (HOSPITAL_COMMUNITY)
Admission: RE | Admit: 2012-04-03 | Discharge: 2012-04-03 | Disposition: A | Discharge status: Home / Self Care | Payer: Medicare Other | Source: Ambulatory Visit | Attending: Orthopedic Surgery | Admitting: Orthopedic Surgery

## 2012-04-03 ENCOUNTER — Encounter (HOSPITAL_COMMUNITY): Payer: Self-pay

## 2012-04-03 HISTORY — DX: Reserved for inherently not codable concepts without codable children: IMO0001

## 2012-04-03 LAB — URINE MICROSCOPIC-ADD ON

## 2012-04-03 LAB — CBC WITH DIFFERENTIAL/PLATELET
Basophils Relative: 0 % (ref 0–1)
Eosinophils Absolute: 0.2 10*3/uL (ref 0.0–0.7)
Eosinophils Relative: 3 % (ref 0–5)
HCT: 40.5 % (ref 36.0–46.0)
Hemoglobin: 14.4 g/dL (ref 12.0–15.0)
Lymphs Abs: 3.8 10*3/uL (ref 0.7–4.0)
MCH: 29.7 pg (ref 26.0–34.0)
MCHC: 35.6 g/dL (ref 30.0–36.0)
MCV: 83.5 fL (ref 78.0–100.0)
Monocytes Absolute: 0.5 10*3/uL (ref 0.1–1.0)
Monocytes Relative: 7 % (ref 3–12)
Neutrophils Relative %: 41 % — ABNORMAL LOW (ref 43–77)
RBC: 4.85 MIL/uL (ref 3.87–5.11)

## 2012-04-03 LAB — URINALYSIS, ROUTINE W REFLEX MICROSCOPIC
Bilirubin Urine: NEGATIVE
Glucose, UA: NEGATIVE mg/dL
Nitrite: NEGATIVE
Specific Gravity, Urine: 1.017 (ref 1.005–1.030)
pH: 8 (ref 5.0–8.0)

## 2012-04-03 LAB — BASIC METABOLIC PANEL
BUN: 6 mg/dL (ref 6–23)
CO2: 29 mEq/L (ref 19–32)
Chloride: 99 mEq/L (ref 96–112)
Glucose, Bld: 89 mg/dL (ref 70–99)
Potassium: 3.7 mEq/L (ref 3.5–5.1)
Sodium: 140 mEq/L (ref 135–145)

## 2012-04-03 LAB — PROTIME-INR: INR: 0.95 (ref 0.00–1.49)

## 2012-04-03 MED ORDER — CEFAZOLIN SODIUM-DEXTROSE 2-3 GM-% IV SOLR: 2.0000 g | INTRAVENOUS | Status: DC

## 2012-04-03 MED ORDER — DEXTROSE 5 % IV SOLN
3.0000 g | INTRAVENOUS | Status: AC
  Administered 2012-04-04: 3 g via INTRAVENOUS
  Filled 2012-04-03: qty 3000

## 2012-04-03 NOTE — Progress Notes (Signed)
PATIENT NOTIFIED OF TIME CHANGE, INSTRUCTED TO ARRIVE AT 830 AM ON 04/04/12.

## 2012-04-03 NOTE — Pre-Procedure Instructions (Signed)
Tonya [name]  04/03/2012   Your procedure is scheduled on:  Thursday  04/04/12   Report to [facility] at 600 AM.  Call this number if you have problems the morning of surgery: [phone]   Remember:   Do not eat food or drink liquids after midnight.   Take these medicines the morning of surgery with A SIP OF WATER:  ALBUTEROL INHALER, CYMBALTA, HYDROCODONE IF NEEDED   Do not wear jewelry, make-up or nail polish.  Do not wear lotions, powders, or perfumes. You may wear deodorant.  Do not shave 48 hours prior to surgery. Men may shave face and neck.  Do not bring valuables to the hospital.  Contacts, dentures or bridgework may not be worn into surgery.  Leave suitcase in the car. After surgery it may be brought to your room.  For patients admitted to the hospital, checkout time is 11:00 AM the day of  discharge.   Patients discharged the day of surgery will not be allowed to drive  home.  Name and phone number of your driver:  Special Instructions: Shower using CHG 2 nights before surgery and the night before surgery.  If you shower the day of surgery use CHG.  Use special wash - you have one bottle of CHG for all showers.  You should use approximately 1/3 of the bottle for each shower.   Please read over the following fact sheets that you were given: Pain Booklet, Coughing and Deep Breathing, MRSA Information and Surgical Site Infection Prevention

## 2012-04-03 NOTE — H&P (Signed)
Orthopaedic Trauma Service H&P    Chief Complaint: L tibia pain, L tibia nonunion HPI:  27 y/o black female well known to [facility] for multitrauma sustained may 2013. Injuries included L tibial shaft fracture with significant bone loss, has already had an exchange nailing with grafting performed due to deformity of first nail.  Pt has had some further consolidation of her fx but still a nonunion. Pt presents for exchange nailing with ICBG  Past Medical History  Diagnosis Date  . Bipolar 1 disorder   . UTI (lower urinary tract infection) 10/20/11    being treated, has 4 doses abx left  . Suicide attempt   . Transfusion of blood during current hospitalization     SUMMER 2013    Past Surgical History  Procedure Laterality Date  . Amputation  06/29/2011    Procedure: AMPUTATION ABOVE KNEE;  Surgeon: Budd Palmer, MD;  Location: [facility] OR;  Service: Orthopedics;  Laterality: Right;  abovve knee amputation of right leg  . I&d extremity  06/29/2011    Procedure: IRRIGATION AND DEBRIDEMENT EXTREMITY;  Surgeon: Budd Palmer, MD;  Location: [facility] OR;  Service: Orthopedics;  Laterality: Right;  Irrigation and debridement of right forearm with tendon repair.  . Amputation  06/27/2011    Procedure: AMPUTATION ABOVE KNEE;  Surgeon: Budd Palmer, MD;  Location: [facility] OR;  Service: Orthopedics;  Laterality: Right;  . External fixation leg  06/27/2011    Procedure: EXTERNAL FIXATION LEG;  Surgeon: Budd Palmer, MD;  Location: [facility] OR;  Service: Orthopedics;  Laterality: Left;  . Tibia im nail insertion  07/14/2011    Procedure: INTRAMEDULLARY (IM) NAIL TIBIAL;  Surgeon: Budd Palmer, MD;  Location: [facility] OR;  Service: Orthopedics;  Laterality: Left;  . External fixation removal  07/14/2011    Procedure: REMOVAL EXTERNAL FIXATION LEG;  Surgeon: Budd Palmer, MD;  Location: [facility] OR;  Service: Orthopedics;  Laterality: Left;  . Tibia im nail insertion  10/24/2011    Procedure: INTRAMEDULLARY (IM) NAIL TIBIAL;   Surgeon: Budd Palmer, MD;  Location: [facility] OR;  Service: Orthopedics;  Laterality: Left;  . Hardware removal  10/24/2011    Procedure: HARDWARE REMOVAL;  Surgeon: Budd Palmer, MD;  Location: [facility] OR;  Service: Orthopedics;  Laterality: Left;  left tibial   . Nose surgery      BROKEN       No family history on file. Social History:  reports that she has never smoked. She does not have any smokeless tobacco history on file. She reports that she does not drink alcohol or use illicit drugs.  Allergies: No Known Allergies  No prescriptions prior to admission    Results for orders placed during the hospital encounter of 04/03/12 (from the past 48 hour(s))  SURGICAL PCR SCREEN     Status: Abnormal   Collection Time    04/03/12 12:41 PM      Result Value Range   MRSA, PCR NEGATIVE  NEGATIVE   Staphylococcus aureus POSITIVE (*) NEGATIVE   Comment:            The Xpert SA Assay (FDA     approved for NASAL specimens     in patients over 79 years of age),     is one component of     a comprehensive surveillance     program.  Test performance has     been validated by [employer] for patients greater  than or equal to 93 year old.     It is not intended     to diagnose infection nor to     guide or monitor treatment.  URINALYSIS, ROUTINE W REFLEX MICROSCOPIC     Status: Abnormal   Collection Time    04/03/12 12:42 PM      Result Value Range   Color, Urine YELLOW  YELLOW   APPearance CLEAR  CLEAR   Specific Gravity, Urine 1.017  1.005 - 1.030   pH 8.0  5.0 - 8.0   Glucose, UA NEGATIVE  NEGATIVE mg/dL   Hgb urine dipstick TRACE (*) NEGATIVE   Bilirubin Urine NEGATIVE  NEGATIVE   Ketones, ur NEGATIVE  NEGATIVE mg/dL   Protein, ur NEGATIVE  NEGATIVE mg/dL   Urobilinogen, UA 0.2  0.0 - 1.0 mg/dL   Nitrite NEGATIVE  NEGATIVE   Leukocytes, UA NEGATIVE  NEGATIVE  URINE MICROSCOPIC-ADD ON     Status: None   Collection Time    04/03/12 12:42 PM      Result Value Range    Squamous Epithelial / LPF RARE  RARE   WBC, UA 0-2  <3 WBC/hpf   RBC / HPF 0-2  <3 RBC/hpf  APTT     Status: None   Collection Time    04/03/12 12:43 PM      Result Value Range   aPTT 35  24 - 37 seconds  BASIC METABOLIC PANEL     Status: None   Collection Time    04/03/12 12:43 PM      Result Value Range   Sodium 140  135 - 145 mEq/L   Potassium 3.7  3.5 - 5.1 mEq/L   Chloride 99  96 - 112 mEq/L   CO2 29  19 - 32 mEq/L   Glucose, Bld 89  70 - 99 mg/dL   BUN 6  6 - 23 mg/dL   Creatinine, Ser 1.61  0.50 - 1.10 mg/dL   Calcium 9.8  8.4 - 09.6 mg/dL   GFR calc non Af Amer >90  >90 mL/min   GFR calc Af Amer >90  >90 mL/min   Comment:            The eGFR has been calculated     using the CKD EPI equation.     This calculation has not been     validated in all clinical     situations.     eGFR's persistently     <90 mL/min signify     possible Chronic Kidney Disease.  CBC WITH DIFFERENTIAL     Status: Abnormal   Collection Time    04/03/12 12:43 PM      Result Value Range   WBC 7.9  4.0 - 10.5 K/uL   RBC 4.85  3.87 - 5.11 MIL/uL   Hemoglobin 14.4  12.0 - 15.0 g/dL   HCT 04.5  40.9 - 81.1 %   MCV 83.5  78.0 - 100.0 fL   MCH 29.7  26.0 - 34.0 pg   MCHC 35.6  30.0 - 36.0 g/dL   RDW 91.4  78.2 - 95.6 %   Platelets 246  150 - 400 K/uL   Neutrophils Relative 41 (*) 43 - 77 %   Neutro Abs 3.2  1.7 - 7.7 K/uL   Lymphocytes Relative 49 (*) 12 - 46 %   Lymphs Abs 3.8  0.7 - 4.0 K/uL   Monocytes Relative 7  3 - 12 %  Monocytes Absolute 0.5  0.1 - 1.0 K/uL   Eosinophils Relative 3  0 - 5 %   Eosinophils Absolute 0.2  0.0 - 0.7 K/uL   Basophils Relative 0  0 - 1 %   Basophils Absolute 0.0  0.0 - 0.1 K/uL  PROTIME-INR     Status: None   Collection Time    04/03/12 12:43 PM      Result Value Range   Prothrombin Time 12.6  11.6 - 15.2 seconds   INR 0.95  0.00 - 1.49  HCG, SERUM, QUALITATIVE     Status: None   Collection Time    04/03/12 12:43 PM      Result Value Range    Preg, Serum NEGATIVE  NEGATIVE   Comment:            THE SENSITIVITY OF THIS     METHODOLOGY IS >10 mIU/mL.   No results found.  Review of Systems  Musculoskeletal:       Left leg pain  All other systems reviewed and are negative.    Last menstrual period 01/18/2011. Physical Exam  Constitutional: She is oriented to person, place, and time. She appears well-developed and well-nourished.  HENT:  Head: Normocephalic and atraumatic.  Eyes: Pupils are equal, round, and reactive to light.  Cardiovascular: Normal rate and regular rhythm.   Respiratory: Effort normal and breath sounds normal.  GI: Soft. Bowel sounds are normal. She exhibits no distension. There is no tenderness.  Musculoskeletal:  Left leg   Pain over mid tibia   Motor and sensory functions intact   Ext warm   + DP pulse   Swelling stable   Wounds stable  R AKA  Neurological: She is alert and oriented to person, place, and time. No sensory deficit.  Skin: Skin is warm and intact.  Psychiatric: She has a normal mood and affect. Her speech is normal and behavior is normal. Cognition and memory are normal.     Assessment/Plan  27 y/o female L tibial shaft nonunion  OR for exchange nail L tibia with ICBG  Admit for pain control overnight and d/c on Friday Risks and benefits reviewed with pt, she understands and elects to proceed with surgical intervention  Mearl Latin, PA-C [facility] [phone] (P) 04/03/2012, 8:31 PM

## 2012-04-04 ENCOUNTER — Encounter (HOSPITAL_COMMUNITY): Payer: Self-pay | Admitting: Certified Registered Nurse Anesthetist

## 2012-04-04 ENCOUNTER — Encounter (HOSPITAL_COMMUNITY): Payer: Self-pay | Admitting: *Deleted

## 2012-04-04 ENCOUNTER — Inpatient Hospital Stay (HOSPITAL_COMMUNITY)
Admission: RE | Admit: 2012-04-04 | Discharge: 2012-04-10 | DRG: 493 | Disposition: A | Discharge status: Home Health | Payer: Medicare Other | Source: Ambulatory Visit | Attending: Orthopedic Surgery | Admitting: Orthopedic Surgery

## 2012-04-04 ENCOUNTER — Ambulatory Visit (HOSPITAL_COMMUNITY): Payer: Medicare Other | Admitting: Certified Registered Nurse Anesthetist

## 2012-04-04 ENCOUNTER — Ambulatory Visit (HOSPITAL_COMMUNITY): Payer: Medicare Other

## 2012-04-04 ENCOUNTER — Encounter (HOSPITAL_COMMUNITY)
Admission: RE | Disposition: A | Discharge status: Home Health | Payer: Self-pay | Source: Ambulatory Visit | Attending: Orthopedic Surgery

## 2012-04-04 ENCOUNTER — Observation Stay (HOSPITAL_COMMUNITY): Payer: Medicare Other

## 2012-04-04 DIAGNOSIS — T84498A Other mechanical complication of other internal orthopedic devices, implants and grafts, initial encounter: Secondary | ICD-10-CM | POA: Diagnosis present

## 2012-04-04 DIAGNOSIS — Z89619 Acquired absence of unspecified leg above knee: Secondary | ICD-10-CM

## 2012-04-04 DIAGNOSIS — Y831 Surgical operation with implant of artificial internal device as the cause of abnormal reaction of the patient, or of later complication, without mention of misadventure at the time of the procedure: Secondary | ICD-10-CM | POA: Diagnosis present

## 2012-04-04 DIAGNOSIS — Z6841 Body Mass Index (BMI) 40.0 and over, adult: Secondary | ICD-10-CM

## 2012-04-04 DIAGNOSIS — Z01812 Encounter for preprocedural laboratory examination: Secondary | ICD-10-CM

## 2012-04-04 DIAGNOSIS — F319 Bipolar disorder, unspecified: Secondary | ICD-10-CM | POA: Diagnosis present

## 2012-04-04 DIAGNOSIS — G4730 Sleep apnea, unspecified: Secondary | ICD-10-CM | POA: Diagnosis present

## 2012-04-04 DIAGNOSIS — S78119A Complete traumatic amputation at level between unspecified hip and knee, initial encounter: Secondary | ICD-10-CM

## 2012-04-04 DIAGNOSIS — S8290XS Unspecified fracture of unspecified lower leg, sequela: Secondary | ICD-10-CM

## 2012-04-04 DIAGNOSIS — Z79899 Other long term (current) drug therapy: Secondary | ICD-10-CM

## 2012-04-04 DIAGNOSIS — IMO0002 Reserved for concepts with insufficient information to code with codable children: Principal | ICD-10-CM | POA: Diagnosis present

## 2012-04-04 HISTORY — PX: TIBIA IM NAIL INSERTION: SHX2516

## 2012-04-04 HISTORY — PX: HARDWARE REMOVAL: SHX979

## 2012-04-04 LAB — URINE CULTURE: Colony Count: NO GROWTH

## 2012-04-04 SURGERY — INSERTION, INTRAMEDULLARY ROD, TIBIA
Anesthesia: General | Site: Leg Lower | Laterality: Left | Wound class: Clean

## 2012-04-04 MED ORDER — SUCCINYLCHOLINE CHLORIDE 20 MG/ML IJ SOLN
INTRAMUSCULAR | Status: DC | PRN
  Administered 2012-04-04: 150 mg via INTRAVENOUS

## 2012-04-04 MED ORDER — ALBUTEROL SULFATE HFA 108 (90 BASE) MCG/ACT IN AERS
2.0000 | INHALATION_SPRAY | Freq: Four times a day (QID) | RESPIRATORY_TRACT | Status: DC | PRN
  Filled 2012-04-04: qty 6.7

## 2012-04-04 MED ORDER — LACTATED RINGERS IV SOLN
INTRAVENOUS | Status: DC | PRN
  Administered 2012-04-04 (×3): via INTRAVENOUS

## 2012-04-04 MED ORDER — QUETIAPINE FUMARATE ER 50 MG PO TB24
50.0000 mg | ORAL_TABLET | Freq: Every day | ORAL | Status: DC
  Administered 2012-04-04 – 2012-04-09 (×6): 50 mg via ORAL
  Filled 2012-04-04 (×7): qty 1

## 2012-04-04 MED ORDER — ONDANSETRON HCL 4 MG/2ML IJ SOLN: 4.0000 mg | Freq: Four times a day (QID) | INTRAMUSCULAR | Status: DC | PRN

## 2012-04-04 MED ORDER — GABAPENTIN 300 MG PO CAPS
300.0000 mg | ORAL_CAPSULE | Freq: Every day | ORAL | Status: DC
  Administered 2012-04-04 – 2012-04-09 (×6): 300 mg via ORAL
  Filled 2012-04-04 (×7): qty 1

## 2012-04-04 MED ORDER — METOCLOPRAMIDE HCL 5 MG/ML IJ SOLN: 5.0000 mg | Freq: Three times a day (TID) | INTRAMUSCULAR | Status: DC | PRN

## 2012-04-04 MED ORDER — OLANZAPINE 7.5 MG PO TABS
15.0000 mg | ORAL_TABLET | Freq: Every day | ORAL | Status: DC
  Administered 2012-04-04 – 2012-04-09 (×6): 15 mg via ORAL
  Filled 2012-04-04 (×7): qty 2

## 2012-04-04 MED ORDER — BUPIVACAINE LIPOSOME 1.3 % IJ SUSP
20.0000 mL | Freq: Once | INTRAMUSCULAR | Status: AC
  Administered 2012-04-04: 20 mL
  Filled 2012-04-04: qty 20

## 2012-04-04 MED ORDER — MIDAZOLAM HCL 5 MG/5ML IJ SOLN
INTRAMUSCULAR | Status: DC | PRN
  Administered 2012-04-04 (×2): 1 mg via INTRAVENOUS

## 2012-04-04 MED ORDER — ENOXAPARIN SODIUM 40 MG/0.4ML ~~LOC~~ SOLN
40.0000 mg | SUBCUTANEOUS | Status: DC
  Administered 2012-04-05 – 2012-04-10 (×6): 40 mg via SUBCUTANEOUS
  Filled 2012-04-04 (×7): qty 0.4

## 2012-04-04 MED ORDER — METHOCARBAMOL 100 MG/ML IJ SOLN
500.0000 mg | Freq: Four times a day (QID) | INTRAVENOUS | Status: DC | PRN
  Filled 2012-04-04: qty 5

## 2012-04-04 MED ORDER — LACTATED RINGERS IV SOLN: INTRAVENOUS | Status: DC

## 2012-04-04 MED ORDER — GLYCOPYRROLATE 0.2 MG/ML IJ SOLN
INTRAMUSCULAR | Status: DC | PRN
  Administered 2012-04-04: 0.6 mg via INTRAVENOUS

## 2012-04-04 MED ORDER — HYDROMORPHONE HCL PF 1 MG/ML IJ SOLN
0.2500 mg | INTRAMUSCULAR | Status: DC | PRN
  Administered 2012-04-04 (×2): 0.5 mg via INTRAVENOUS

## 2012-04-04 MED ORDER — PROPOFOL 10 MG/ML IV BOLUS
INTRAVENOUS | Status: DC | PRN
  Administered 2012-04-04: 30 mg via INTRAVENOUS
  Administered 2012-04-04: 130 mg via INTRAVENOUS
  Administered 2012-04-04: 100 mg via INTRAVENOUS

## 2012-04-04 MED ORDER — HYDROMORPHONE 0.3 MG/ML IV SOLN
INTRAVENOUS | Status: DC
  Administered 2012-04-05: 3.8 mg via INTRAVENOUS

## 2012-04-04 MED ORDER — CHLORHEXIDINE GLUCONATE 4 % EX LIQD: 60.0000 mL | Freq: Once | CUTANEOUS | Status: DC

## 2012-04-04 MED ORDER — LACTATED RINGERS IV SOLN
INTRAVENOUS | Status: DC
  Administered 2012-04-04: 13:00:00 via INTRAVENOUS

## 2012-04-04 MED ORDER — DEXTROSE 5 % IV SOLN
INTRAVENOUS | Status: DC | PRN
  Administered 2012-04-04: 14:00:00 via INTRAVENOUS

## 2012-04-04 MED ORDER — SODIUM CHLORIDE 0.9 % IJ SOLN: 9.0000 mL | INTRAMUSCULAR | Status: DC | PRN

## 2012-04-04 MED ORDER — NALOXONE HCL 0.4 MG/ML IJ SOLN
0.4000 mg | INTRAMUSCULAR | Status: DC | PRN
  Administered 2012-04-05: 0.4 mg via INTRAVENOUS
  Filled 2012-04-04: qty 1

## 2012-04-04 MED ORDER — MUPIROCIN 2 % EX OINT
TOPICAL_OINTMENT | CUTANEOUS | Status: AC
  Administered 2012-04-04: 08:00:00 via NASAL
  Filled 2012-04-04: qty 22

## 2012-04-04 MED ORDER — ACETAMINOPHEN 10 MG/ML IV SOLN
INTRAVENOUS | Status: AC
  Filled 2012-04-04: qty 100

## 2012-04-04 MED ORDER — METHOCARBAMOL 500 MG PO TABS
500.0000 mg | ORAL_TABLET | Freq: Four times a day (QID) | ORAL | Status: DC | PRN
  Administered 2012-04-04 – 2012-04-06 (×4): 500 mg via ORAL
  Filled 2012-04-04 (×5): qty 1

## 2012-04-04 MED ORDER — HYDROMORPHONE 0.3 MG/ML IV SOLN
INTRAVENOUS | Status: AC
  Administered 2012-04-04: 0.5 mg via INTRAVENOUS
  Filled 2012-04-04: qty 25

## 2012-04-04 MED ORDER — POLYETHYLENE GLYCOL 3350 17 G PO PACK
17.0000 g | PACK | Freq: Every day | ORAL | Status: DC
  Administered 2012-04-06 – 2012-04-08 (×3): 17 g via ORAL
  Filled 2012-04-04 (×6): qty 1

## 2012-04-04 MED ORDER — OXYCODONE HCL 5 MG PO TABS
5.0000 mg | ORAL_TABLET | ORAL | Status: DC | PRN
  Administered 2012-04-05: 10 mg via ORAL
  Filled 2012-04-04: qty 2

## 2012-04-04 MED ORDER — FENTANYL CITRATE 0.05 MG/ML IJ SOLN
INTRAMUSCULAR | Status: DC | PRN
  Administered 2012-04-04: 100 ug via INTRAVENOUS
  Administered 2012-04-04: 50 ug via INTRAVENOUS
  Administered 2012-04-04: 100 ug via INTRAVENOUS
  Administered 2012-04-04 (×2): 50 ug via INTRAVENOUS
  Administered 2012-04-04: 100 ug via INTRAVENOUS
  Administered 2012-04-04: 50 ug via INTRAVENOUS
  Administered 2012-04-04 (×2): 100 ug via INTRAVENOUS
  Administered 2012-04-04: 50 ug via INTRAVENOUS

## 2012-04-04 MED ORDER — DIPHENHYDRAMINE HCL 12.5 MG/5ML PO ELIX: 12.5000 mg | ORAL_SOLUTION | Freq: Four times a day (QID) | ORAL | Status: DC | PRN

## 2012-04-04 MED ORDER — METOCLOPRAMIDE HCL 10 MG PO TABS
5.0000 mg | ORAL_TABLET | Freq: Three times a day (TID) | ORAL | Status: DC | PRN
  Administered 2012-04-08: 10 mg via ORAL
  Filled 2012-04-04: qty 1

## 2012-04-04 MED ORDER — LIDOCAINE HCL (CARDIAC) 20 MG/ML IV SOLN
INTRAVENOUS | Status: DC | PRN
  Administered 2012-04-04: 100 mg via INTRAVENOUS

## 2012-04-04 MED ORDER — POTASSIUM CHLORIDE IN NACL 20-0.9 MEQ/L-% IV SOLN
INTRAVENOUS | Status: DC
  Administered 2012-04-04: 22:00:00 via INTRAVENOUS
  Administered 2012-04-05: 20 mL/h via INTRAVENOUS
  Filled 2012-04-04 (×3): qty 1000

## 2012-04-04 MED ORDER — DULOXETINE HCL 60 MG PO CPEP
60.0000 mg | ORAL_CAPSULE | Freq: Every day | ORAL | Status: DC
  Filled 2012-04-04: qty 1

## 2012-04-04 MED ORDER — ACETAMINOPHEN 10 MG/ML IV SOLN
INTRAVENOUS | Status: DC | PRN
  Administered 2012-04-04: 1000 mg via INTRAVENOUS

## 2012-04-04 MED ORDER — CEFAZOLIN SODIUM 1-5 GM-% IV SOLN
1.0000 g | Freq: Four times a day (QID) | INTRAVENOUS | Status: AC
  Administered 2012-04-04 – 2012-04-05 (×3): 1 g via INTRAVENOUS
  Filled 2012-04-04 (×3): qty 50

## 2012-04-04 MED ORDER — DIPHENHYDRAMINE HCL 50 MG/ML IJ SOLN: 12.5000 mg | Freq: Four times a day (QID) | INTRAMUSCULAR | Status: DC | PRN

## 2012-04-04 MED ORDER — HEMOSTATIC AGENTS (NO CHARGE) OPTIME
TOPICAL | Status: DC | PRN
  Administered 2012-04-04: 1 via TOPICAL

## 2012-04-04 MED ORDER — ARTIFICIAL TEARS OP OINT
TOPICAL_OINTMENT | OPHTHALMIC | Status: DC | PRN
  Administered 2012-04-04: 1 via OPHTHALMIC

## 2012-04-04 MED ORDER — SODIUM CHLORIDE 0.9 % IJ SOLN
INTRAMUSCULAR | Status: AC
  Filled 2012-04-04: qty 3

## 2012-04-04 MED ORDER — HYDROMORPHONE HCL PF 1 MG/ML IJ SOLN
INTRAMUSCULAR | Status: AC
  Filled 2012-04-04: qty 1

## 2012-04-04 MED ORDER — ONDANSETRON HCL 4 MG/2ML IJ SOLN
INTRAMUSCULAR | Status: DC | PRN
  Administered 2012-04-04: 4 mg via INTRAVENOUS

## 2012-04-04 MED ORDER — 0.9 % SODIUM CHLORIDE (POUR BTL) OPTIME
TOPICAL | Status: DC | PRN
  Administered 2012-04-04: 1000 mL

## 2012-04-04 MED ORDER — DOCUSATE SODIUM 100 MG PO CAPS
100.0000 mg | ORAL_CAPSULE | Freq: Two times a day (BID) | ORAL | Status: DC
  Administered 2012-04-04 – 2012-04-10 (×12): 100 mg via ORAL
  Filled 2012-04-04 (×13): qty 1

## 2012-04-04 MED ORDER — ROCURONIUM BROMIDE 100 MG/10ML IV SOLN
INTRAVENOUS | Status: DC | PRN
  Administered 2012-04-04 (×2): 10 mg via INTRAVENOUS
  Administered 2012-04-04: 30 mg via INTRAVENOUS

## 2012-04-04 MED ORDER — ONDANSETRON HCL 4 MG PO TABS: 4.0000 mg | ORAL_TABLET | Freq: Four times a day (QID) | ORAL | Status: DC | PRN

## 2012-04-04 MED ORDER — NEOSTIGMINE METHYLSULFATE 1 MG/ML IJ SOLN
INTRAMUSCULAR | Status: DC | PRN
  Administered 2012-04-04: 4 mg via INTRAVENOUS

## 2012-04-04 MED ORDER — OXYCODONE-ACETAMINOPHEN 5-325 MG PO TABS
1.0000 | ORAL_TABLET | Freq: Four times a day (QID) | ORAL | Status: DC | PRN
  Administered 2012-04-05: 2 via ORAL
  Filled 2012-04-04: qty 2

## 2012-04-04 SURGICAL SUPPLY — 88 items
BANDAGE ELASTIC 4 VELCRO ST LF (GAUZE/BANDAGES/DRESSINGS) ×2 IMPLANT
BANDAGE ELASTIC 6 VELCRO ST LF (GAUZE/BANDAGES/DRESSINGS) ×2 IMPLANT
BANDAGE ESMARK 6X9 LF (GAUZE/BANDAGES/DRESSINGS) ×1 IMPLANT
BANDAGE GAUZE ELAST BULKY 4 IN (GAUZE/BANDAGES/DRESSINGS) ×4 IMPLANT
BIT DRILL 3.8X6 NS (BIT) ×2 IMPLANT
BIT DRILL 4.4 NS (BIT) ×2 IMPLANT
BLADE SURG 10 STRL SS (BLADE) ×4 IMPLANT
BNDG COHESIVE 6X5 TAN STRL LF (GAUZE/BANDAGES/DRESSINGS) ×2 IMPLANT
BNDG ESMARK 6X9 LF (GAUZE/BANDAGES/DRESSINGS) ×2
BONE CANC CHIPS 20CC PCAN1/4 (Bone Implant) ×2 IMPLANT
BRUSH SCRUB DISP (MISCELLANEOUS) ×4 IMPLANT
CHIPS CANC BONE 20CC PCAN1/4 (Bone Implant) ×1 IMPLANT
CLEANER TIP ELECTROSURG 2X2 (MISCELLANEOUS) ×2 IMPLANT
CLOTH BEACON ORANGE TIMEOUT ST (SAFETY) ×2 IMPLANT
COVER SURGICAL LIGHT HANDLE (MISCELLANEOUS) ×4 IMPLANT
CUFF TOURNIQUET SINGLE 18IN (TOURNIQUET CUFF) IMPLANT
CUFF TOURNIQUET SINGLE 24IN (TOURNIQUET CUFF) IMPLANT
CUFF TOURNIQUET SINGLE 34IN LL (TOURNIQUET CUFF) IMPLANT
DRAPE C-ARM 42X72 X-RAY (DRAPES) ×2 IMPLANT
DRAPE C-ARMOR (DRAPES) ×2 IMPLANT
DRAPE EXTREMITY T 121X128X90 (DRAPE) ×4 IMPLANT
DRAPE INCISE IOBAN 66X45 STRL (DRAPES) ×2 IMPLANT
DRAPE OEC MINIVIEW 54X84 (DRAPES) ×2 IMPLANT
DRAPE ORTHO SPLIT 77X108 STRL (DRAPES) ×1
DRAPE SURG ORHT 6 SPLT 77X108 (DRAPES) ×1 IMPLANT
DRAPE U-SHAPE 47X51 STRL (DRAPES) ×2 IMPLANT
DRSG ADAPTIC 3X8 NADH LF (GAUZE/BANDAGES/DRESSINGS) ×2 IMPLANT
DRSG MEPILEX BORDER 4X8 (GAUZE/BANDAGES/DRESSINGS) ×2 IMPLANT
DRSG PAD ABDOMINAL 8X10 ST (GAUZE/BANDAGES/DRESSINGS) ×4 IMPLANT
ELECT REM PT RETURN 9FT ADLT (ELECTROSURGICAL) ×2
ELECTRODE REM PT RTRN 9FT ADLT (ELECTROSURGICAL) ×1 IMPLANT
EVACUATOR 1/8 PVC DRAIN (DRAIN) IMPLANT
GLOVE BIO SURGEON STRL SZ7 (GLOVE) ×2 IMPLANT
GLOVE BIO SURGEON STRL SZ7.5 (GLOVE) ×2 IMPLANT
GLOVE BIO SURGEON STRL SZ8 (GLOVE) ×2 IMPLANT
GLOVE BIO SURGEON STRL SZ8.5 (GLOVE) ×2 IMPLANT
GLOVE BIOGEL PI IND STRL 7.5 (GLOVE) ×1 IMPLANT
GLOVE BIOGEL PI IND STRL 8 (GLOVE) ×1 IMPLANT
GLOVE BIOGEL PI INDICATOR 7.5 (GLOVE) ×1
GLOVE BIOGEL PI INDICATOR 8 (GLOVE) ×1
GLOVE SURG SS PI 7.0 STRL IVOR (GLOVE) ×2 IMPLANT
GOWN PREVENTION PLUS XLARGE (GOWN DISPOSABLE) ×2 IMPLANT
GOWN STRL NON-REIN LRG LVL3 (GOWN DISPOSABLE) ×6 IMPLANT
GUIDEWIRE BALL NOSE 80CM (WIRE) ×2 IMPLANT
KIT BASIN OR (CUSTOM PROCEDURE TRAY) ×2 IMPLANT
KIT ROOM TURNOVER OR (KITS) ×2 IMPLANT
MANIFOLD NEPTUNE II (INSTRUMENTS) ×2 IMPLANT
NAIL IM TIBIAL 10X34.5 (Orthopedic Implant) ×1 IMPLANT
NEEDLE 22X1 1/2 (OR ONLY) (NEEDLE) IMPLANT
NEEDLE HYPO 25GX1X1/2 BEV (NEEDLE) ×2 IMPLANT
NS IRRIG 1000ML POUR BTL (IV SOLUTION) ×2 IMPLANT
PACK GENERAL/GYN (CUSTOM PROCEDURE TRAY) ×2 IMPLANT
PACK ORTHO EXTREMITY (CUSTOM PROCEDURE TRAY) ×2 IMPLANT
PAD ARMBOARD 7.5X6 YLW CONV (MISCELLANEOUS) ×4 IMPLANT
PADDING CAST COTTON 6X4 STRL (CAST SUPPLIES) ×4 IMPLANT
PIN GUIDE ACE (PIN) ×2 IMPLANT
SCREW ACECAP 34MM (Screw) ×2 IMPLANT
SCREW ACECAP 36MM (Screw) ×2 IMPLANT
SCREW PROXIMAL DEPUY (Screw) ×2 IMPLANT
SCREW PRXML FT 55X5.5XNS TIB (Screw) ×1 IMPLANT
SCREW PRXML FT 60X5.5XNS LF (Screw) ×1 IMPLANT
SPONGE GAUZE 4X4 12PLY (GAUZE/BANDAGES/DRESSINGS) ×2 IMPLANT
SPONGE LAP 18X18 X RAY DECT (DISPOSABLE) ×4 IMPLANT
SPONGE SCRUB IODOPHOR (GAUZE/BANDAGES/DRESSINGS) ×2 IMPLANT
SPONGE SURGIFOAM ABS GEL 100 (HEMOSTASIS) ×2 IMPLANT
STAPLER VISISTAT 35W (STAPLE) ×2 IMPLANT
STOCKINETTE IMPERVIOUS LG (DRAPES) ×2 IMPLANT
STRIP CLOSURE SKIN 1/2X4 (GAUZE/BANDAGES/DRESSINGS) ×2 IMPLANT
SUCTION FRAZIER TIP 10 FR DISP (SUCTIONS) IMPLANT
SUT ETHILON 2 0 PSLX (SUTURE) ×4 IMPLANT
SUT ETHILON 3 0 PS 1 (SUTURE) ×2 IMPLANT
SUT PDS AB 2-0 CT1 27 (SUTURE) IMPLANT
SUT PROLENE 3 0 PS 2 (SUTURE) IMPLANT
SUT VIC AB 0 CT1 27 (SUTURE) ×2
SUT VIC AB 0 CT1 27XBRD ANBCTR (SUTURE) ×2 IMPLANT
SUT VIC AB 2-0 CT1 27 (SUTURE)
SUT VIC AB 2-0 CT1 TAPERPNT 27 (SUTURE) IMPLANT
SUT VIC AB 2-0 CT3 27 (SUTURE) IMPLANT
SUT VIC AB 2-0 CTB1 (SUTURE) ×4 IMPLANT
SYR CONTROL 10ML LL (SYRINGE) ×2 IMPLANT
TIBIAL NAIL 10X34.5 (Orthopedic Implant) ×2 IMPLANT
TOWEL OR 17X24 6PK STRL BLUE (TOWEL DISPOSABLE) ×4 IMPLANT
TOWEL OR 17X26 10 PK STRL BLUE (TOWEL DISPOSABLE) ×4 IMPLANT
TRAY FOLEY CATH 14FR (SET/KITS/TRAYS/PACK) ×2 IMPLANT
TUBE CONNECTING 12X1/4 (SUCTIONS) ×2 IMPLANT
UNDERPAD 30X30 INCONTINENT (UNDERPADS AND DIAPERS) ×2 IMPLANT
WATER STERILE IRR 1000ML POUR (IV SOLUTION) ×4 IMPLANT
YANKAUER SUCT BULB TIP NO VENT (SUCTIONS) ×2 IMPLANT

## 2012-04-04 NOTE — Anesthesia Procedure Notes (Signed)
Procedure Name: Intubation Date/Time: 04/04/2012 2:19 PM Performed by: Darcey Nora B Pre-anesthesia Checklist: Patient identified, Emergency Drugs available, Suction available and Patient being monitored Patient Re-evaluated:Patient Re-evaluated prior to inductionOxygen Delivery Method: Circle system utilized Preoxygenation: Pre-oxygenation with 100% oxygen Intubation Type: IV induction Ventilation: Mask ventilation without difficulty Laryngoscope Size: Mac and 4 Grade View: Grade II Tube type: Oral Tube size: 7.5 mm Number of attempts: 1 Airway Equipment and Method: Stylet Placement Confirmation: ETT inserted through vocal cords under direct vision,  positive ETCO2 and breath sounds checked- equal and bilateral Secured at: 22 (cm at teeth) cm Tube secured with: Tape Dental Injury: Teeth and Oropharynx as per pre-operative assessment

## 2012-04-04 NOTE — OR Nursing (Signed)
17mL of exparel was used intraoperatively.  Charting will not allow me to adjust the dose.  Garfield Cornea, RN [phone] 04/04/2012

## 2012-04-04 NOTE — H&P (Signed)
I have seen and examined the patient. I agree with the findings above.  Left tibia nonunion and the patient has opted for iliac crest bone grafting.  I discussed with the patient the risks and benefits of surgery, including the possibility of persistent nonunion, infection, nerve injury, vessel injury, wound breakdown, arthritis, symptomatic hardware, DVT/ PE, loss of motion, and need for further surgery among others.  She understood these risks and wished to proceed.   Budd Palmer, MD 04/04/2012 2:06 PM

## 2012-04-04 NOTE — Anesthesia Postprocedure Evaluation (Signed)
  Anesthesia Post-op Note  Patient: Tonya [name]  Procedure(s) Performed: Procedure(s): INTRAMEDULLARY (IM) NAIL TIBIAL (Left) HARDWARE REMOVAL (Left)  Patient Location: PACU  Anesthesia Type:General  Level of Consciousness: awake  Airway and Oxygen Therapy: Patient Spontanous Breathing  Post-op Pain: mild  Post-op Assessment: Post-op Vital signs reviewed  Post-op Vital Signs: Reviewed  Complications: No apparent anesthesia complications

## 2012-04-04 NOTE — Progress Notes (Signed)
Dr. Randa Evens called for a sign out said may go if happy with patient. Patient using PCA and sleeping unless disturbee

## 2012-04-04 NOTE — Anesthesia Preprocedure Evaluation (Addendum)
Anesthesia Evaluation  Patient identified by MRN, date of birth, ID band Patient awake    Reviewed: Allergy & Precautions, H&P , NPO status , Patient's Chart, lab work & pertinent test results, reviewed documented beta blocker date and time   Airway Mallampati: I TM Distance: >3 FB Neck ROM: Full    Dental  (+) Teeth Intact, Caps and Dental Advisory Given   Pulmonary  Pt denies COPD or spirometry but has a Proventil inhaler which she uses prn if SOB         Cardiovascular     Neuro/Psych    GI/Hepatic   Endo/Other  Morbid obesity  Renal/GU      Musculoskeletal   Abdominal   Peds  Hematology   Anesthesia Other Findings Front top X2 capped or crowned or ?implants ("fake")  Reproductive/Obstetrics                          Anesthesia Physical Anesthesia Plan  ASA: III  Anesthesia Plan: General   Post-op Pain Management:    Induction: Intravenous  Airway Management Planned: Oral ETT  Additional Equipment:   Intra-op Plan:   Post-operative Plan: Extubation in OR  Informed Consent: I have reviewed the patients History and Physical, chart, labs and discussed the procedure including the risks, benefits and alternatives for the proposed anesthesia with the patient or authorized representative who has indicated his/her understanding and acceptance.     Plan Discussed with: CRNA and Surgeon  Anesthesia Plan Comments:         Anesthesia Quick Evaluation

## 2012-04-04 NOTE — Preoperative (Signed)
Beta Blockers   Reason not to administer Beta Blockers:Not Applicable 

## 2012-04-04 NOTE — Progress Notes (Signed)
Orthopedic Tech Progress Note Patient Details:  Tonya [name] 11-Jun-1985 [id number] Applied overhead frame to bed.     [name] 04/04/2012, 9:30 PM

## 2012-04-04 NOTE — Brief Op Note (Signed)
04/04/2012  6:12 PM  PATIENT:  Tonya [name]  27 y.o. female  PRE-OPERATIVE DIAGNOSIS:  Left Tibia Non Union  POST-OPERATIVE DIAGNOSIS:  Left Tibia Non Union  PROCEDURE:  Procedure(s): INTRAMEDULLARY (IM) NAIL TIBIAL (Left) HARDWARE REMOVAL (Left) Repair of nonunion left tibia with iliac crest bone grafting  SURGEON:  Surgeon(s) and Role:    * Budd Palmer, MD - Primary  PHYSICIAN ASSISTANT: Montez Morita, [facility]  ANESTHESIA:   general  EBL:  Total I/O In: 2050 [I.V.:2050] Out: 300 [Blood:300]  BLOOD ADMINISTERED:none  DRAINS: none   LOCAL MEDICATIONS USED:  MARCAINE     SPECIMEN:  Source of Specimen:  reamings, nonunion site tibia  DISPOSITION OF SPECIMEN:  micro  COUNTS:  YES  TOURNIQUET:  * No tourniquets in log *  DICTATION: .Other Dictation: Dictation Number [id number]  PLAN OF CARE: Admit to inpatient   PATIENT DISPOSITION:  PACU - hemodynamically stable.   Delay start of Pharmacological VTE agent (>24hrs) due to surgical blood loss or risk of bleeding: no

## 2012-04-04 NOTE — Transfer of Care (Signed)
Immediate Anesthesia Transfer of Care Note  Patient: Tonya [name]  Procedure(s) Performed: Procedure(s): INTRAMEDULLARY (IM) NAIL TIBIAL (Left) HARDWARE REMOVAL (Left)  Patient Location: PACU  Anesthesia Type:General  Level of Consciousness: awake, alert  and patient cooperative  Airway & Oxygen Therapy: Patient Spontanous Breathing and Patient connected to face mask oxygen  Post-op Assessment: Report given to PACU RN and Post -op Vital signs reviewed and stable  Post vital signs: Reviewed and stable  Complications: No apparent anesthesia complications

## 2012-04-05 ENCOUNTER — Encounter: Payer: Medicare Other | Admitting: Physical Therapy

## 2012-04-05 DIAGNOSIS — Z89619 Acquired absence of unspecified leg above knee: Secondary | ICD-10-CM

## 2012-04-05 DIAGNOSIS — IMO0002 Reserved for concepts with insufficient information to code with codable children: Principal | ICD-10-CM

## 2012-04-05 LAB — BASIC METABOLIC PANEL
CO2: 28 mEq/L (ref 19–32)
Calcium: 8.2 mg/dL — ABNORMAL LOW (ref 8.4–10.5)
Chloride: 102 mEq/L (ref 96–112)
Creatinine, Ser: 0.83 mg/dL (ref 0.50–1.10)
GFR calc Af Amer: >90 mL/min (ref >90)
Sodium: 137 mEq/L (ref 135–145)

## 2012-04-05 LAB — GLUCOSE, CAPILLARY: Glucose-Capillary: 286 mg/dL — ABNORMAL HIGH (ref 70–99)

## 2012-04-05 LAB — CBC
MCH: 28.4 pg (ref 26.0–34.0)
MCV: 82.8 fL (ref 78.0–100.0)
Platelets: 211 10*3/uL (ref 150–400)
RBC: 3.95 MIL/uL (ref 3.87–5.11)
RDW: 13.9 % (ref 11.5–15.5)
WBC: 14 10*3/uL — ABNORMAL HIGH (ref 4.0–10.5)

## 2012-04-05 MED ORDER — ACETAMINOPHEN 325 MG PO TABS: 325.0000 mg | ORAL_TABLET | Freq: Four times a day (QID) | ORAL | Status: DC | PRN

## 2012-04-05 MED ORDER — HYDROCODONE-ACETAMINOPHEN 7.5-325 MG PO TABS: 1.0000 | ORAL_TABLET | Freq: Four times a day (QID) | ORAL | Status: DC | PRN

## 2012-04-05 MED ORDER — DSS 100 MG PO CAPS: 100.0000 mg | ORAL_CAPSULE | Freq: Two times a day (BID) | ORAL | Status: DC

## 2012-04-05 MED ORDER — ENOXAPARIN SODIUM 40 MG/0.4ML ~~LOC~~ SOLN: 40.0000 mg | SUBCUTANEOUS | Status: DC

## 2012-04-05 MED ORDER — ACETAMINOPHEN 325 MG PO TABS
325.0000 mg | ORAL_TABLET | Freq: Four times a day (QID) | ORAL | Status: DC | PRN
  Administered 2012-04-05: 650 mg via ORAL
  Filled 2012-04-05: qty 2

## 2012-04-05 MED ORDER — METHOCARBAMOL 500 MG PO TABS: 500.0000 mg | ORAL_TABLET | Freq: Four times a day (QID) | ORAL | Status: DC | PRN

## 2012-04-05 MED ORDER — OXYCODONE HCL 5 MG PO TABS: 5.0000 mg | ORAL_TABLET | ORAL | Status: DC | PRN

## 2012-04-05 MED ORDER — DULOXETINE HCL 60 MG PO CPEP
60.0000 mg | ORAL_CAPSULE | Freq: Every day | ORAL | Status: DC
  Administered 2012-04-05 – 2012-04-09 (×5): 60 mg via ORAL
  Filled 2012-04-05 (×5): qty 1

## 2012-04-05 MED ORDER — MORPHINE SULFATE 2 MG/ML IJ SOLN
1.0000 mg | INTRAMUSCULAR | Status: DC | PRN
  Administered 2012-04-05: 2 mg via INTRAVENOUS
  Filled 2012-04-05: qty 1

## 2012-04-05 MED ORDER — OXYCODONE HCL 5 MG PO TABS
5.0000 mg | ORAL_TABLET | ORAL | Status: DC | PRN
  Administered 2012-04-05 – 2012-04-09 (×9): 10 mg via ORAL
  Filled 2012-04-05 (×9): qty 2

## 2012-04-05 NOTE — Progress Notes (Signed)
Patient admitted from PACU at 2110. Patient was arousable. Answered all questions appropriately. Stated she was in excruciating pain. Ordered Dilaudid Full dose PCA. Instructed to use pain pump when in pain. She asked, what happens when I am asleep. Told she is the only one that can push the button. If pain gets bad enough, it will wake her and she will push the button. Pump was beeping because EtCO2 was bouncing from 90 down to 40. Patient was complaining of beeping but told she has to be monitored while on the PCA. We went over admission history and she said, I am going to sleep. So I said, ok, I will be back to check on you.  Went in at to clear PCA. Cleared 3.8mg  and patient was in a deep sleep as she was when she got on the floor. Monitors on and still bouncing from 90 to 40. Stayed at 48 and I left the room.   Approximately 0220, NT was doing q4 vitals, called in CN, patient was unresponsive. Artist. Went to assess patient and was very difficult to arouse. Patient was gurgling. Obtained and administered Narcan. Rapid Response came up. Patient aroused and stated she was scared. She asked, did the pain meds do this? Did, I push the button too much. Told Patient she has not pushed the button since I cleared it at . Reassured her that it could have been a combination of both the anesthesia and pain meds. She asked it Clinical research associate could call her mom. Called the physician on call Altamese Cabal, PA). He stated to go ahead and d/c the PCA pump and let her rest throughout the night. He asked about her pain pills. Told him that she does have PO pain meds ordered. Called patient's  Mom in [place]. Reassured that patient was ok. Patient complained of being cold and thirsty. Needs met. Patient stated she does not want anymore pain med. Told tonight, the doctor wants her to rest and if she needs pain meds tomorrow she have pills ordered.  VS: 97.4, 120, 18, 112/46, 91% on 2L  Patient is now resting. No  distress noted.

## 2012-04-05 NOTE — Progress Notes (Signed)
Orthopaedic Trauma Service Progress Note  Subjective   Pt doing well this am  PCA d/c'd last night  Sounds as if pt was anxious about using PCA   Notes pain in Left leg, ICBG site doing well  Objective   BP 112/46  Pulse 122  Temp(Src) 99.3 F (37.4 C) (Oral)  Resp 18  Ht 5\' 5"  (1.651 m)  Wt 131.09 kg (289 lb)  BMI 48.09 kg/m2  SpO2 95%  LMP 01/18/2012  CBC    Component Value Date/Time   WBC 14.0* 04/05/2012 0505   RBC 3.95 04/05/2012 0505   HGB 11.2* 04/05/2012 0505   HCT 32.7* 04/05/2012 0505   PLT 211 04/05/2012 0505   MCV 82.8 04/05/2012 0505   MCH 28.4 04/05/2012 0505   MCHC 34.3 04/05/2012 0505   RDW 13.9 04/05/2012 0505   LYMPHSABS 3.8 04/03/2012 1243   MONOABS 0.5 04/03/2012 1243   EOSABS 0.2 04/03/2012 1243   BASOSABS 0.0 04/03/2012 1243    BMET    Component Value Date/Time   NA 137 04/05/2012 0505   K 5.7* 04/05/2012 0505   CL 102 04/05/2012 0505   CO2 28 04/05/2012 0505   GLUCOSE 122* 04/05/2012 0505   BUN 9 04/05/2012 0505   CREATININE 0.83 04/05/2012 0505   CALCIUM 8.2* 04/05/2012 0505   GFRNONAA >90 04/05/2012 0505   GFRAA >90 04/05/2012 0505     Micro- no growth to date, no organisms  Gen: awake and alert, NAD, appears comfortable, but anxious Lungs: clear B Cardiac: S1 and S2 Abd: soft, NT, + BS Ext  Left Lower extremity   Dressing stable   Scant drainage noted distally   Distal motor and sensory functions intact   Ext warm   + DP pulse     Compartments soft and NT   No pain with passive stretch    Dressing ICBG site stable  Assessment and Plan  27 y/o female s/p repair of L tibial shaft nonunion POD 1    1. L tibial shaft nonunion POD 1  PWB, 50%  ROM as tolerated  Therapies as tolerated  Ice and elevate  Dressing changes tomorrow 2. BPD  Continue home meds 3. Pain  IV morphine  Po tylenol   Norco 7.5/325 4. FEN  Diet as tolerated  KVO IVF 5. DVT/PE prophylaxis  Lovenox while inpt 6. Dispo  Therapies  HH  Probable d/c tomorrow or Sunday  Mearl Latin, PA-C [facility] [phone] (P) 04/05/2012 10:30 AM

## 2012-04-05 NOTE — Op Note (Signed)
NAMEMADDYX, VALLIE NO.:  [id number]  MEDICAL RECORD NO.:  [id number]  LOCATION:  5N31C                        FACILITY:  [facility]  PHYSICIAN:  Doralee Albino. Carola Frost, M.D. DATE OF BIRTH:  Tonya [name], Tonya [name]  DATE OF PROCEDURE:  04/04/2012 DATE OF DISCHARGE:                              OPERATIVE REPORT   POSTOPERATIVE DIAGNOSIS:  Left tibia nonunion.  POSTOPERATIVE DIAGNOSES: 1. Left tibia nonunion. 2. Bent tibial nail.  PROCEDURES: 1. Repair of nonunion left tibia using an iliac crest bone grafting. 2. Intramedullary nailing of the left tibia using a DePuy 10 x 345. 3. Removal of deep implant, left tibia.  SURGEON:  Doralee Albino. Carola Frost, MD  ASSISTANT:  Mearl Latin, PA  ANESTHESIA:  General.  COMPLICATIONS:  None.  SPECIMENS:  Two anaerobic, aerobic from reamings from the nonunion site of the tibia.  DISPOSITION:  To micro.  FINDINGS:  Moderate red cells, no organisms, no polys.  I/O:  2050 mL crystalloid.  EBL:  300 mL.  DISPOSITION:  To PACU.  CONDITION:  Stable.  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  It has now been nearly years since her initial accident.  Did discuss with her the risks and benefits of surgical repair including the options of allograft and infuse which she received at our index procedure, in addition to iliac crest bone grafting and the complications associated with that.  Total complications possible included a DVT, PE, persistent nonunion, infection, nerve injury, vessel injury, loss of motion, heart attack, stroke, need for further surgery, many others.  The patient understood these and did wish to proceed with iliac crest bone grafting and direct repair for tibial nonunion.  Furthermore,  after discussion she did wish to undergo reamed nailing with a 1 mm larger implant given that the stress upon the left leg is considerable with the amputation on the right.  Consequently, I also elected to undergo removal of the current implant and placement of another using new locations for her locking bolts and to get a fresh set of cycles on the hardware while the tibial nonunion underwent healing.  BRIEF SUMMARY OF PROCEDURE:  Mr. Patricelli did receive preoperative antibiotics, taken to the operating room where general anesthesia was induced.  Her left lower extremity was prepped and draped in usual sterile fashion.  The left iliac crest was prepped as well and a bump placed under her pelvis.  Still incisions were remade and dissection carried down to the bone.  This was quite difficult as the patient had developed severe keloid scar formation, and even at the distal tibia locking bolts there was an area of fibrous scar tissue in excess of 14 mm.  The heads of the screws were identified and removed.  Similarly at the top of the knee it was placed on a radiolucent triangle, brought into flexion and the tip of the nail identified with a large curette and then extraction bolt.  The nail  was withdrawn without difficulty.  It was bent.  The fracture nonunion site was opened as well.  Dissection carried down to this carefully peeled back.  The nonunion site was then probed with a 15 blade.  On the lateral side actually she had intact new bone, but there was some minimal amount of cantilever present.  The area of nonunion which was extensive medially was completely debrided such that she made considerable progress since her initial procedure but still lacked a complete union, largest length missing was approximately 1.5 cm.  Following use of multiple curettes and rongeur to thoroughly debride the fracture area the tibia was then reamed sequentially up to 10 mm.  The reamings were collected  at 10 and sent to micro.  The reaming continued up to 11.  As such, we placed a 10-mm nail to support the alignment of the tibia.  The attentions were then turned to the iliac crest where a 4-cm incision was made and carried down to a muscular plane to the crest trapdoor was made reflecting it proximally and then a __________ gouge used to harvest an extensive amount of graft.  This graft was placed along the posterolateral cortices of the nonunion and then the nail was passed.  Additional graph was placed anteriorly and medially including some cancellous allograft chips. Final images showed excellent filling of the defect and the nail was sunk past the old locking bolt sides and a fresh bone.  New incisions and screws were placed proximally and distally using static holes in all.  Final images were reviewed and without orthogonal planes.  The wounds were closed in standard layered fashion.  The micro report again did not demonstrate any evidence of infection which was consistent with the gross appearance.  Montez Morita, PA-C did assist me throughout and was absolutely necessary for the safe and effective completion of this case as he had to maintain alignment while I reamed and passed the nail as he facilitated exposure and is a help with simultaneous wound closure including retraction and closure of the iliac crest area.  PROGNOSIS:  Tonya [name] will be partial weightbearing on the left lower extremity for transfers.  She will advance to full weightbearing at 6 weeks and will have unrestricted range of motion beginning today both the ankle and the knee.  She remains at increased risk for persistent nonunion given the amount of iliac crest graft and well vascularized bone at the site of her nonunion site still optimistic that she will go on to unite at this time.     Doralee Albino. Carola Frost, M.D.     [facility]  D:  04/04/2012  T:  04/05/2012  Job:  [id number]

## 2012-04-05 NOTE — Progress Notes (Signed)
Rehab Admissions Coordinator Note:  Patient was screened by Meryl Dare for appropriateness for an Inpatient Acute Rehab Consult.  At this time, we are recommending Inpatient Rehab consult.  Meryl Dare 04/05/2012, 4:42 PM  I can be reached at [phone].

## 2012-04-05 NOTE — Evaluation (Signed)
Occupational Therapy Evaluation Patient Details Name: Tonya [name] MRN: [id number] DOB: August 03, 1985 Today's Date: 04/05/2012 Time: 4098-1191 OT Time Calculation (min): 65 min  OT Assessment / Plan / Recommendation Clinical Impression  Pt is a pleasant 27 yr old female admitted with nonunion left tibia.  She hunderwent ORIF via Dr. Carola Frost.  She also has a history of AKA on the right side.  Both are limiting her current independence with selfcare tasks and functional transfers.  Feel pt will benefit from acute care OT to help with increasing overall independence.  Pt lives alone and will need to be modified independent for most ADLs.  Feel she will benefit from CIR level therapies to achieve this as she currently needs variable min to max assist for toilet transfers and wheelchair transfers.    OT Assessment  Patient needs continued OT Services    Follow Up Recommendations  CIR    Barriers to Discharge Decreased caregiver support Pt lives alone  Equipment Recommendations  Tub/shower bench;Other (comment) (drop arm commode)    Recommendations for Other Services    Frequency  Min 2X/week    Precautions / Restrictions Precautions Precautions: Fall Required Braces or Orthoses: Other Brace/Splint Other Brace/Splint: Wears prosthesis on RLE, but has to stand on LLE to get prosthesis on by herself.  (cannot do this due to 50% PWB on LLE) Restrictions Weight Bearing Restrictions: Yes LLE Weight Bearing: Partial weight bearing LLE Partial Weight Bearing Percentage or Pounds: 50%   Pertinent Vitals/Pain HR 117 at rest increasing to 128 post transfer back to bed, O2 sats 95% on 3.5 Ls nasal cannulae    ADL  Eating/Feeding: Performed;Independent Grooming: Simulated;Set up Where Assessed - Grooming: Unsupported sitting Upper Body Bathing: Simulated;Set up Where Assessed - Upper Body Bathing: Unsupported sitting Lower Body Bathing: Simulated;Minimal assistance Where Assessed - Lower Body  Bathing: Unsupported sitting Upper Body Dressing: Simulated;Set up Where Assessed - Upper Body Dressing: Unsupported sitting Lower Body Dressing: Performed;Other (comment);Maximal assistance (including RLE prosthesis) Where Assessed - Lower Body Dressing: Supported sitting Toilet Transfer: Performed;Moderate assistance Toilet Transfer Method: Squat pivot Toilet Transfer Equipment: Drop arm bedside commode Toileting - Clothing Manipulation and Hygiene: Simulated;Moderate assistance Where Assessed - Toileting Clothing Manipulation and Hygiene: Lean right and/or left;Sit on 3-in-1 or toilet Tub/Shower Transfer Method: Not assessed Equipment Used: Other (comment) (RLE AKA prosthesis) Transfers/Ambulation Related to ADLs: Pt unable to ambulate secondary to PWBing status on the LLE and prosthesis on the right. ADL Comments: Pt lives alone and will only have her mother in town until 04/07/12.  Needs to be modified independent with all functional transfers and selfcare tasks.  Able to perform squat pivot transfers with mod assist  to the bedside commode and wheelchair but needed max assist to transfer from wheelchair to bed.  Feel she needs continued CIR level rehab to help problem solve and get further practice with the most independent techniqes for all selfcare tasks and functional transfers.      OT Diagnosis: Generalized weakness;Acute pain  OT Problem List: Decreased strength;Impaired balance (sitting and/or standing);Decreased knowledge of use of DME or AE;Pain OT Treatment Interventions: Self-care/ADL training;Therapeutic exercise;DME and/or AE instruction;Balance training;Patient/family education;Therapeutic activities   OT Goals Acute Rehab OT Goals OT Goal Formulation: With patient Time For Goal Achievement: 04/12/12 Potential to Achieve Goals: Good ADL Goals Pt Will Perform Lower Body Bathing: with supervision;Sitting, edge of bed;Other (comment) (lateral leans for peri care) ADL Goal:  Lower Body Bathing - Progress: Goal set today Pt Will Perform Lower  Body Dressing: with supervision;with adaptive equipment;Other (comment) (lateral leans for pulling underpants/pants over hips) ADL Goal: Lower Body Dressing - Progress: Goal set today Pt Will Transfer to Toilet: with supervision;Other (comment) (drop arm toilet, possible scoot pivot vs ant/post) ADL Goal: Toilet Transfer - Progress: Goal set today Pt Will Perform Toileting - Clothing Manipulation: with supervision;Other (comment) (lateral leans side to side) ADL Goal: Toileting - Clothing Manipulation - Progress: Goal set today  Visit Information  Last OT Received On: 04/05/12 Assistance Needed: +2    Subjective Data  Subjective: My mom will be with me until sunday but then I will be alone. Patient Stated Goal: Pt wants to get as independent as possible and back home.   Prior Functioning     Home Living Lives With: Alone Available Help at Discharge: Family (Mother here for 2 more days - then returning to [place] to work) Type of Home: Apartment Home Access: Level entry Home Layout: One level Bathroom Shower/Tub: Engineer, manufacturing systems: Standard Home Adaptive Equipment: Hand-held shower hose;Bedside commode/3-in-1;Tub transfer bench;Walker - rolling;Wheelchair - manual Prior Function Level of Independence: Independent with assistive device(s) Able to Take Stairs?: No Driving: No Vocation: On disability Communication Communication: No difficulties         Vision/Perception Vision - History Baseline Vision: No visual deficits Patient Visual Report: No change from baseline Vision - Assessment Eye Alignment: Within Functional Limits Vision Assessment: Vision not tested Perception Perception: Within Functional Limits Praxis Praxis: Intact   Cognition  Cognition Overall Cognitive Status: Appears within functional limits for tasks assessed/performed Arousal/Alertness: Awake/alert Orientation  Level: Appears intact for tasks assessed Behavior During Session: [facility] for tasks performed    Extremity/Trunk Assessment Right Upper Extremity Assessment RUE ROM/Strength/Tone: Within functional levels RUE Sensation: WFL - Light Touch RUE Coordination: WFL - gross/fine motor Left Upper Extremity Assessment LUE ROM/Strength/Tone: Within functional levels LUE Sensation: WFL - Light Touch LUE Coordination: WFL - gross/fine motor Right Lower Extremity Assessment RLE ROM/Strength/Tone: WFL for tasks assessed (Hip flex/ext WFL - AKA) RLE Sensation: WFL - Light Touch Left Lower Extremity Assessment LLE ROM/Strength/Tone: Deficits;Unable to fully assess;Due to pain LLE ROM/Strength/Tone Deficits: Able to assist with moving LLE on bed to get into position for AP transfer. Trunk Assessment Trunk Assessment: Normal     Mobility Bed Mobility Bed Mobility: Sit to Supine Supine to Sit: 5: Supervision;HOB flat Sitting - Scoot to Edge of Bed: 3: Mod assist;With rail Details for Bed Mobility Assistance: Verbal cues for technique.  Required mod assist to move/position LLE. Transfers Transfers: Not assessed Details for Transfer Assistance: Pt unable to stand secondary to PWBing and having AKA on the right lower extremtiy.        Balance Balance Balance Assessed: Yes Static Sitting Balance Static Sitting - Balance Support: No upper extremity supported Static Sitting - Level of Assistance: 5: Stand by assistance Static Sitting - Comment/# of Minutes: 6 minutes with good balance. Dynamic Sitting Balance Dynamic Sitting - Balance Support: No upper extremity supported Dynamic Sitting - Level of Assistance: 5: Stand by assistance   End of Session OT - End of Session Equipment Utilized During Treatment: Right lower extremity prosthesis Activity Tolerance: Patient tolerated treatment well Patient left: in bed;with nursing in room Nurse Communication: Mobility status     MCGUIRE,JAMES  OTR/L Pager number [phone] 04/05/2012, 4:40 PM

## 2012-04-05 NOTE — Evaluation (Signed)
Physical Therapy Evaluation Patient Details Name: Tonya [name] MRN: [id number] DOB: 1985-09-01 Today's Date: 04/05/2012 Time: 7829-5621 PT Time Calculation (min): 30 min  PT Assessment / Plan / Recommendation Clinical Impression  Patient is a 27 yo female s/p IM nailing of Lt. non-union tibial fx.  Patient with h/o Rt. AKA with prosthesis.  Patient lives alone. To don her prosthesis, patient has to stand on LLE - is now 50% PWB LLE.  Will not be able to don prosthesis on her own.  Will therefore need to transfer anterior/posterior in and out of wheelchair.  This transfer today required +2 assistance.  Do not feel patient can manage at home alone at this time.  Recommend Inpatient Rehab consult to maximize patient's independence prior to return home.  Will benefit from acute PT for mobility training prior to discharge.    PT Assessment  Patient needs continued PT services    Follow Up Recommendations  CIR    Does the patient have the potential to tolerate intense rehabilitation      Barriers to Discharge Decreased caregiver support Patient lives alone.  Mother here now, but will be returning to [place] in 2 days to return to work.    Equipment Recommendations  None recommended by PT    Recommendations for Other Services Rehab consult   Frequency Min 4X/week    Precautions / Restrictions Precautions Precautions: Fall Required Braces or Orthoses: Other Brace/Splint Other Brace/Splint: Wears prosthesis on RLE, but has to stand on LLE to get prosthesis on by herself.  (cannot do this due to 50% PWB on LLE) Restrictions Weight Bearing Restrictions: Yes LLE Weight Bearing: Partial weight bearing LLE Partial Weight Bearing Percentage or Pounds: 50%   Pertinent Vitals/Pain Pain limiting mobility today      Mobility  Bed Mobility Bed Mobility: Sit to Supine Supine to Sit: 5: Supervision;HOB flat Sitting - Scoot to Edge of Bed: 3: Mod assist;With rail Details for Bed Mobility  Assistance: Verbal cues for technique.  Required mod assist to move/position LLE. Transfers Transfers: Risk manager: 1: +2 Total assist;To level surface Anterior-Posterior Transfers: Patient Percentage: 70% Details for Transfer Assistance: Pt unable to stand secondary to PWBing and having AKA on the right lower extremtiy. Ambulation/Gait Ambulation/Gait Assistance: Not tested (comment)    Exercises     PT Diagnosis: Difficulty walking;Acute pain;Generalized weakness  PT Problem List: Decreased strength;Decreased activity tolerance;Decreased mobility;Cardiopulmonary status limiting activity;Obesity;Pain PT Treatment Interventions: DME instruction;Functional mobility training;Therapeutic exercise;Patient/family education;Wheelchair mobility training   PT Goals Acute Rehab PT Goals PT Goal Formulation: With patient Time For Goal Achievement: 04/12/12 Potential to Achieve Goals: Good Pt will go Supine/Side to Sit: with modified independence;with HOB 0 degrees;with rail PT Goal: Supine/Side to Sit - Progress: Goal set today Pt will go Sit to Supine/Side: with modified independence;with HOB 0 degrees;with rail PT Goal: Sit to Supine/Side - Progress: Goal set today Pt will Transfer Bed to Chair/Chair to Bed: with min assist PT Transfer Goal: Bed to Chair/Chair to Bed - Progress: Goal set today  Visit Information  Last PT Received On: 04/05/12 Assistance Needed: +2    Subjective Data  Subjective: Patient reports she feels like she has a fever - RN called to room.   Patient Stated Goal: To be able to get around in my wheelchair.   Prior Functioning  Home Living Lives With: Alone Available Help at Discharge: Family (Mother here for 2 more days - then returning to [place] to work) Type of Home: Apartment  Home Access: Level entry Home Layout: One level Bathroom Shower/Tub: Engineer, manufacturing systems: Standard Home Adaptive Equipment:  Hand-held shower hose;Bedside commode/3-in-1;Tub transfer bench;Walker - rolling;Wheelchair - manual Prior Function Level of Independence: Independent with assistive device(s) Able to Take Stairs?: No Driving: No Vocation: On disability Communication Communication: No difficulties    Cognition  Cognition Overall Cognitive Status: Appears within functional limits for tasks assessed/performed Arousal/Alertness: Awake/alert Orientation Level: Appears intact for tasks assessed Behavior During Session: [facility] for tasks performed    Extremity/Trunk Assessment Right Upper Extremity Assessment RUE ROM/Strength/Tone: Within functional levels RUE Sensation: WFL - Light Touch RUE Coordination: WFL - gross/fine motor Left Upper Extremity Assessment LUE ROM/Strength/Tone: Within functional levels LUE Sensation: WFL - Light Touch LUE Coordination: WFL - gross/fine motor Right Lower Extremity Assessment RLE ROM/Strength/Tone: WFL for tasks assessed (Hip flex/ext WFL - AKA) RLE Sensation: WFL - Light Touch Left Lower Extremity Assessment LLE ROM/Strength/Tone: Deficits;Unable to fully assess;Due to pain LLE ROM/Strength/Tone Deficits: Able to assist with moving LLE on bed to get into position for AP transfer. Trunk Assessment Trunk Assessment: Normal   Balance Balance Balance Assessed: Yes Static Sitting Balance Static Sitting - Balance Support: No upper extremity supported Static Sitting - Level of Assistance: 5: Stand by assistance Static Sitting - Comment/# of Minutes: 6 minutes with good balance. Dynamic Sitting Balance Dynamic Sitting - Balance Support: No upper extremity supported Dynamic Sitting - Level of Assistance: 5: Stand by assistance  End of Session PT - End of Session Equipment Utilized During Treatment: Gait belt;Oxygen Activity Tolerance: Patient limited by pain;Patient limited by fatigue Patient left: in chair;with call bell/phone within reach;with nursing in room Nurse  Communication: Mobility status;Weight bearing status  GP     Vena Austria 04/05/2012, 4:33 PM  [name]. Renaldo Fiddler, [facility] Acute Rehab Services Pager [phone]

## 2012-04-05 NOTE — Progress Notes (Signed)
UR COMPLETED  

## 2012-04-05 NOTE — Progress Notes (Signed)
CARE MANAGEMENT NOTE 04/05/2012  Patient:  Tonya [name], Tonya [name]   Account Number:  [id number]  Date Initiated:  04/05/2012  Documentation initiated by:  Vance Peper  Subjective/Objective Assessment:   27 yr old female s/p left tibia IM Nailing with hardware removal.     Action/Plan:   CM spoke with patient concerning home health and DME. CM will follow.  CIR to eval patient. She lives alane, has right AKA from 2013.   Anticipated DC Date:     Anticipated DC Plan:           Choice offered to / List presented to:             Status of service:  In process, will continue to follow
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
## 2012-04-06 NOTE — Progress Notes (Signed)
Seen in  Room 5N31  Afebrile vital signs stable  S: says she would like to go to rehab - suggested by PT, apparently not able to function well at home at this point  O: sitting comfortably in bed  A: s/p AK amp'n right leg, s/p #'s and ORIF left LE  P: will put in acute rehab consult

## 2012-04-06 NOTE — Progress Notes (Signed)
Physical Therapy Treatment Patient Details Name: Tonya [name] MRN: [id number] DOB: 05/25/1985 Today's Date: 04/06/2012 Time: 4098-1191 PT Time Calculation (min): 32 min  PT Assessment / Plan / Recommendation Comments on Treatment Session  Pt making progress toward her PT goals today. Still would benefit from CIR stay to maximize her functional independence before going home alone.    Follow Up Recommendations  CIR           Equipment Recommendations  None recommended by PT    Recommendations for Other Services Rehab consult  Frequency Min 4X/week   Plan Discharge plan remains appropriate;Frequency remains appropriate    Precautions / Restrictions Precautions Precautions: Fall Other Brace/Splint: Pt has a prosthesis (C-leg) for RLE, needs to stand in order to fully seat herself into it. Currently needs mod assist to secure prosthesis in standing due to needing both hands on walker to maintain 50% PWB on LLE. Was independent with donning prosthesis prior to surgery. Restrictions LLE Weight Bearing: Partial weight bearing LLE Partial Weight Bearing Percentage or Pounds: 50%       Mobility  Bed Mobility Supine to Sit: 5: Supervision;HOB flat Sitting - Scoot to Edge of Bed: 4: Min assist Details for Bed Mobility Assistance: cues for technique.  min assist to support her LLE with scooting to edge of bed. Transfers Transfers: Sit to Stand;Stand to Sit;Stand Pivot Transfers Sit to Stand: 4: Min assist;With upper extremity assist;From bed;From chair/3-in-1;With armrests Stand to Sit: 4: Min assist;To chair/3-in-1;With upper extremity assist;With armrests Stand Pivot Transfers: 4: Min assist Details for Transfer Assistance: pt able to use walker to hold self up with 50% PWB while therapist assisted with sliding prosthesis back into place. Once prosthesis under pt she was able to wt bear through it to fully sink into socket. Returned to sitting afterwards. on second stand used walker to  stand pivot to bsc and then used walker to stand pivot to recliner. Once pt's had her prosthesis on she is able to bend the knee of it to postion the prosthetic foot under her and with use of arms on walker (therapist holding walker) pt can stand and maintain her wt bearing restriction on the left LE.                      Ambulation/Gait Ambulation/Gait Assistance: Not tested (comment)      PT Goals Acute Rehab PT Goals PT Goal: Supine/Side to Sit - Progress: Progressing toward goal PT Goal: Sit to Supine/Side - Progress: Progressing toward goal PT Transfer Goal: Bed to Chair/Chair to Bed - Progress: Progressing toward goal  Visit Information  Last PT Received On: 04/06/12 Assistance Needed: +1    Subjective Data  Subjective: No new complaints, agreeable to therapy at this time.   Cognition  Cognition Overall Cognitive Status: Appears within functional limits for tasks assessed/performed Arousal/Alertness: Awake/alert Orientation Level: Appears intact for tasks assessed Behavior During Session: [facility] for tasks performed       End of Session PT - End of Session Equipment Utilized During Treatment: Gait belt;Oxygen Activity Tolerance: Patient tolerated treatment well Patient left: in chair;with call bell/phone within reach Nurse Communication: Mobility status;Weight bearing status   GP     Sallyanne Kuster 04/06/2012, 3:43 PM  Sallyanne Kuster, PTA Office- [phone]

## 2012-04-07 LAB — TISSUE CULTURE

## 2012-04-07 MED ORDER — CHLORHEXIDINE GLUCONATE CLOTH 2 % EX PADS
6.0000 | MEDICATED_PAD | Freq: Every day | CUTANEOUS | Status: DC
  Administered 2012-04-07 – 2012-04-10 (×3): 6 via TOPICAL

## 2012-04-07 MED ORDER — MUPIROCIN 2 % EX OINT
1.0000 "application " | TOPICAL_OINTMENT | Freq: Two times a day (BID) | CUTANEOUS | Status: DC
  Administered 2012-04-07 – 2012-04-10 (×6): 1 via NASAL
  Filled 2012-04-07 (×2): qty 22

## 2012-04-07 NOTE — Progress Notes (Signed)
Seen in room 5N31  Afebrile vital signs stable  No change from yesterday.  Acut rebab consult pending

## 2012-04-08 ENCOUNTER — Encounter (HOSPITAL_COMMUNITY): Payer: Self-pay | Admitting: Orthopedic Surgery

## 2012-04-08 DIAGNOSIS — IMO0002 Reserved for concepts with insufficient information to code with codable children: Secondary | ICD-10-CM

## 2012-04-08 DIAGNOSIS — S82209A Unspecified fracture of shaft of unspecified tibia, initial encounter for closed fracture: Secondary | ICD-10-CM

## 2012-04-08 LAB — CBC
HCT: 28.2 % — ABNORMAL LOW (ref 36.0–46.0)
MCV: 84.7 fL (ref 78.0–100.0)
RBC: 3.33 MIL/uL — ABNORMAL LOW (ref 3.87–5.11)
WBC: 7.6 10*3/uL (ref 4.0–10.5)

## 2012-04-08 LAB — BASIC METABOLIC PANEL
BUN: 8 mg/dL (ref 6–23)
CO2: 30 mEq/L (ref 19–32)
Chloride: 104 mEq/L (ref 96–112)
Creatinine, Ser: 0.61 mg/dL (ref 0.50–1.10)
GFR calc Af Amer: >90 mL/min (ref >90)
Potassium: 3.8 mEq/L (ref 3.5–5.1)

## 2012-04-08 MED ORDER — HYDROCODONE-ACETAMINOPHEN 5-325 MG PO TABS: 1.0000 | ORAL_TABLET | Freq: Four times a day (QID) | ORAL | Status: DC | PRN

## 2012-04-08 NOTE — Progress Notes (Signed)
Orthopaedic Trauma Service Progress Note  Subjective Doing okay  Patient sleeping Not really cooperative with exam this morning Breakfast sitting in bedside  Objective BP 123/91  Pulse 94  Temp(Src) 98.2 F (36.8 C) (Oral)  Resp 18  Ht 5\' 5"  (1.651 m)  Wt 131.09 kg (289 lb)  BMI 48.09 kg/m2  SpO2 97%  LMP 01/18/2012  Patient Vitals for the past 24 hrs:  BP Temp Temp src Pulse Resp SpO2  04/08/12 0705 123/91 mmHg 98.2 F (36.8 C) - 94 18 97 %  04/07/12 2358 120/57 mmHg 98 F (36.7 C) - 90 18 100 %  04/07/12 1737 - - - - - 100 %  04/07/12 1447 127/81 mmHg 98 F (36.7 C) Oral 95 20 97 %   Intake/Output     03/09 0701 - 03/10 0700 03/10 0701 - 03/11 0700   P.O. 960 120   Total Intake(mL/kg) 960 (7.3) 120 (0.9)   Total Output 0     Net +960 +120        Urine Occurrence 5 x 1 x     Gen: pt sleeping but appears comfortable Lungs: clear Cardiac: s1 and s2 Left Leg:   Dressings c/d/i  Motor and sensory functions intact  Ext warm  + DP pulse  Assessment and plan  27 y/o female s/p repair of L tibial shaft nonunion POD                1. L tibial shaft nonunion POD 4             PWB, 50%             ROM as tolerated             Therapies as tolerated             Ice and elevate             Dressing changes prn 2. BPD             Continue home meds 3. Pain           d/c IV pain meds             Po tylenol               Norco 5/325  4. FEN             Diet as tolerated             nsl IVF  Recheck bmet   Wean O2   5. DVT/PE prophylaxis             Lovenox while inpt  6. Dispo            Therapies           CIR consult  Stable for d/c to CIR if bed available and accepted    Mearl Latin, PA-C [facility] [phone] (P) 04/08/2012 8:28 AM

## 2012-04-08 NOTE — Progress Notes (Signed)
OT Cancellation Note  Patient Details Name: Tonya [name] MRN: [id number] DOB: [birth year]   Cancelled Treatment:    Reason Eval/Treat Not Completed: Other (comment) (Pt declined OT tx this afternoon). Pt states that she is supposed to be going to CIR and that she is ready to go and tired of being on this floor  Galen Manila, OT 04/08/2012, 3:18 PM

## 2012-04-08 NOTE — Progress Notes (Signed)
Pt would like sleep study done if possible.States has had snoring and sleep apnea problem for several yrs.but does not use 02 at home.Has been on 02  3.5l n/c for sev days thank you. Linward Headland D
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
## 2012-04-08 NOTE — Progress Notes (Signed)
Physical Therapy Treatment Patient Details Name: Tonya [name] MRN: [id number] DOB: [birth year] Today's Date: 04/08/2012 Time: 4098-1191 PT Time Calculation (min): 27 min  PT Assessment / Plan / Recommendation Comments on Treatment Session  pt rpesents with L Tibia nonunion and hx R AKA with prosthetic.  pt motivated to work on mobility and to go to rehab to continue therapy.  Still feel pt would be great candidate for CIR.      Follow Up Recommendations  CIR     Does the patient have the potential to tolerate intense rehabilitation     Barriers to Discharge        Equipment Recommendations  None recommended by PT    Recommendations for Other Services Rehab consult  Frequency Min 4X/week   Plan Discharge plan remains appropriate;Frequency remains appropriate    Precautions / Restrictions Precautions Precautions: Fall Other Brace/Splint: Pt has a prosthesis (C-leg) for RLE, needs to stand in order to fully seat herself into it. Currently needs mod assist to secure prosthesis in standing due to needing both hands on walker to maintain 50% PWB on LLE. Was independent with donning prosthesis prior to surgery. Restrictions Weight Bearing Restrictions: Yes LLE Weight Bearing: Partial weight bearing LLE Partial Weight Bearing Percentage or Pounds: 50%   Pertinent Vitals/Pain No c/o pain.      Mobility  Bed Mobility Bed Mobility: Supine to Sit;Sitting - Scoot to Edge of Bed Supine to Sit: 5: Supervision;HOB flat Sitting - Scoot to Edge of Bed: 5: Supervision Details for Bed Mobility Assistance: pt able to use UEs to move L LE.   Transfers Transfers: Sit to Stand;Stand to Sit;Anterior-Posterior Transfer Sit to Stand: 3: Mod assist;With upper extremity assist;From bed Stand to Sit: 4: Min assist;With upper extremity assist;To chair/3-in-1;With armrests Anterior-Posterior Transfer: 4: Min assist;To level surface Details for Transfer Assistance: pt able to perform A-P transfer on/off  elongated 3-in-1.  Demos good technique.  2nd person present for sit to stand and to seat R LE in prosthetic while standing.  Cues to control descent to chair.   Ambulation/Gait Ambulation/Gait Assistance: 4: Min assist Ambulation Distance (Feet): 2 Feet Assistive device: Rolling walker Ambulation/Gait Assistance Details: pt able to take 2 steps with RW and C-leg on R LE.  pt indicates she felt like she was starting to put too much weight on her L LE so pt pivoted to chair.  Minimal cueing for technique.  2nd person present for safety.   Gait Pattern: Step-through pattern;Decreased step length - left Stairs: No Wheelchair Mobility Wheelchair Mobility: No    Exercises     PT Diagnosis:    PT Problem List:   PT Treatment Interventions:     PT Goals Acute Rehab PT Goals Time For Goal Achievement: 04/12/12 Potential to Achieve Goals: Good PT Goal: Supine/Side to Sit - Progress: Progressing toward goal Pt will go Sit to Stand: with supervision;with upper extremity assist PT Goal: Sit to Stand - Progress: Goal set today Pt will go Stand to Sit: with supervision;with upper extremity assist PT Goal: Stand to Sit - Progress: Goal set today PT Transfer Goal: Bed to Chair/Chair to Bed - Progress: Progressing toward goal Pt will Ambulate: 16 - 50 feet;with min assist;with rolling walker PT Goal: Ambulate - Progress: Goal set today  Visit Information  Last PT Received On: 04/08/12 Assistance Needed: +2 (2nd person helpful for safety)    Subjective Data  Subjective: pt anxious to go to rehab.     Cognition  Cognition  Overall Cognitive Status: Appears within functional limits for tasks assessed/performed Arousal/Alertness: Awake/alert Orientation Level: Appears intact for tasks assessed Behavior During Session: [facility] for tasks performed    Balance  Balance Balance Assessed: No  End of Session PT - End of Session Equipment Utilized During Treatment: Gait belt;Oxygen Activity Tolerance:  Patient tolerated treatment well Patient left: in chair;with call bell/phone within reach Nurse Communication: Mobility status;Weight bearing status   GP     Sunny Schlein, [place] [phone] 04/08/2012, 9:54 AM

## 2012-04-08 NOTE — Progress Notes (Signed)
CARE MANAGEMENT NOTE 04/08/2012  Patient:  Tonya [name], Tonya [name]   Account Number:  [id number]  Date Initiated:  04/05/2012  Documentation initiated by:  Vance Peper  Subjective/Objective Assessment:   27 yr old female s/p left tibia IM Nailing with hardware removal.     Action/Plan:   CM spoke with patient concerning home health and DME. CM will follow.  CIR to eval patient. She lives alane, has right AKA from 2013.   Anticipated DC Date:  04/09/2012   Anticipated DC Plan:  IP REHAB FACILITY      DC Planning Services  CM consult      Choice offered to / List presented to:             Status of service:  Completed, signed off Medicare Important Message given?   (If response is "NO", the following Medicare IM given date fields will be blank) Date Medicare IM given:   Date Additional Medicare IM given:    Discharge Disposition:  IP REHAB FACILITY  Per UR Regulation:    If discussed at Long Length of Stay Meetings, dates discussed:    Comments:

## 2012-04-09 ENCOUNTER — Encounter: Payer: Medicare Other | Admitting: Physical Therapy

## 2012-04-09 LAB — URINALYSIS, ROUTINE W REFLEX MICROSCOPIC
Bilirubin Urine: NEGATIVE
Glucose, UA: NEGATIVE mg/dL
Ketones, ur: NEGATIVE mg/dL
Nitrite: NEGATIVE
Specific Gravity, Urine: 1.025 (ref 1.005–1.030)
pH: 7 (ref 5.0–8.0)

## 2012-04-09 LAB — ANAEROBIC CULTURE

## 2012-04-09 LAB — URINE MICROSCOPIC-ADD ON

## 2012-04-09 MED ORDER — LORATADINE 10 MG PO TABS
10.0000 mg | ORAL_TABLET | Freq: Every day | ORAL | Status: DC
  Administered 2012-04-09 – 2012-04-10 (×2): 10 mg via ORAL
  Filled 2012-04-09 (×2): qty 1

## 2012-04-09 NOTE — Progress Notes (Signed)
Rehab admissions - Evaluated for possible admission.  Dr. Riley Kill accepted patient for acute inpatient rehab yesterday in consult.  However, no rehab beds available late yesterday or today.  Very limited bed availability this week.  I have informed case manager and social worker that alternative plan may be needed due to limited rehab beds.  Call me for questions.  #[phone]

## 2012-04-09 NOTE — Progress Notes (Signed)
Seen and agreed 04/09/2012 Kimberlyann Hollar Elizabeth PTA [phone] pager [phone] office    

## 2012-04-09 NOTE — Progress Notes (Signed)
Physical Therapy Treatment Patient Details Name: Tonya [name] MRN: [id number] DOB: Apr 16, 1985 Today's Date: 04/09/2012 Time: 9562-1308 PT Time Calculation (min): 25 min  PT Assessment / Plan / Recommendation Comments on Treatment Session  Pt indicated she was ready to get up and walk this session but unable to ambulate today due to difficulty fitting prosthetic properly.  Likely due to swelling in R leg.  Still recommending CIR consult as patient lives alone and is very motivated and wanting to progress to walking again. Believe patient can acheive Mod I level with CIR.     Follow Up Recommendations  CIR     Does the patient have the potential to tolerate intense rehabilitation     Barriers to Discharge        Equipment Recommendations  None recommended by PT    Recommendations for Other Services Rehab consult  Frequency Min 4X/week   Plan Discharge plan remains appropriate;Frequency remains appropriate    Precautions / Restrictions Precautions Precautions: Fall Other Brace/Splint: Pt has a prosthesis (C-leg) for RLE, needs to stand in order to fully seat herself into it. Currently needs mod assist to secure prosthesis in standing due to needing both hands on walker to maintain 50% PWB on LLE. Was independent with donning prosthesis prior to surgery. Restrictions Weight Bearing Restrictions: Yes LLE Weight Bearing: Partial weight bearing LLE Partial Weight Bearing Percentage or Pounds: 50   Pertinent Vitals/Pain Pt denies pain.    Mobility  Bed Mobility Bed Mobility: Supine to Sit;Sitting - Scoot to Edge of Bed Supine to Sit: 5: Supervision;HOB flat Sitting - Scoot to Edge of Bed: 5: Supervision Details for Bed Mobility Assistance: pt able to use UEs to move L LE.   Transfers Transfers: Squat Pivot Transfers Sit to Stand: 1: +2 Total assist;With upper extremity assist;From bed Sit to Stand: Patient Percentage: 70% Stand to Sit: 4: Min assist;With upper extremity  assist;To chair/3-in-1;To bed Squat Pivot Transfers: 1: +2 Total assist Squat Pivot Transfers: Patient Percentage: 70% Details for Transfer Assistance: Pt had more difficulty with transfers today due to prosthetic not fitting into position properly.  Pt indicates likely due to leg swelling since she was not wearing sock as she typically does.   Ambulation/Gait Stairs: No Wheelchair Mobility Wheelchair Mobility: No    Exercises     PT Diagnosis:    PT Problem List:   PT Treatment Interventions:     PT Goals Acute Rehab PT Goals Time For Goal Achievement: 04/12/12 Potential to Achieve Goals: Good PT Goal: Supine/Side to Sit - Progress: Progressing toward goal Pt will go Sit to Stand: with supervision;with upper extremity assist PT Goal: Sit to Stand - Progress: Progressing toward goal Pt will go Stand to Sit: with supervision;with upper extremity assist PT Goal: Stand to Sit - Progress: Progressing toward goal Pt will Transfer Bed to Chair/Chair to Bed: with min assist PT Transfer Goal: Bed to Chair/Chair to Bed - Progress: Progressing toward goal  Visit Information  Last PT Received On: 04/09/12 Assistance Needed: +2    Subjective Data  Subjective: Pt denies pain.  Pt would like to get up and walk this session.   Cognition  Cognition Overall Cognitive Status: Appears within functional limits for tasks assessed/performed Arousal/Alertness: Awake/alert Orientation Level: Appears intact for tasks assessed Behavior During Session: [facility] for tasks performed    Balance     End of Session PT - End of Session Equipment Utilized During Treatment: Gait belt Activity Tolerance: Patient tolerated treatment well Patient  left: in chair;with call bell/phone within reach Nurse Communication: Mobility status   GP     Enid Baas, SPTA 04/09/2012, 3:11 PM

## 2012-04-09 NOTE — Progress Notes (Signed)
[facility] Progress Note  Subjective  Pt doing ok this am No complaints Ready to go to CIR   Objective  BP 137/72  Pulse 96  Temp(Src) 97.9 F (36.6 C) (Oral)  Resp 16  Ht 5\' 5"  (1.651 m)  Wt 131.09 kg (289 lb)  BMI 48.09 kg/m2  SpO2 92%  LMP 01/18/2012  Patient Vitals for the past 24 hrs:  BP Temp Pulse Resp SpO2  04/09/12 0624 137/72 mmHg 97.9 F (36.6 C) 96 16 92 %  04/08/12 2234 126/73 mmHg 98 F (36.7 C) 110 16 92 %  04/08/12 1600 134/74 mmHg 98.3 F (36.8 C) 111 16 99 %    Intake/Output     03/10 0701 - 03/11 0700 03/11 0701 - 03/12 0700   P.O. 600    Total Intake(mL/kg) 600 (4.6)    Total Output       Net +600          Urine Occurrence 9 x    Stool Occurrence 1 x     Labs  Results for SHEIKA, COUTTS (MRN [id number]) as of 04/09/2012 09:46  Ref. Range 04/08/2012 11:30  Sodium Latest Range: 135-145 mEq/L 141  Potassium Latest Range: 3.5-5.1 mEq/L 3.8  Chloride Latest Range: 96-112 mEq/L 104  CO2 Latest Range: 19-32 mEq/L 30  BUN Latest Range: 6-23 mg/dL 8  Creatinine Latest Range: 0.50-1.10 mg/dL 4.09  Calcium Latest Range: 8.4-10.5 mg/dL 8.9  GFR calc non Af Amer Latest Range: >90 mL/min >90  GFR calc Af Amer Latest Range: >90 mL/min >90  Glucose Latest Range: 70-99 mg/dL 811 (H)  WBC Latest Range: 4.0-10.5 K/uL 7.6  RBC Latest Range: 3.87-5.11 MIL/uL 3.33 (L)  Hemoglobin Latest Range: 12.0-15.0 g/dL 9.4 (L)  HCT Latest Range: 36.0-46.0 % 28.2 (L)  MCV Latest Range: 78.0-100.0 fL 84.7  MCH Latest Range: 26.0-34.0 pg 28.2  MCHC Latest Range: 30.0-36.0 g/dL 91.4  RDW Latest Range: 11.5-15.5 % 13.7  Platelets Latest Range: 150-400 K/uL 222    Exam  Gen: awake and alert, sitting up in bed Lungs: clear B Cardiac: reg, s1 and s2 Abd: + BS, NT Ext:      Left Lower Extremity  Wounds look fantastic  No signs of infection   Swelling controlled  Ext warm  + DP pulse  Distal motor and sensory functions  intact     Assessment and Plan  27 y/o female s/p repair of L tibial shaft nonunion POD                 1. L tibial shaft nonunion POD 5             PWB, 50%             ROM as tolerated             Therapies as tolerated             Ice and elevate             Dressing changes prn 2. BPD             Continue home meds 3. Pain                        Po tylenol               Norco 5/325     4. FEN  Diet as tolerated             nsl IVF        5. DVT/PE prophylaxis             Lovenox while inpt continue for 9 more days (14 days post op total)  6. Dispo            Therapies           CIR consult             Stable for d/c to CIR if bed available and accepted   Mearl Latin, PA-C [facility] [phone] (P) 04/09/2012 9:49 AM

## 2012-04-10 MED ORDER — PSEUDOEPHEDRINE HCL 60 MG PO TABS
60.0000 mg | ORAL_TABLET | Freq: Three times a day (TID) | ORAL | Status: DC | PRN
  Filled 2012-04-10: qty 1

## 2012-04-10 NOTE — Progress Notes (Signed)
[facility] Progress Note  Subjective  Doing great Wants to go home today if no CIR bed available  Objective  BP 129/71  Pulse 103  Temp(Src) 97.4 F (36.3 C) (Oral)  Resp 18  Ht 5\' 5"  (1.651 m)  Wt 131.09 kg (289 lb)  BMI 48.09 kg/m2  SpO2 93%  LMP 01/18/2012  Patient Vitals for the past 24 hrs:  BP Temp Temp src Pulse Resp SpO2  04/10/12 0929 129/71 mmHg 97.4 F (36.3 C) Oral 103 18 93 %  04/10/12 0609 122/67 mmHg 97.9 F (36.6 C) - 86 16 94 %  04/09/12 1400 114/97 mmHg 97.3 F (36.3 C) - 113 18 94 %    Intake/Output     03/11 0701 - 03/12 0700 03/12 0701 - 03/13 0700   P.O.     Total Intake(mL/kg)     Net            Urine Occurrence 1 x      Exam  Gen: Awake and alert, NAD Lungs:  clear Cardiac: reg Abd: + BS Ext:      Left Lower Extremity        Wounds look fantastic             No signs of infection               Swelling controlled             Ext warm             + DP pulse             Distal motor and sensory functions intact  TED hose in place        Assessment and Plan  27 y/o female s/p repair of L tibial shaft nonunion POD                 1. L tibial shaft nonunion POD 6             WBAT L leg             ROM as tolerated             Therapies as tolerated             Ice and elevate             Dressing changes prn 2. BPD             Continue home meds 3. Pain                        Po tylenol               Norco 5/325     4. FEN             Diet as tolerated             nsl IVF        5. DVT/PE prophylaxis             Lovenox while inpt continue for 9 more days (14 days post op total)  6. Dispo  D/c home today with HH  F/u in 10-14 days  Mearl Latin, PA-C [facility] [phone] (P) 04/10/2012 11:48 AM

## 2012-04-10 NOTE — Progress Notes (Signed)
Pt watched Lovenox video instructed on giving inj but refused to stick self while in hospital.
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
## 2012-04-11 ENCOUNTER — Encounter: Payer: Medicare Other | Admitting: Physical Therapy

## 2012-04-11 LAB — URINE CULTURE: Colony Count: 15000

## 2012-04-16 ENCOUNTER — Encounter: Payer: Medicare Other | Admitting: Physical Therapy

## 2012-04-17 NOTE — Progress Notes (Signed)
Late Entry: PT is recommending home with CIR and not SNF. CSW will make CM aware. Clinical Social Worker will sign off for now as social work intervention is no longer needed. Please consult [place] again if new need arises.   Sabino Niemann, MSW [phone]

## 2012-04-18 ENCOUNTER — Ambulatory Visit: Payer: Medicare Other | Admitting: Physical Therapy

## 2012-04-23 ENCOUNTER — Encounter: Payer: Medicare Other | Admitting: Physical Therapy

## 2012-04-25 ENCOUNTER — Encounter: Payer: Medicare Other | Admitting: Physical Therapy

## 2012-04-30 ENCOUNTER — Ambulatory Visit: Payer: Medicare Other | Attending: Internal Medicine | Admitting: Physical Therapy

## 2012-04-30 DIAGNOSIS — R269 Unspecified abnormalities of gait and mobility: Secondary | ICD-10-CM | POA: Insufficient documentation

## 2012-04-30 DIAGNOSIS — M6281 Muscle weakness (generalized): Secondary | ICD-10-CM | POA: Insufficient documentation

## 2012-04-30 DIAGNOSIS — R5381 Other malaise: Secondary | ICD-10-CM | POA: Insufficient documentation

## 2012-04-30 DIAGNOSIS — IMO0001 Reserved for inherently not codable concepts without codable children: Secondary | ICD-10-CM | POA: Insufficient documentation

## 2012-04-30 DIAGNOSIS — S88119A Complete traumatic amputation at level between knee and ankle, unspecified lower leg, initial encounter: Secondary | ICD-10-CM | POA: Insufficient documentation

## 2012-05-02 ENCOUNTER — Ambulatory Visit: Payer: Medicare Other | Admitting: Physical Therapy

## 2012-05-07 ENCOUNTER — Ambulatory Visit: Payer: Medicare Other | Admitting: Physical Therapy

## 2012-05-09 ENCOUNTER — Ambulatory Visit: Payer: Medicare Other | Admitting: Physical Therapy

## 2012-05-14 ENCOUNTER — Ambulatory Visit: Payer: Medicare Other | Admitting: Physical Therapy

## 2012-05-16 ENCOUNTER — Ambulatory Visit: Payer: Medicare Other | Admitting: Physical Therapy

## 2012-05-21 ENCOUNTER — Ambulatory Visit: Payer: Medicare Other | Admitting: Physical Therapy

## 2012-05-23 ENCOUNTER — Ambulatory Visit: Payer: Medicare Other | Admitting: Physical Therapy

## 2012-05-28 ENCOUNTER — Ambulatory Visit: Payer: Medicare Other | Admitting: Physical Therapy

## 2012-05-30 ENCOUNTER — Encounter: Payer: Medicare Other | Admitting: Physical Therapy

## 2012-05-31 ENCOUNTER — Ambulatory Visit: Payer: Medicare Other | Attending: Internal Medicine | Admitting: Physical Therapy

## 2012-05-31 DIAGNOSIS — R269 Unspecified abnormalities of gait and mobility: Secondary | ICD-10-CM | POA: Insufficient documentation

## 2012-05-31 DIAGNOSIS — M6281 Muscle weakness (generalized): Secondary | ICD-10-CM | POA: Insufficient documentation

## 2012-05-31 DIAGNOSIS — R5381 Other malaise: Secondary | ICD-10-CM | POA: Insufficient documentation

## 2012-05-31 DIAGNOSIS — IMO0001 Reserved for inherently not codable concepts without codable children: Secondary | ICD-10-CM | POA: Insufficient documentation

## 2012-05-31 DIAGNOSIS — S88119A Complete traumatic amputation at level between knee and ankle, unspecified lower leg, initial encounter: Secondary | ICD-10-CM | POA: Insufficient documentation

## 2012-06-04 ENCOUNTER — Ambulatory Visit: Payer: Medicare Other | Admitting: Physical Therapy

## 2012-06-06 ENCOUNTER — Ambulatory Visit: Payer: Medicare Other | Admitting: Physical Therapy

## 2012-06-11 ENCOUNTER — Ambulatory Visit: Payer: Medicare Other | Admitting: Physical Therapy

## 2012-06-13 ENCOUNTER — Ambulatory Visit: Payer: Medicare Other | Admitting: Physical Therapy

## 2012-06-18 ENCOUNTER — Ambulatory Visit: Payer: Medicare Other | Admitting: Physical Therapy

## 2012-06-20 ENCOUNTER — Ambulatory Visit: Payer: Medicare Other | Admitting: Physical Therapy

## 2012-06-25 ENCOUNTER — Ambulatory Visit: Payer: Medicare Other | Admitting: Physical Therapy

## 2012-06-27 ENCOUNTER — Ambulatory Visit: Payer: Medicare Other | Admitting: Physical Therapy

## 2012-07-02 ENCOUNTER — Ambulatory Visit: Payer: Medicare Other | Attending: Internal Medicine | Admitting: Physical Therapy

## 2012-07-02 DIAGNOSIS — R269 Unspecified abnormalities of gait and mobility: Secondary | ICD-10-CM | POA: Insufficient documentation

## 2012-07-02 DIAGNOSIS — S88119A Complete traumatic amputation at level between knee and ankle, unspecified lower leg, initial encounter: Secondary | ICD-10-CM | POA: Insufficient documentation

## 2012-07-02 DIAGNOSIS — R5381 Other malaise: Secondary | ICD-10-CM | POA: Insufficient documentation

## 2012-07-02 DIAGNOSIS — IMO0001 Reserved for inherently not codable concepts without codable children: Secondary | ICD-10-CM | POA: Insufficient documentation

## 2012-07-02 DIAGNOSIS — M6281 Muscle weakness (generalized): Secondary | ICD-10-CM | POA: Insufficient documentation

## 2012-07-04 ENCOUNTER — Ambulatory Visit: Payer: Medicare Other | Admitting: Physical Therapy

## 2012-07-09 ENCOUNTER — Ambulatory Visit: Payer: Medicare Other | Admitting: Physical Therapy

## 2012-07-11 ENCOUNTER — Ambulatory Visit: Payer: Medicare Other | Admitting: Physical Therapy

## 2012-07-16 ENCOUNTER — Ambulatory Visit: Payer: Medicare Other | Admitting: Physical Therapy

## 2012-07-18 ENCOUNTER — Ambulatory Visit: Payer: Medicare Other | Admitting: Physical Therapy

## 2012-07-23 ENCOUNTER — Ambulatory Visit: Payer: Medicare Other | Admitting: Physical Therapy

## 2012-07-25 ENCOUNTER — Ambulatory Visit: Payer: Medicare Other | Admitting: Physical Therapy

## 2012-07-29 ENCOUNTER — Ambulatory Visit: Payer: Medicare Other | Admitting: Physical Therapy

## 2012-07-30 ENCOUNTER — Ambulatory Visit: Payer: Medicare Other | Admitting: Physical Therapy

## 2012-07-31 ENCOUNTER — Encounter: Payer: Medicare Other | Admitting: Physical Therapy

## 2012-08-01 ENCOUNTER — Ambulatory Visit: Payer: Medicare Other | Admitting: Physical Therapy

## 2012-08-06 ENCOUNTER — Ambulatory Visit: Payer: Medicare Other | Attending: Internal Medicine | Admitting: Physical Therapy

## 2012-08-06 DIAGNOSIS — IMO0001 Reserved for inherently not codable concepts without codable children: Secondary | ICD-10-CM | POA: Insufficient documentation

## 2012-08-06 DIAGNOSIS — R5381 Other malaise: Secondary | ICD-10-CM | POA: Insufficient documentation

## 2012-08-06 DIAGNOSIS — M6281 Muscle weakness (generalized): Secondary | ICD-10-CM | POA: Insufficient documentation

## 2012-08-06 DIAGNOSIS — R269 Unspecified abnormalities of gait and mobility: Secondary | ICD-10-CM | POA: Insufficient documentation

## 2012-08-06 DIAGNOSIS — S88119A Complete traumatic amputation at level between knee and ankle, unspecified lower leg, initial encounter: Secondary | ICD-10-CM | POA: Insufficient documentation

## 2012-08-08 ENCOUNTER — Ambulatory Visit: Payer: Medicare Other | Admitting: Physical Therapy

## 2012-08-13 ENCOUNTER — Ambulatory Visit: Payer: Medicare Other | Admitting: Physical Therapy

## 2012-08-15 ENCOUNTER — Ambulatory Visit: Payer: Medicare Other | Admitting: Physical Therapy

## 2012-08-20 ENCOUNTER — Ambulatory Visit: Payer: Medicare Other | Admitting: Physical Therapy

## 2012-08-22 ENCOUNTER — Ambulatory Visit: Payer: Medicare Other | Admitting: Physical Therapy

## 2012-08-27 ENCOUNTER — Ambulatory Visit: Payer: Medicare Other | Admitting: Physical Therapy

## 2012-08-29 ENCOUNTER — Ambulatory Visit: Payer: Medicare Other | Admitting: Physical Therapy

## 2012-09-03 ENCOUNTER — Ambulatory Visit: Payer: Medicare Other | Attending: Internal Medicine | Admitting: Physical Therapy

## 2012-09-03 DIAGNOSIS — R269 Unspecified abnormalities of gait and mobility: Secondary | ICD-10-CM | POA: Insufficient documentation

## 2012-09-03 DIAGNOSIS — IMO0001 Reserved for inherently not codable concepts without codable children: Secondary | ICD-10-CM | POA: Insufficient documentation

## 2012-09-03 DIAGNOSIS — S88119A Complete traumatic amputation at level between knee and ankle, unspecified lower leg, initial encounter: Secondary | ICD-10-CM | POA: Insufficient documentation

## 2012-09-03 DIAGNOSIS — M6281 Muscle weakness (generalized): Secondary | ICD-10-CM | POA: Insufficient documentation

## 2012-09-03 DIAGNOSIS — R5381 Other malaise: Secondary | ICD-10-CM | POA: Insufficient documentation

## 2012-09-05 ENCOUNTER — Ambulatory Visit: Payer: Medicare Other | Admitting: Physical Therapy

## 2012-10-02 ENCOUNTER — Ambulatory Visit: Payer: Medicare Other | Admitting: Physical Therapy

## 2012-10-08 ENCOUNTER — Ambulatory Visit: Payer: Medicare Other | Attending: Orthopedic Surgery | Admitting: Physical Therapy

## 2012-10-08 DIAGNOSIS — M6281 Muscle weakness (generalized): Secondary | ICD-10-CM | POA: Insufficient documentation

## 2012-10-08 DIAGNOSIS — R269 Unspecified abnormalities of gait and mobility: Secondary | ICD-10-CM | POA: Insufficient documentation

## 2012-10-08 DIAGNOSIS — R5381 Other malaise: Secondary | ICD-10-CM | POA: Insufficient documentation

## 2012-10-08 DIAGNOSIS — IMO0001 Reserved for inherently not codable concepts without codable children: Secondary | ICD-10-CM | POA: Insufficient documentation

## 2012-10-08 DIAGNOSIS — S88119A Complete traumatic amputation at level between knee and ankle, unspecified lower leg, initial encounter: Secondary | ICD-10-CM | POA: Insufficient documentation
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
## 2013-06-13 IMAGING — DX DG CHEST 1V PORT
1 series · 1 of 1 positions shown · non-contrast
Comparison: 06/27/2011.

CLINICAL DATA: Check endotracheal tube.

PORTABLE CHEST - 1 VIEW

[AP]
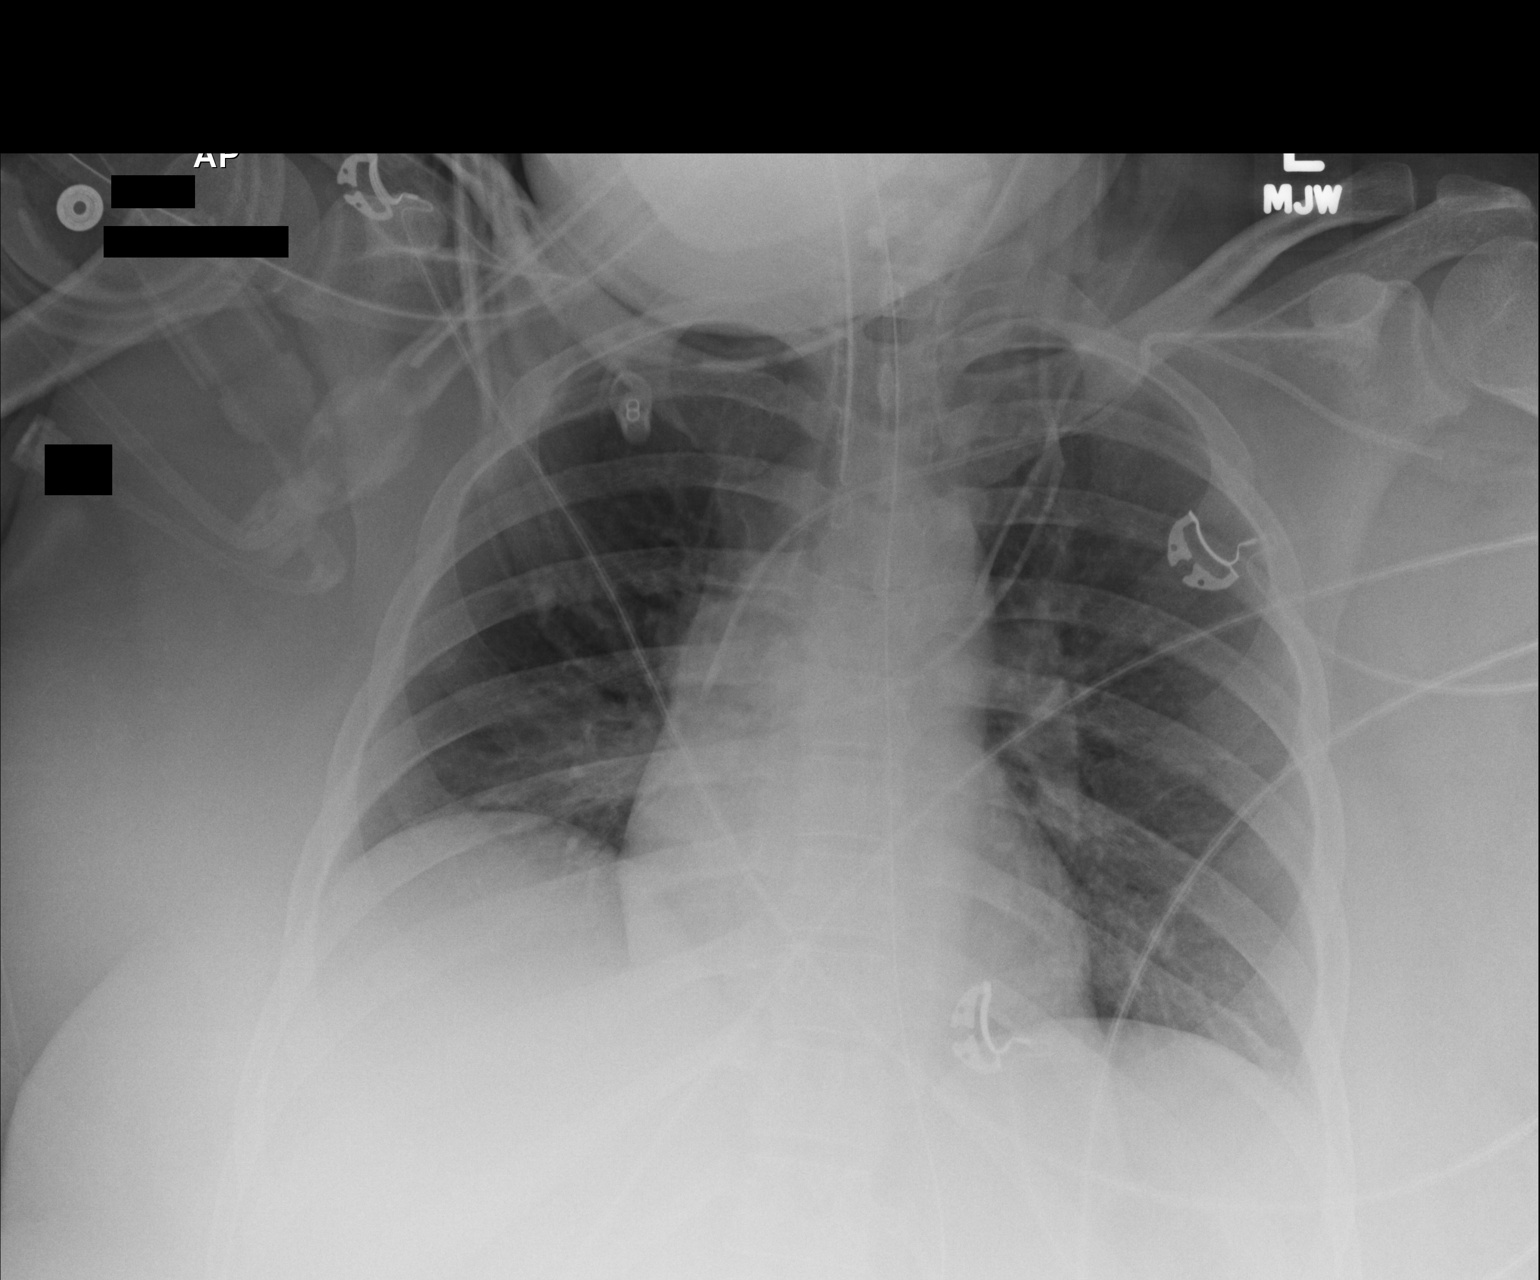

[1 of 1 positions shown; findings below may reference images not displayed]

FINDINGS: Endotracheal tube is in satisfactory position.  Left
subclavian central line tip projects over the SVC.  Nasogastric
tube is followed into the stomach.  Heart size normal.  Lungs are
low in volume with mild bibasilar air space disease.  No definite
pleural fluid.
IMPRESSION: Low lung volumes with mild bibasilar air space disease, possibly
due to atelectasis.
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
## 2013-06-14 IMAGING — CR DG WRIST 2V*L*
2 series · 2 of 2 positions shown · non-contrast
Comparison: None.

CLINICAL DATA: Trauma, pain.

LEFT WRIST - 2 VIEW

[PA]
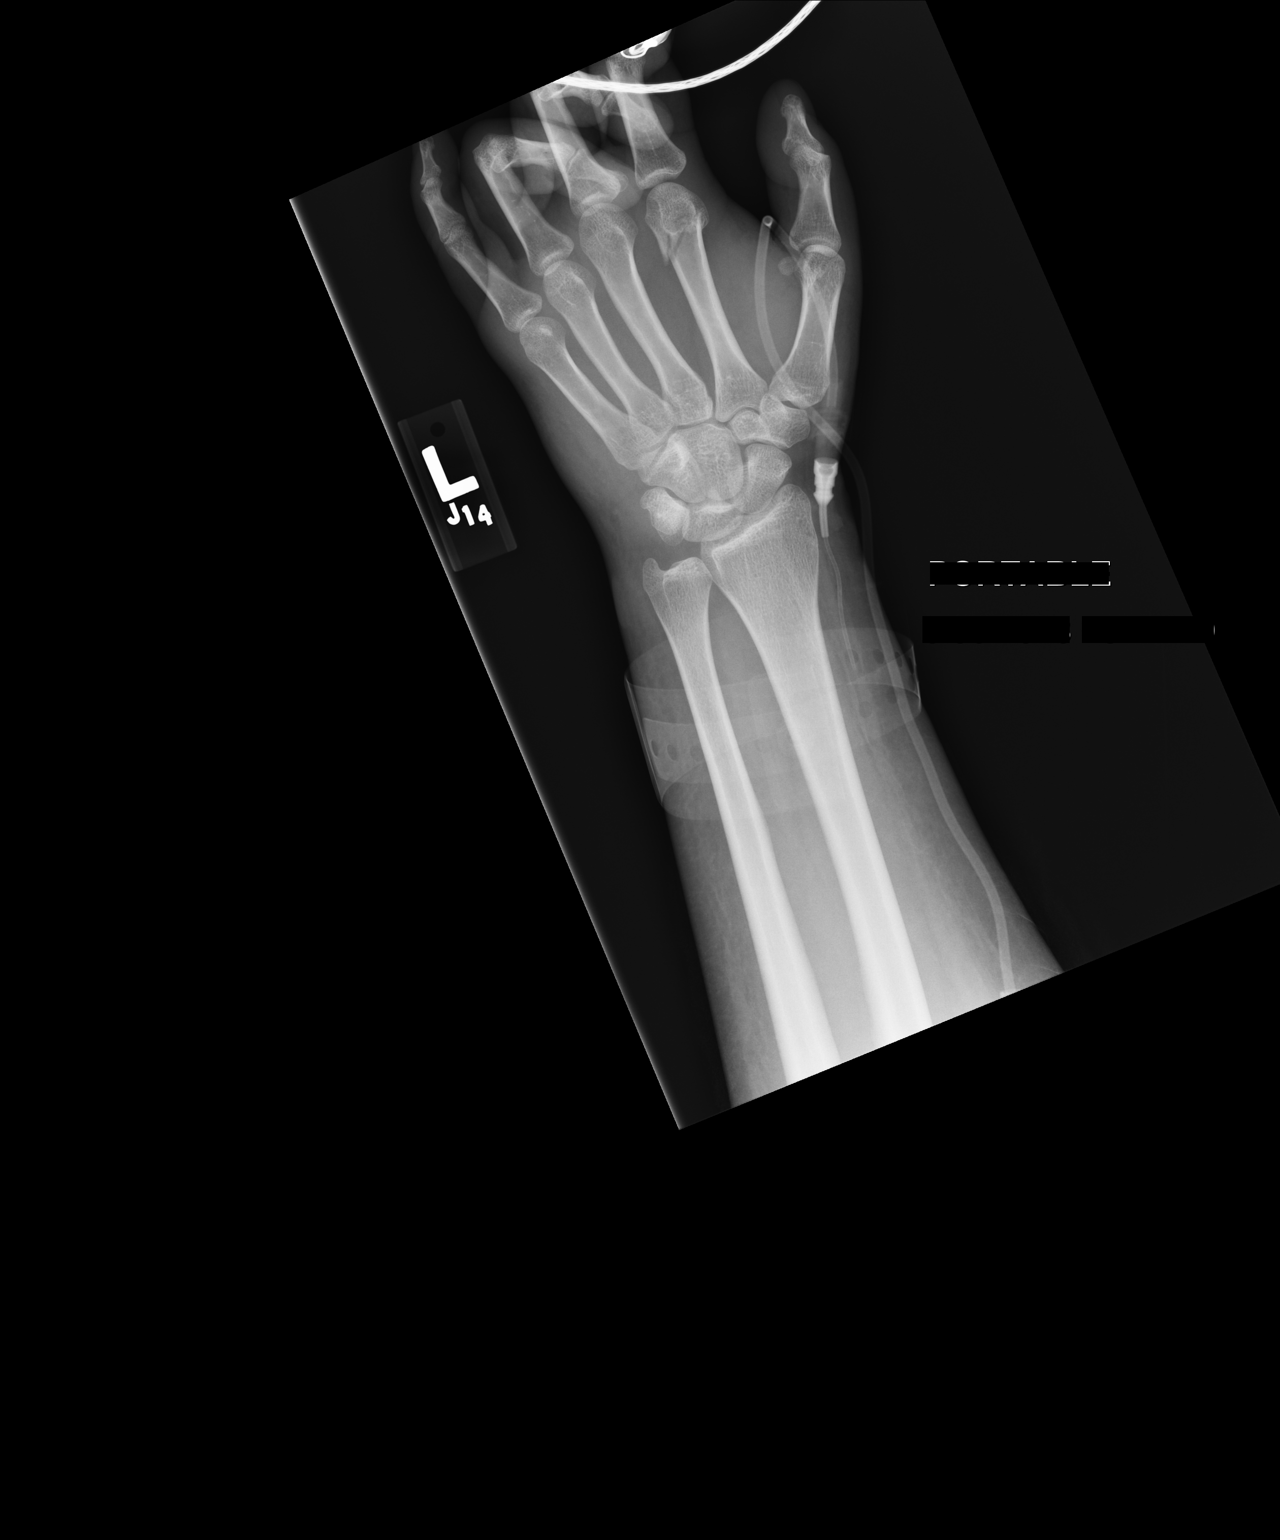

[lateral]
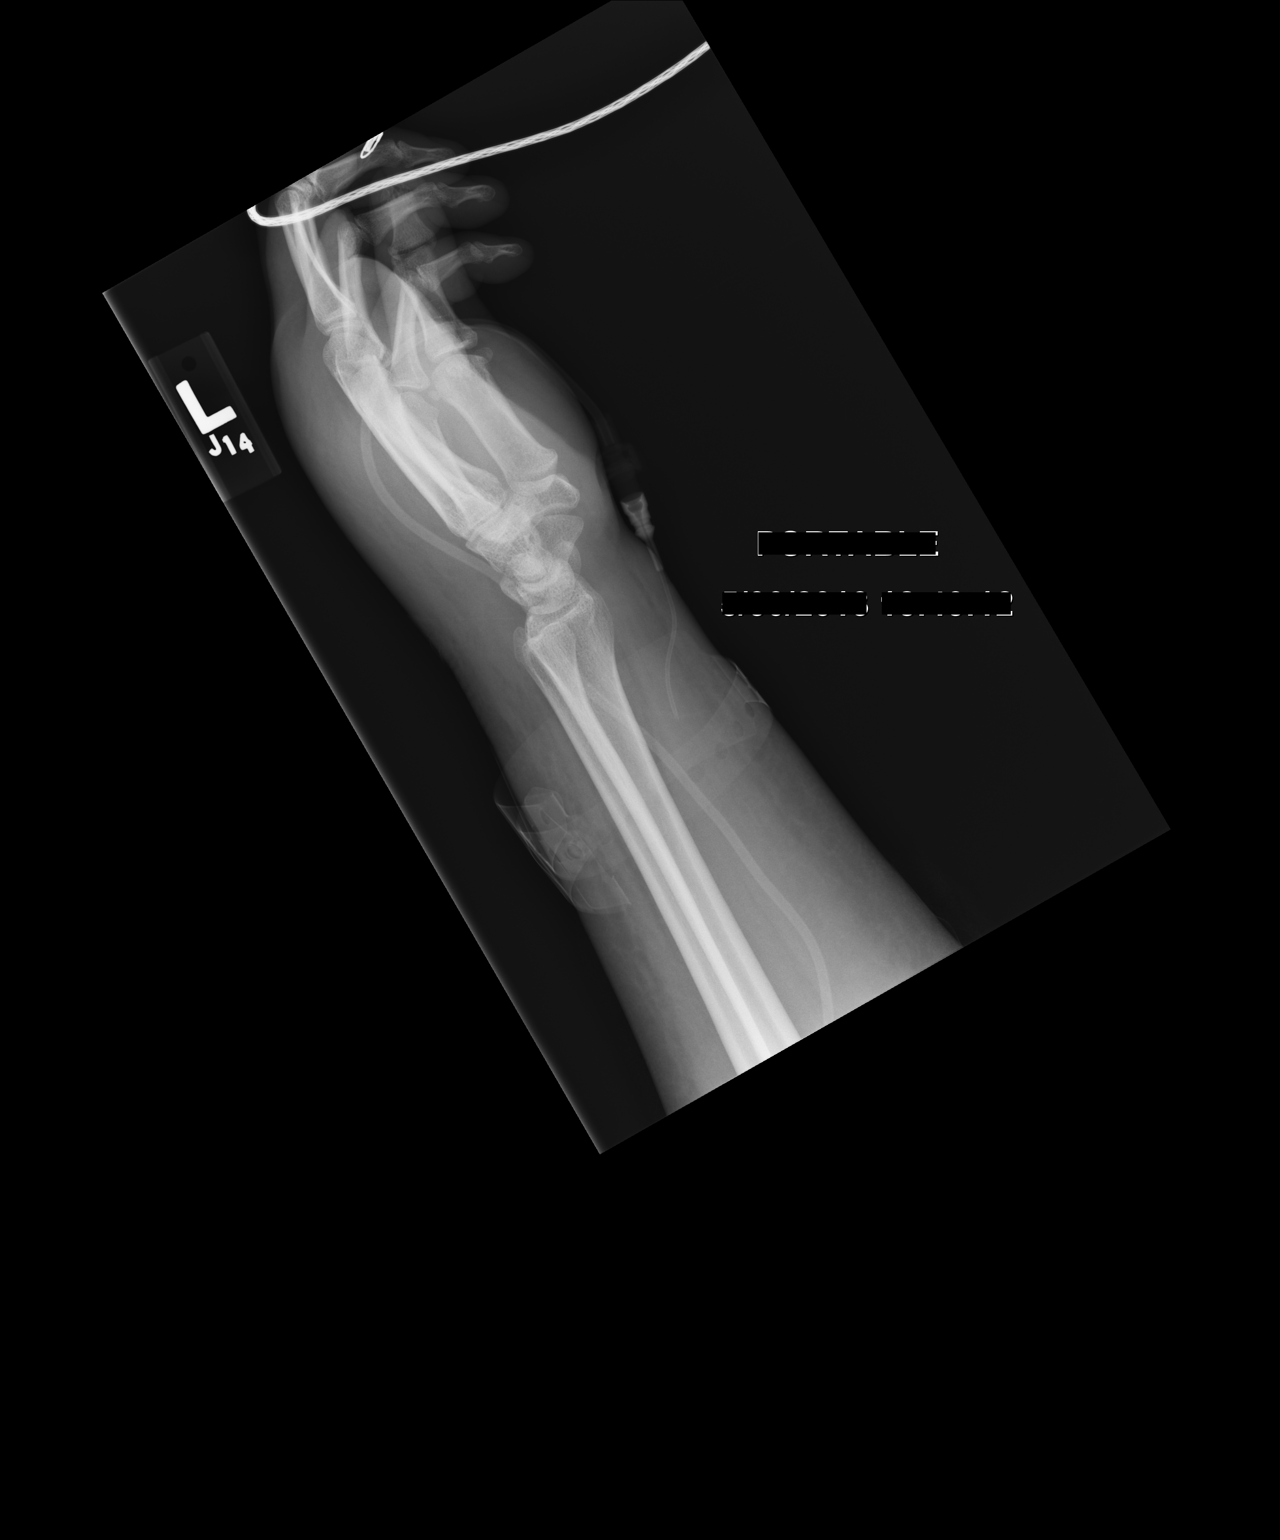

[2 of 2 positions shown; findings below may reference images not displayed]

FINDINGS: The patient has a fracture of the neck of the second
metacarpal.  No other acute bony or joint abnormality is
identified.  The patient's fracture shows mild volar angulation.
IMPRESSION: Acute fracture neck of the second metacarpal.

## 2014-07-13 ENCOUNTER — Encounter (HOSPITAL_COMMUNITY): Payer: Self-pay

## 2014-07-13 ENCOUNTER — Emergency Department (HOSPITAL_COMMUNITY)
Admission: EM | Admit: 2014-07-13 | Discharge: 2014-07-17 | Disposition: A | Discharge status: Other Acute Inpatient Hospital | Payer: Medicare Other | Attending: Emergency Medicine | Admitting: Emergency Medicine

## 2014-07-13 ENCOUNTER — Inpatient Hospital Stay (HOSPITAL_COMMUNITY): Admission: EM | Admit: 2014-07-13 | Payer: Medicare Other | Source: Intra-hospital | Admitting: Psychiatry

## 2014-07-13 DIAGNOSIS — T1491 Suicide attempt: Secondary | ICD-10-CM | POA: Diagnosis not present

## 2014-07-13 DIAGNOSIS — F319 Bipolar disorder, unspecified: Secondary | ICD-10-CM | POA: Insufficient documentation

## 2014-07-13 DIAGNOSIS — F3181 Bipolar II disorder: Secondary | ICD-10-CM | POA: Diagnosis not present

## 2014-07-13 DIAGNOSIS — Z79899 Other long term (current) drug therapy: Secondary | ICD-10-CM | POA: Diagnosis not present

## 2014-07-13 DIAGNOSIS — Z87440 Personal history of urinary (tract) infections: Secondary | ICD-10-CM | POA: Insufficient documentation

## 2014-07-13 DIAGNOSIS — F603 Borderline personality disorder: Secondary | ICD-10-CM | POA: Diagnosis not present

## 2014-07-13 DIAGNOSIS — R45851 Suicidal ideations: Secondary | ICD-10-CM | POA: Diagnosis present

## 2014-07-13 HISTORY — DX: Major depressive disorder, single episode, unspecified: F32.9

## 2014-07-13 HISTORY — DX: Depression, unspecified: F32.A

## 2014-07-13 LAB — CBC
HEMATOCRIT: 40.9 % (ref 36.0–46.0)
Hemoglobin: 13.1 g/dL (ref 12.0–15.0)
MCH: 27.6 pg (ref 26.0–34.0)
MCHC: 32 g/dL (ref 30.0–36.0)
MCV: 86.1 fL (ref 78.0–100.0)
Platelets: 251 10*3/uL (ref 150–400)
RBC: 4.75 MIL/uL (ref 3.87–5.11)
RDW: 14.5 % (ref 11.5–15.5)
WBC: 6.7 10*3/uL (ref 4.0–10.5)

## 2014-07-13 LAB — COMPREHENSIVE METABOLIC PANEL
ALBUMIN: 4 g/dL (ref 3.5–5.0)
ALT: 23 U/L (ref 14–54)
AST: 25 U/L (ref 15–41)
Alkaline Phosphatase: 83 U/L (ref 38–126)
Anion gap: 6 (ref 5–15)
BILIRUBIN TOTAL: 0.3 mg/dL (ref 0.3–1.2)
BUN: 8 mg/dL (ref 6–20)
CO2: 28 mmol/L (ref 22–32)
CREATININE: 0.73 mg/dL (ref 0.44–1.00)
Calcium: 9 mg/dL (ref 8.9–10.3)
Chloride: 104 mmol/L (ref 101–111)
GFR calc Af Amer: >60 mL/min (ref >60)
Glucose, Bld: 105 mg/dL — ABNORMAL HIGH (ref 65–99)
Potassium: 3.7 mmol/L (ref 3.5–5.1)
SODIUM: 138 mmol/L (ref 135–145)
Total Protein: 8.3 g/dL — ABNORMAL HIGH (ref 6.5–8.1)

## 2014-07-13 LAB — ETHANOL

## 2014-07-13 LAB — RAPID URINE DRUG SCREEN, HOSP PERFORMED
Amphetamines: NOT DETECTED
BENZODIAZEPINES: NOT DETECTED
Barbiturates: NOT DETECTED
Cocaine: NOT DETECTED
OPIATES: NOT DETECTED
Tetrahydrocannabinol: NOT DETECTED

## 2014-07-13 LAB — ACETAMINOPHEN LEVEL: Acetaminophen (Tylenol), Serum: <10 ug/mL — ABNORMAL LOW (ref 10–30)

## 2014-07-13 LAB — SALICYLATE LEVEL

## 2014-07-13 MED ORDER — ONDANSETRON HCL 4 MG PO TABS: 4.0000 mg | ORAL_TABLET | Freq: Three times a day (TID) | ORAL | Status: DC | PRN

## 2014-07-13 MED ORDER — IBUPROFEN 200 MG PO TABS
600.0000 mg | ORAL_TABLET | Freq: Three times a day (TID) | ORAL | Status: DC | PRN
  Administered 2014-07-14 – 2014-07-16 (×3): 600 mg via ORAL
  Filled 2014-07-13 (×3): qty 3

## 2014-07-13 MED ORDER — ACETAMINOPHEN 325 MG PO TABS: 650.0000 mg | ORAL_TABLET | ORAL | Status: DC | PRN

## 2014-07-13 MED ORDER — LORAZEPAM 1 MG PO TABS
1.0000 mg | ORAL_TABLET | Freq: Three times a day (TID) | ORAL | Status: DC | PRN
  Administered 2014-07-14 (×2): 1 mg via ORAL
  Filled 2014-07-13 (×2): qty 1

## 2014-07-13 MED ORDER — NICOTINE 21 MG/24HR TD PT24: 21.0000 mg | MEDICATED_PATCH | Freq: Every day | TRANSDERMAL | Status: DC

## 2014-07-13 MED ORDER — ALUM & MAG HYDROXIDE-SIMETH 200-200-20 MG/5ML PO SUSP: 30.0000 mL | ORAL | Status: DC | PRN

## 2014-07-13 MED ORDER — ALBUTEROL SULFATE HFA 108 (90 BASE) MCG/ACT IN AERS
2.0000 | INHALATION_SPRAY | Freq: Four times a day (QID) | RESPIRATORY_TRACT | Status: DC | PRN
  Administered 2014-07-15 – 2014-07-16 (×4): 2 via RESPIRATORY_TRACT
  Filled 2014-07-13 (×3): qty 6.7

## 2014-07-13 NOTE — ED Notes (Signed)
Pt here with [facility] voluntarily, pt states that she's not sleeping and her medications need to be adjusted, she attempted to call and was unable to get an appointment, two days ago she took a handful of her cymbalta and zyprexa hoping to overdose. Patient needs placement from our [facility]
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
## 2014-07-13 NOTE — ED Provider Notes (Signed)
CSN: [id number]     Arrival date & time 07/13/14  1910 History   First MD Initiated Contact with Patient 07/13/14 1955     Chief Complaint  Patient presents with  . Suicidal     (Consider location/radiation/quality/duration/timing/severity/associated sxs/prior Treatment) HPI Comments: Patient here with complaints of increased depression along with a suicide attempt 2 days ago. Patient states she's been off her medications. Denies any command hallucinations. Was seen at her psychiatrist today and sent here for medical clearance and placement. Patient states that she didn't have full of her Cymbalta and Zyprexa along with Motrin. Has a history of suicide attempt in the past. States increased stress and anxiety at home. No recent fever or chills or neck pain or headache. Does note decreased sleep as well as anhedonia  The history is provided by the patient.    Past Medical History  Diagnosis Date  . Bipolar 1 disorder   . UTI (lower urinary tract infection) 10/20/11    being treated, has 4 doses abx left  . Suicide attempt   . Transfusion of blood during current hospitalization     SUMMER 2013   Past Surgical History  Procedure Laterality Date  . Amputation  06/29/2011    Procedure: AMPUTATION ABOVE KNEE;  Surgeon: Rozanna Box, MD;  Location: [place];  Service: Orthopedics;  Laterality: Right;  abovve knee amputation of right leg  . I&d extremity  06/29/2011    Procedure: IRRIGATION AND DEBRIDEMENT EXTREMITY;  Surgeon: Rozanna Box, MD;  Location: [place];  Service: Orthopedics;  Laterality: Right;  Irrigation and debridement of right forearm with tendon repair.  . Amputation  06/27/2011    Procedure: AMPUTATION ABOVE KNEE;  Surgeon: Rozanna Box, MD;  Location: [place];  Service: Orthopedics;  Laterality: Right;  . External fixation leg  06/27/2011    Procedure: EXTERNAL FIXATION LEG;  Surgeon: Rozanna Box, MD;  Location: [place];  Service: Orthopedics;  Laterality: Left;  . Tibia im  nail insertion  07/14/2011    Procedure: INTRAMEDULLARY (IM) NAIL TIBIAL;  Surgeon: Rozanna Box, MD;  Location: [place];  Service: Orthopedics;  Laterality: Left;  . External fixation removal  07/14/2011    Procedure: REMOVAL EXTERNAL FIXATION LEG;  Surgeon: Rozanna Box, MD;  Location: [place];  Service: Orthopedics;  Laterality: Left;  . Tibia im nail insertion  10/24/2011    Procedure: INTRAMEDULLARY (IM) NAIL TIBIAL;  Surgeon: Rozanna Box, MD;  Location: [place];  Service: Orthopedics;  Laterality: Left;  . Hardware removal  10/24/2011    Procedure: HARDWARE REMOVAL;  Surgeon: Rozanna Box, MD;  Location: [place];  Service: Orthopedics;  Laterality: Left;  left tibial   . Nose surgery      BROKEN     . Tibia im nail insertion Left 04/04/2012    Procedure: INTRAMEDULLARY (IM) NAIL TIBIAL;  Surgeon: Rozanna Box, MD;  Location: [place];  Service: Orthopedics;  Laterality: Left;  . Hardware removal Left 04/04/2012    Procedure: HARDWARE REMOVAL;  Surgeon: Rozanna Box, MD;  Location: [place];  Service: Orthopedics;  Laterality: Left;   History reviewed. No pertinent family history. History  Substance Use Topics  . Smoking status: Never Smoker   . Smokeless tobacco: Not on file  . Alcohol Use: No   OB History    No data available     Review of Systems  All other systems reviewed and are negative.  Allergies  Review of patient's allergies indicates no known allergies.  Home Medications   Prior to Admission medications   Medication Sig Start Date End Date Taking? Authorizing Provider  albuterol (PROVENTIL HFA;VENTOLIN HFA) 108 (90 BASE) MCG/ACT inhaler Inhale 2 puffs into the lungs every 6 (six) hours as needed for wheezing.   Yes Historical Provider, MD  docusate sodium 100 MG CAPS Take 100 mg by mouth 2 (two) times daily. Patient not taking: Reported on 07/13/2014 04/05/12   Ainsley Spinner, PA-C  DULoxetine (CYMBALTA) 60 MG capsule Take 60 mg by mouth daily.   Yes Historical  Provider, MD  enoxaparin (LOVENOX) 40 MG/0.4ML injection Inject 0.4 mLs (40 mg total) into the skin daily. Patient not taking: Reported on 07/13/2014 04/05/12   Ainsley Spinner, PA-C  HYDROcodone-acetaminophen (NORCO/VICODIN) 5-325 MG per tablet Take 1-2 tablets by mouth every 6 (six) hours as needed. Patient not taking: Reported on 07/13/2014 04/08/12   Ainsley Spinner, PA-C  methocarbamol (ROBAXIN) 500 MG tablet Take 1 tablet (500 mg total) by mouth every 6 (six) hours as needed. Patient not taking: Reported on 07/13/2014 04/05/12   Ainsley Spinner, PA-C  OLANZapine (ZYPREXA) 10 MG tablet Take 5 mg by mouth daily. 05/06/14  Yes Historical Provider, MD  oxyCODONE (OXY IR/ROXICODONE) 5 MG immediate release tablet Take 1-2 tablets (5-10 mg total) by mouth every 4 (four) hours as needed. Patient not taking: Reported on 07/13/2014 04/05/12   Ainsley Spinner, PA-C   BP 131/70 mmHg  Pulse 85  Temp(Src) 98.8 F (37.1 C) (Oral)  Resp 20  SpO2 96%  LMP 07/01/2014 Physical Exam  Constitutional: She is oriented to person, place, and time. She appears well-developed and well-nourished.  Non-toxic appearance. No distress.  HENT:  Head: Normocephalic and atraumatic.  Eyes: Conjunctivae, EOM and lids are normal. Pupils are equal, round, and reactive to light.  Neck: Normal range of motion. Neck supple. No tracheal deviation present. No thyroid mass present.  Cardiovascular: Normal rate, regular rhythm and normal heart sounds.  Exam reveals no gallop.   No murmur heard. Pulmonary/Chest: Effort normal and breath sounds normal. No stridor. No respiratory distress. She has no decreased breath sounds. She has no wheezes. She has no rhonchi. She has no rales.  Abdominal: Soft. Normal appearance and bowel sounds are normal. She exhibits no distension. There is no tenderness. There is no rebound and no CVA tenderness.  Musculoskeletal: Normal range of motion. She exhibits no edema or tenderness.  Neurological: She is alert and oriented to  person, place, and time. She has normal strength. No cranial nerve deficit or sensory deficit. GCS eye subscore is 4. GCS verbal subscore is 5. GCS motor subscore is 6.  Skin: Skin is warm and dry. No abrasion and no rash noted.  Psychiatric: She has a normal mood and affect. Her speech is normal. She is slowed. She expresses no homicidal and no suicidal ideation. She expresses no suicidal plans and no homicidal plans.  Nursing note and vitals reviewed.   ED Course  Procedures (including critical care time) Labs Review Labs Reviewed  CBC  ACETAMINOPHEN LEVEL  COMPREHENSIVE METABOLIC PANEL  ETHANOL  SALICYLATE LEVEL  URINE RAPID DRUG SCREEN, HOSP PERFORMED    Imaging Review No results found.   EKG Interpretation None      MDM   Final diagnoses:  None    Patient with medically cleared and then placed by [facility]    Lacretia Leigh, MD 07/13/14 2028

## 2014-07-14 DIAGNOSIS — F3181 Bipolar II disorder: Secondary | ICD-10-CM | POA: Diagnosis not present

## 2014-07-14 DIAGNOSIS — F319 Bipolar disorder, unspecified: Secondary | ICD-10-CM | POA: Diagnosis not present

## 2014-07-14 MED ORDER — FLUOXETINE HCL 10 MG PO CAPS
10.0000 mg | ORAL_CAPSULE | Freq: Every day | ORAL | Status: DC
  Administered 2014-07-14 – 2014-07-17 (×3): 10 mg via ORAL
  Filled 2014-07-14 (×6): qty 1

## 2014-07-14 MED ORDER — SALINE SPRAY 0.65 % NA SOLN
1.0000 | NASAL | Status: DC | PRN
  Filled 2014-07-14 (×2): qty 44

## 2014-07-14 MED ORDER — OLANZAPINE 5 MG PO TABS
5.0000 mg | ORAL_TABLET | Freq: Every day | ORAL | Status: DC
  Administered 2014-07-14: 5 mg via ORAL
  Filled 2014-07-14: qty 1

## 2014-07-14 NOTE — ED Notes (Signed)
TTS at bedside. 

## 2014-07-14 NOTE — ED Notes (Signed)
Pt requested daily meds from RN. RN informed TTS of this and then pt of same. Pt then stated she did not want to start back on Cymbalta, "because it increases suicidal thoughts in teens and young adults and I don't want to be on that, how do you know that's not what is happening to me now, and I overdosed on it, how can you put me back on that!" RN informed TTS of same and NP reported she would order what she felt was appropriate when she was able to. Would be delayed in entering orders due to seeing other pts. Pt also requesting saline spray for nose congestion, worsened by using oxygen at night. Order okayed and placed by EDP. Pt updated on plan for referrals being sent out and awaiting placement.

## 2014-07-14 NOTE — BH Assessment (Signed)
[facility] Assessment Progress Note  At 13:36 Cletus Gash from [facility] called.  They are willing to accept pt to their wait list, but pt would have to be admitted to their geriatric psychiatry unit to accommodate her physical needs.  An appropriate bed is not available at this time.  Jalene Mullet, MA Triage Specialist [phone]

## 2014-07-14 NOTE — ED Notes (Signed)
Pt is asleep. Sitter is at bedside. Breathing is WNL, does not appear to be in distress.

## 2014-07-14 NOTE — ED Notes (Signed)
Per pt, she has OSA and has difficulty breathing at night so she requested oxygen. Pt O2 saturation 92% on room air. RN placed pt on 1.5L oxygen via nasal cannula, O2 sat increased to 96%. No acute distress noted, no further needs expressed.

## 2014-07-14 NOTE — ED Notes (Signed)
Pt asked about daily medications. Pt reports taking Cymbalta 60 mg and Zyprexa 10 mg half tablet.

## 2014-07-14 NOTE — BH Assessment (Signed)
[facility] Assessment Progress Note  The following facilities have been contacted to seek placement for this pt, with results as noted:  Beds available, information sent, decision pending:  [facility]  At capacity:  [facility] [place], [place] Triage Specialist [phone]
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
## 2014-07-15 DIAGNOSIS — F3181 Bipolar II disorder: Secondary | ICD-10-CM | POA: Diagnosis not present

## 2014-07-15 DIAGNOSIS — F319 Bipolar disorder, unspecified: Secondary | ICD-10-CM | POA: Diagnosis not present

## 2014-07-15 MED ORDER — LORAZEPAM 1 MG PO TABS
1.0000 mg | ORAL_TABLET | Freq: Three times a day (TID) | ORAL | Status: DC | PRN
  Administered 2014-07-16: 1 mg via ORAL
  Filled 2014-07-15 (×2): qty 1

## 2014-07-15 MED ORDER — OLANZAPINE 10 MG PO TABS
10.0000 mg | ORAL_TABLET | Freq: Every day | ORAL | Status: DC
  Administered 2014-07-15 – 2014-07-16 (×2): 10 mg via ORAL
  Filled 2014-07-15 (×2): qty 1

## 2014-07-15 MED ORDER — HYDROXYZINE HCL 25 MG PO TABS
50.0000 mg | ORAL_TABLET | Freq: Three times a day (TID) | ORAL | Status: DC | PRN
  Administered 2014-07-15 – 2014-07-17 (×4): 50 mg via ORAL
  Filled 2014-07-15 (×8): qty 2

## 2014-07-15 NOTE — ED Notes (Signed)
Patient's behavior continued to escalate after being told to stay in her room.  Security was called. Pt stated she was her voluntarily, was informed she was IVC. [employer] officer reviewed her IVC papers with her.  Patient refused ativan because she said it would affect her placement, requested vistaril.  Pt was informed that it was too early for an additional dose of vistaril and she stated she would go into her room and calm herself.

## 2014-07-15 NOTE — ED Notes (Signed)
Pt given phone to make call.

## 2014-07-15 NOTE — BHH Counselor (Signed)
[facility] Assessment Progress Note  Referrals sent for inpatient consideration via fax to [facility], [facility], [facility], [place], [place], [place], [place]. Per Tamika at [facility], patient has been denied due to medical acuity.   Kenna Gilbert. Lovena Le, [place], [place] Disposition Counselor

## 2014-07-15 NOTE — ED Notes (Signed)
Pt is asleep, breathing is WNL, does not appear to be in any distress, sitter is at bedside.

## 2014-07-15 NOTE — ED Notes (Signed)
Patient resting in [facility] bed. Calm, cooperative. Resting in bed.

## 2014-07-15 NOTE — ED Notes (Signed)
Pt had to be moved to another room because she began to get aggressive verbally and started to communicate threats to RN and other staff. Pt was also talking to other patient that was next to her to try to heighten the situation. Pt then began to pound her fists together and stated that she wanted to punch the RN in the face. Pt then began slinging stuff around stating that she has to "turn up" in order to get some attention and get what she needs. Pt was upset that the phone went dead and that she had to wait to make another call while another pt had the phone. Pt then began to state that she was being treated unfairly, and that other patients knew that they could get phone calls and she just found out today. Pt was also upset that RN would not allow her to keep going in and out of her locker. RN allowed pt to get her cell phone out of her locker earlier so that she could write down the numbers of her family and friends that she wanted to call. Pt got upset because she stated that she needed body wash that was in her locker and was told that she can not keep those items in her room.

## 2014-07-15 NOTE — ED Notes (Signed)
Pt upset and asking why she can not have a sitter to sit in the room with her so that she can talk to them. Pt told that she does has a Actuary but she can not stay in the room with the pt at this time and has to remain outside of her door. Pt becomes tearful and states that she is ready to leave this place and that someone has to "turn up" in order to get what they want. Explained to pt that her needs were being met by the staff available and that was not another available staff member to just sit with the pt. Pt asks Dr. Doy Mince in the room so that she can have a private conversation with him. Dr. Doy Mince talks to the pt at the bedside. Pt requesting oxygen to be placed so that she can sleep. Pt placed on 2L N/C at this time.

## 2014-07-15 NOTE — ED Notes (Addendum)
Pt is alert, talkative, breathing is WNL, does not appear to be in any distress, sitter is at bedside.

## 2014-07-15 NOTE — ED Notes (Signed)
Pt requesting to use personal body wash and deodorant so that she could wash up because she was feeling sweaty. Pt allowed to use personal items and then the items were returned to locker #30.
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
## 2014-07-15 NOTE — ED Provider Notes (Signed)
3:49 PM Pt has been assessed by psychiatry with recommendation for inpatient treatment.  However, she wishes to leave.  She has provided conflicting stories about her overdose attempt and suicidality.  However, during out conversation, she frequently brought up issues of death and asked why I should care if she lives or dies.  She then attempted to discuss issues of assisted suicide.  I think she remains a danger to herself.  Therefore, will file IVC paperwork.    Serita Grit, MD 07/15/14 [phone]

## 2014-07-15 NOTE — ED Notes (Signed)
Patient was using wheelchair in the unit.  Was asked to return to her room for change of shift.  Informed patient the oncoming RN will be informed of pt request to retrieve liner for prosthesis from locker.

## 2014-07-15 NOTE — ED Notes (Signed)
Pt sts she wants her medication times changed, sts at home she takes prozac and zyprexa at bedtime, and doesn't want to take it during the day because it makes her too drowsy and sleepy. Pharmacy notified to adjust times.

## 2014-07-16 DIAGNOSIS — F3181 Bipolar II disorder: Secondary | ICD-10-CM | POA: Diagnosis not present

## 2014-07-16 DIAGNOSIS — F319 Bipolar disorder, unspecified: Secondary | ICD-10-CM | POA: Diagnosis not present

## 2014-07-16 MED ORDER — WHITE PETROLATUM GEL
Status: DC | PRN
  Administered 2014-07-16: 12:00:00 via TOPICAL
  Filled 2014-07-16: qty 5

## 2014-07-16 MED ORDER — DOCUSATE SODIUM 50 MG PO CAPS
50.0000 mg | ORAL_CAPSULE | Freq: Every day | ORAL | Status: DC | PRN
  Administered 2014-07-16: 50 mg via ORAL
  Filled 2014-07-16 (×2): qty 1

## 2014-07-16 NOTE — Progress Notes (Signed)
ED CM received info from covering SW about contacting TTS about pt Cm reviewed EPIC notes on pt and spoke with TTS, [place] who stated there was a consult with [facility] about a possible bed but pending Pt denied by OV, pending response from referrals to [facility], [facility], [facility], [facility] and [facility] Covering SW sent update and provided TTS contact numbers

## 2014-07-16 NOTE — ED Notes (Signed)
Pt up to the nursing station using wheelchair asking for a meal tray that is sitting on the counter. Pt states that she had dinner but she is still hungry and she asked for double portions, but did not receive it with dinner. Pt states "alot of people have been discharged, so it is probably somebody's tray that is not here". Pt given extra meal tray once it was verified that every one on the unit had received a tray.

## 2014-07-16 NOTE — ED Notes (Signed)
Pt requested oxygen therapy to help with sleep. Order from MD verbal with read back; orders verified.

## 2014-07-16 NOTE — ED Notes (Signed)
Patient is visablly disgruntled. RN Marcie Bal has called patient advocate and left a message requesting for services. Patient is sitting at door at this time speaking about how frustrated she is.

## 2014-07-16 NOTE — ED Notes (Signed)
Pt is very bossy, manipulative, demanding and demeaning when talking to the staff. She has made multiple demands about any and everything and has an excuse when she demands what she needs. She makes up several indications as to why she needs whatever. She has been verbally aggressive to the writer and has made smart remarks and even attempted to make fun of the writers skin. She has not been cooperative with her nurse especially when she has asked her to go back to her room. She is very argumentative with her nurse when she responds to some of the demands that she has made.

## 2014-07-16 NOTE — ED Notes (Addendum)
Pt requested motrin for a headache and her inhaler for a breathing treatment. Lung sounds are clear and pt does not appear to have any shortness of breath.Pt appears very needy and wants to know when she will leave here. Pt continues to be very intrusive and demanding. Pt got out of the shower and stated,"Yall have to give me my leg now ." Pt reassured the nurse she would not hurt herself and an order was obtained from the MD. Pt has multiple requests which she has been told during the time she is here are not part of the policy. Pt continues to have to be instructed to please go to her room and not remain at the nurses station. She continues to engage in manipulative behavior and staff splitting. 11:50am-Pt states her head feels much better and the headache is a 1/10.  She does appear calmer. 12:45p_phoned the pt advocate and left a voice mail for her to come and see the pt as soon as possible. Pt has multiple complaints and stated,"I am keeping a notebook and all of you better look for other jobs." NP made aware and spoke with the pt concerning bed availablity for inpt admission. Pt stated,"I have seen pts come and go and I know you are keeping me here because I am in a wheelchair." Reassured pt that was not the case and explained to the pt that different pts have different reasons for admissions and that inpt facilties have different halls for different diagnosis. .Pt is being kept abreast of her inpt. bed situation. She has been offered food and drink and at this time appears very comfortable. Pt presently has on her leg prosthesis. She insisted putting it on before the nurse had a chance to fully charge it. Pt was also given her bible out of her locker and was upset that she was not permitted to put on her clothes. Pt was also angry  that she was given a crayon with a choice of colors to use rather than an ink pen for writing  .Informed pt we are not allowed to give pts pens on the unit for safety reasons.  1p-Pt continues to be very manipulative and demanding and once one request is filled she has mulitple other request. Pt gets very upset when staff leave her room to take care of another pt. 1:40p-pt is insistent we are keeping her here due to her disability. Still awaiting pt advocate phone call back - phoned the nursing supervisor to speak to the pt. Pt is insistent on having her physical therapist from the outside to come here to treat her. Informed pt that our NP was putting in a PT consult for an inpt NP. Nursing supervisor ,Santiago Glad came down to the unit.
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
## 2014-07-16 NOTE — ED Notes (Signed)
Pt is awake and alert, denies HI/SI. Interacts well with staff and others, medication compliant will continue to monitor for safety

## 2014-07-16 NOTE — Progress Notes (Signed)
   07/16/14 1200  Clinical Encounter Type  Visited With Patient  Visit Type Initial  Referral From Patient   Izabelle requested Bible and prayer.  Provided pastoral presence, reflective listening, prayer per request, and reported pt's request for her Bible from her locker to RN, who planned to retrieve it later.  [place], [place]

## 2014-07-16 NOTE — Progress Notes (Addendum)
Per Lattie Haw, patient accepted at [facility], to Dr. Lenna Sciara, pt can come tomorrow after 9am, call report at [phone]. RN Cyril Mourning informed.   Verlon Setting, [place] Disposition staff 07/16/2014 11:03 PM

## 2014-07-16 NOTE — ED Notes (Signed)
Pt upset, stating that the nurse has been snappy and had an attitude with the patient all night. Pt states she is trying to be good and that she is a grown woman who has bills to pay and she does not want to "turn up" like the pt next to her.  Pt states that when that pt in the next room "turned up" he was able to get popsicles and sugars. Pt is requesting ice cream at this time. Pt notified that ice cream as not given out at this time and that she would get food at breakfast. Pt states she does not want water when it was offered and that the nurse can just leave and "do your paper work".

## 2014-07-17 DIAGNOSIS — F603 Borderline personality disorder: Secondary | ICD-10-CM | POA: Diagnosis not present

## 2014-07-17 DIAGNOSIS — T1491 Suicide attempt: Secondary | ICD-10-CM

## 2014-07-17 DIAGNOSIS — R45851 Suicidal ideations: Secondary | ICD-10-CM | POA: Diagnosis not present

## 2014-07-17 DIAGNOSIS — F319 Bipolar disorder, unspecified: Secondary | ICD-10-CM | POA: Diagnosis not present

## 2014-07-17 DIAGNOSIS — F3181 Bipolar II disorder: Secondary | ICD-10-CM
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
## 2014-07-17 NOTE — ED Notes (Signed)
Attempted to [place] and left message at the  [facility] office for transport.

## 2014-07-17 NOTE — Progress Notes (Signed)
PT NOTE...   Order received , chart reviewed. Note pt sitting eating in her room near doorway. Spoke with nurse and nurse stated pt was leaving within the next 15 minutes or so, and no need or noticed skilled PT needs at this time. Sign off .  [address] [place], [place] Pager: [phone] 07/17/2014

## 2014-08-06 ENCOUNTER — Encounter: Payer: Self-pay | Admitting: Pulmonary Disease

## 2014-08-06 ENCOUNTER — Ambulatory Visit (INDEPENDENT_AMBULATORY_CARE_PROVIDER_SITE_OTHER): Payer: Medicare Other | Admitting: Pulmonary Disease

## 2014-08-06 VITALS — BP 110/78 | HR 89 | Ht 66.0 in | Wt 280.0 lb

## 2014-08-06 DIAGNOSIS — G4733 Obstructive sleep apnea (adult) (pediatric): Secondary | ICD-10-CM

## 2014-08-06 NOTE — Progress Notes (Deleted)
   Subjective:    Patient ID: Tonya [name], female    DOB: 1985-07-07, 29 y.o.   MRN: [id number]  HPI    Review of Systems  Constitutional: Negative for fever and unexpected weight change.  HENT: Positive for congestion, sore throat and trouble swallowing. Negative for dental problem, ear pain, nosebleeds, postnasal drip, rhinorrhea, sinus pressure and sneezing.   Eyes: Negative for redness and itching.  Respiratory: Positive for shortness of breath. Negative for cough, chest tightness and wheezing.   Cardiovascular: Negative for palpitations and leg swelling.  Gastrointestinal: Negative for nausea and vomiting.  Genitourinary: Negative for dysuria.  Musculoskeletal: Negative for joint swelling.  Skin: Negative for rash.  Neurological: Negative for headaches.  Hematological: Does not bruise/bleed easily.  Psychiatric/Behavioral: Negative for dysphoric mood. The patient is not nervous/anxious.        Objective:   Physical Exam        Assessment & Plan:
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
## 2014-08-06 NOTE — Patient Instructions (Signed)
Will arrange for sleep study Will call to arrange for follow up after sleep study reviewed 

## 2014-08-14 ENCOUNTER — Ambulatory Visit (HOSPITAL_BASED_OUTPATIENT_CLINIC_OR_DEPARTMENT_OTHER): Payer: Medicare Other | Attending: Pulmonary Disease

## 2014-08-14 VITALS — Ht 66.0 in | Wt 280.0 lb

## 2014-08-14 DIAGNOSIS — G4733 Obstructive sleep apnea (adult) (pediatric): Secondary | ICD-10-CM | POA: Diagnosis present

## 2014-08-14 DIAGNOSIS — R0683 Snoring: Secondary | ICD-10-CM | POA: Insufficient documentation

## 2014-08-20 ENCOUNTER — Encounter: Payer: Self-pay | Admitting: Pulmonary Disease

## 2014-08-20 ENCOUNTER — Telehealth: Payer: Self-pay | Admitting: Pulmonary Disease

## 2014-08-20 DIAGNOSIS — G4733 Obstructive sleep apnea (adult) (pediatric): Secondary | ICD-10-CM | POA: Insufficient documentation

## 2014-08-20 HISTORY — DX: Obstructive sleep apnea (adult) (pediatric): G47.33

## 2014-08-20 NOTE — Progress Notes (Signed)
Patient Name: Tonya [name], Tonya [name] Study Date: 08/14/2014 Gender: Female D.O.B: [birth year] Age (years): 29 Referring Provider: Chesley Mires MD, ABSM Height (inches): 21 Interpreting Physician: Chesley Mires MD, ABSM Weight (lbs): 280 RPSGT: Joni Reining BMI: 41 MRN: [id number] Neck Size: 16.00  CLINICAL INFORMATION Sleep Study Type: Split Night CPAP  Indication for sleep study: Assess for obstructive sleep apnea.  Epworth Sleepiness Score: 9  SLEEP STUDY TECHNIQUE As per the AASM Manual for the Scoring of Sleep and Associated Events v2.3 (April 2016) with a hypopnea requiring 4% desaturations.  The channels recorded and monitored were frontal, central and occipital EEG, electrooculogram (EOG), submentalis EMG (chin), nasal and oral airflow, thoracic and abdominal wall motion, anterior tibialis EMG, snore microphone, electrocardiogram, and pulse oximetry. Continuous positive airway pressure (CPAP) was initiated when the patient met split night criteria and was titrated according to treat sleep-disordered breathing.  MEDICATIONS Medications taken by the patient : reviewed in electronic medical record. Medications administered by patient during sleep study : No sleep medicine administered.  RESPIRATORY PARAMETERS Diagnostic  Total AHI (/hr): 30.1 RDI (/hr): 35.6 OA Index (/hr): 14.6 CA Index (/hr): 0.0 REM AHI (/hr): 38.6 NREM AHI (/hr): 29.1 Supine AHI (/hr): N/A Non-supine AHI (/hr): 30.11 Min O2 Sat (%): 73.00 Mean O2 (%): 92.02 Time below 88% (min): 8.3   Titration  Optimal Pressure (cm): 11 AHI at Optimal Pressure (/hr): 10.1 Min O2 at Optimal Pressure (%): 84.0 Supine % at Optimal (%): 100 Sleep % at Optimal (%): 95    SLEEP ARCHITECTURE The recording time for the entire night was 394.0 minutes.  During a baseline period of 191.9 minutes, the patient slept for 131.5 minutes in REM and nonREM, yielding a sleep efficiency of 68.5%. Sleep onset after lights out was 52.6 minutes  with a REM latency of 124.5 minutes. The patient spent 2.28% of the night in stage N1 sleep, 66.54% in stage N2 sleep, 20.53% in stage N3 and 10.65% in REM.  During the titration period of 188.5 minutes, the patient slept for 151.8 minutes in REM and nonREM, yielding a sleep efficiency of 80.5%. Sleep onset after CPAP initiation was 20.2 minutes with a REM latency of 36.5 minutes. The patient spent 4.28% of the night in stage N1 sleep, 47.09% in stage N2 sleep, 13.39% in stage N3 and 35.24% in REM.  CARDIAC DATA The 2 lead EKG demonstrated sinus rhythm. The mean heart rate was 88.12 beats per minute. Other EKG findings include: None.  LEG MOVEMENT DATA The total Periodic Limb Movements of Sleep (PLMS) were 1. The PLMS index was 0.21 .  IMPRESSIONS Severe obstructive sleep apnea occurred during the diagnostic portion of the study (AHI = 30.1/hour). An optimal PAP pressure was selected for this patient ( 11 cm of water) No significant central sleep apnea occurred during the diagnostic portion of the study (CAI = 0.0/hour). Moderate oxygen desaturation was noted during the diagnostic portion of the study (Min O2 =73.00%). The patient snored with Loud snoring volume during the diagnostic portion of the study. No cardiac abnormalities were noted during this study. Clinically significant periodic limb movements did not occur during sleep.  DIAGNOSIS Obstructive Sleep Apnea (327.23 [G47.33 ICD-10])  RECOMMENDATIONS Additional therapies include CPAP, oral appliance, and surgical assessment. She was fitted with a Medium size Fisher&Paykel Nasal Mask Eson mask. Avoid alcohol, sedatives and other CNS depressants that may worsen sleep apnea and disrupt normal sleep architecture. Sleep hygiene should be reviewed to assess factors that may improve sleep quality. Weight management  and regular exercise should be initiated or continued.   , MD [facility] 08/20/2014, 4:37  PM  

## 2014-08-20 NOTE — Telephone Encounter (Signed)
PSG 08/14/14 >> AHI 30.1, SaO2 low 73%.  CPAP 11 cm H2O.  Will have my nurse inform pt that sleep study shows severe sleep apnea.  Options are 1) CPAP now, 2) ROV first.  If She is agreeable to CPAP, then please send order for auto CPAP range 5 to 15 cm H2O with heated humidity and mask of choice.  Have download sent 1 month after starting CPAP and set up ROV 2 months after starting CPAP.

## 2014-08-24 NOTE — Telephone Encounter (Signed)
Results have been explained to patient, pt expressed understanding.  Order placed for CPAP start 2 month recall entered.  Nothing further needed.

## 2014-09-20 ENCOUNTER — Encounter (HOSPITAL_BASED_OUTPATIENT_CLINIC_OR_DEPARTMENT_OTHER): Payer: Self-pay

## 2014-10-26 ENCOUNTER — Encounter: Payer: Self-pay | Admitting: Pulmonary Disease

## 2014-10-26 ENCOUNTER — Ambulatory Visit (INDEPENDENT_AMBULATORY_CARE_PROVIDER_SITE_OTHER): Payer: Medicare Other | Admitting: Pulmonary Disease

## 2014-10-26 VITALS — BP 118/80 | HR 81 | Temp 97.8°F | Ht 66.0 in

## 2014-10-26 DIAGNOSIS — G4733 Obstructive sleep apnea (adult) (pediatric): Secondary | ICD-10-CM

## 2014-10-26 DIAGNOSIS — Z9989 Dependence on other enabling machines and devices: Principal | ICD-10-CM

## 2014-10-26 NOTE — Progress Notes (Signed)
Chief Complaint  Patient presents with  . Follow-up    pt states the CPAP is hard to get used to. pt states she doesnt even know shes doing it but she is taking the CPAP off in the middle of the night.  pt states she is trying to use it everyday, for atleast the 4 hours. DME: [facility]    History of Present Illness: Tonya [name] is a 29 y.o. female with OSA.  She is using her CPAP about 4 to 5 hrs per night.  Her mask comes off during the night, and she is not always aware of this.  She has nasal mask and this fits well.  She denies mouth leak.  She was started on new medication which makes her sleepy.  She will end up napping for 2 to 3 hrs during the day.  She has not been using CPAP when she naps.   TESTS: PSG 08/14/14 >> AHI 30.1, SaO2 low 73%.  CPAP 11 cm H2O.   PMhx >> Bipolar disease, Suicidal ideation  Past surgical hx, Medications, Allergies, Family hx, Social hx all reviewed.   Physical Exam: BP 118/80 mmHg  Pulse 81  Temp(Src) 97.8 F (36.6 C) (Oral)  Ht 5\' 6"  (1.676 m)  SpO2 95%  General - No distress ENT - No sinus tenderness, no oral exudate, no LAN, MP 3 Cardiac - s1s2 regular, no murmur Chest - No wheeze/rales/dullness Back - No focal tenderness Abd - Soft, non-tender Ext - No edema Neuro - Normal strength Skin - No rashes Psych - normal mood, and behavior   Assessment/Plan:  Obstructive sleep apnea. Plan: - discussed techniques to improve her compliance with CPAP - advised her to use CPAP whenever she is asleep, including naps  Obesity. Plan: - discussed techniques to assist with weight loss  Daytime sleepiness. Some of this could be related to her psychiatric medications in addition to sleep apnea. Plan: - she is to f/u with psychiatry for monitoring of her zyprexa and tegretol   Chesley Mires, MD [facility]/Critical [facility]/Sleep Pager:  [phone]

## 2014-10-26 NOTE — Patient Instructions (Signed)
Will get copy of CPAP report and call with results  Follow up in 6 months

## 2014-10-27 ENCOUNTER — Telehealth: Payer: Self-pay | Admitting: Pulmonary Disease

## 2014-10-27 NOTE — Telephone Encounter (Signed)
Download done and placed in Dr. Halford Chessman look at. Please advise thanks

## 2014-10-28 NOTE — Telephone Encounter (Signed)
Pt is aware of download results. Nothing further was needed. 

## 2014-10-28 NOTE — Telephone Encounter (Signed)
Auto CPAP 08/27/14 to 10/26/14 >> used on 26 of 61 nights with average 3 hrs and 27 min.  Average AHI is 2 with median CPAP 9 cm H2O and 95 th percentile CPAP 12 cm H20.   Will have my nurse inform pt that CPAP report shows good control of sleep apnea when she uses CPAP.  She needs to use CPAP for entire time she is asleep to get maximal benefit.

## 2015-02-02 DIAGNOSIS — F319 Bipolar disorder, unspecified: Secondary | ICD-10-CM | POA: Diagnosis not present

## 2015-02-16 DIAGNOSIS — F603 Borderline personality disorder: Secondary | ICD-10-CM | POA: Diagnosis not present

## 2015-02-16 DIAGNOSIS — F314 Bipolar disorder, current episode depressed, severe, without psychotic features: Secondary | ICD-10-CM | POA: Diagnosis not present

## 2015-03-08 DIAGNOSIS — F314 Bipolar disorder, current episode depressed, severe, without psychotic features: Secondary | ICD-10-CM | POA: Diagnosis not present

## 2015-03-08 DIAGNOSIS — F603 Borderline personality disorder: Secondary | ICD-10-CM | POA: Diagnosis not present

## 2015-05-13 ENCOUNTER — Ambulatory Visit: Payer: Self-pay | Admitting: Pulmonary Disease

## 2015-05-31 DIAGNOSIS — F314 Bipolar disorder, current episode depressed, severe, without psychotic features: Secondary | ICD-10-CM | POA: Diagnosis not present

## 2015-05-31 DIAGNOSIS — F603 Borderline personality disorder: Secondary | ICD-10-CM | POA: Diagnosis not present

## 2015-06-18 ENCOUNTER — Emergency Department (HOSPITAL_COMMUNITY)
Admission: EM | Admit: 2015-06-18 | Discharge: 2015-06-18 | Disposition: A | Discharge status: Home / Self Care | Payer: Medicare Other | Attending: Emergency Medicine | Admitting: Emergency Medicine

## 2015-06-18 ENCOUNTER — Encounter (HOSPITAL_COMMUNITY): Payer: Self-pay | Admitting: Emergency Medicine

## 2015-06-18 DIAGNOSIS — Z79899 Other long term (current) drug therapy: Secondary | ICD-10-CM | POA: Insufficient documentation

## 2015-06-18 DIAGNOSIS — J029 Acute pharyngitis, unspecified: Secondary | ICD-10-CM | POA: Diagnosis not present

## 2015-06-18 DIAGNOSIS — Z3040 Encounter for surveillance of contraceptives, unspecified: Secondary | ICD-10-CM | POA: Diagnosis not present

## 2015-06-18 DIAGNOSIS — Z915 Personal history of self-harm: Secondary | ICD-10-CM | POA: Diagnosis not present

## 2015-06-18 DIAGNOSIS — F329 Major depressive disorder, single episode, unspecified: Secondary | ICD-10-CM | POA: Insufficient documentation

## 2015-06-18 DIAGNOSIS — Z87440 Personal history of urinary (tract) infections: Secondary | ICD-10-CM | POA: Diagnosis not present

## 2015-06-18 LAB — RAPID STREP SCREEN (MED CTR MEBANE ONLY): STREPTOCOCCUS, GROUP A SCREEN (DIRECT): NEGATIVE

## 2015-06-18 MED ORDER — HYDROCODONE-ACETAMINOPHEN 7.5-325 MG/15ML PO SOLN: 10.0000 mL | Freq: Four times a day (QID) | ORAL | Status: DC | PRN

## 2015-06-18 MED ORDER — IBUPROFEN 800 MG PO TABS: 800.0000 mg | ORAL_TABLET | Freq: Three times a day (TID) | ORAL | Status: DC | PRN

## 2015-06-18 MED ORDER — KETOROLAC TROMETHAMINE 60 MG/2ML IM SOLN
60.0000 mg | Freq: Once | INTRAMUSCULAR | Status: AC
  Administered 2015-06-18: 60 mg via INTRAMUSCULAR
  Filled 2015-06-18: qty 2

## 2015-06-18 NOTE — ED Notes (Signed)
Pt sts sore throat x 3 days ago

## 2015-06-18 NOTE — ED Provider Notes (Signed)
CSN: [id number]     Arrival date & time 06/18/15  1821 History  By signing my name below, I, Randa Evens, attest that this documentation has been prepared under the direction and in the presence of [place], PA-C. Electronically Signed: Randa Evens, ED Scribe. 06/18/2015. 7:21 PM.    Chief Complaint  Patient presents with  . Sore Throat   The history is provided by the patient. No language interpreter was used.   HPI Comments: Tonya [name] is a 30 y.o. female who presents to the Emergency Department complaining of sore throat onset 3 days prior. Pt rates the severity of her pain 8/10 and describes it as a soreness.  Pt reports associated cough and subjective fever. Pt states that the pain is worse when swallowing and talking. Pt doesn't report any medications tried PTA. Denies rhinorrhea, congestion, ear pain or SOB.  Past Medical History  Diagnosis Date  . Bipolar 1 disorder ([place])   . UTI (lower urinary tract infection) 10/20/11    being treated, has 4 doses abx left  . Suicide attempt ([place])   . Transfusion of blood during current hospitalization     SUMMER 2013  . Depression   . OSA (obstructive sleep apnea) 08/20/2014   Past Surgical History  Procedure Laterality Date  . Amputation  06/29/2011    Procedure: AMPUTATION ABOVE KNEE;  Surgeon: Rozanna Box, MD;  Location: [facility];  Service: Orthopedics;  Laterality: Right;  abovve knee amputation of right leg  . I&d extremity  06/29/2011    Procedure: IRRIGATION AND DEBRIDEMENT EXTREMITY;  Surgeon: Rozanna Box, MD;  Location: [facility] [facility];  Service: Orthopedics;  Laterality: Right;  Irrigation and debridement of right forearm with tendon repair.  . Amputation  06/27/2011    Procedure: AMPUTATION ABOVE KNEE;  Surgeon: Rozanna Box, MD;  Location: [facility];  Service: Orthopedics;  Laterality: Right;  . External fixation leg  06/27/2011    Procedure: EXTERNAL FIXATION LEG;  Surgeon: Rozanna Box, MD;  Location: [facility];  Service:  Orthopedics;  Laterality: Left;  . Tibia im nail insertion  07/14/2011    Procedure: INTRAMEDULLARY (IM) NAIL TIBIAL;  Surgeon: Rozanna Box, MD;  Location: [facility];  Service: Orthopedics;  Laterality: Left;  . External fixation removal  07/14/2011    Procedure: REMOVAL EXTERNAL FIXATION LEG;  Surgeon: Rozanna Box, MD;  Location: [facility];  Service: Orthopedics;  Laterality: Left;  . Tibia im nail insertion  10/24/2011    Procedure: INTRAMEDULLARY (IM) NAIL TIBIAL;  Surgeon: Rozanna Box, MD;  Location: [facility];  Service: Orthopedics;  Laterality: Left;  . Hardware removal  10/24/2011    Procedure: HARDWARE REMOVAL;  Surgeon: Rozanna Box, MD;  Location: [facility];  Service: Orthopedics;  Laterality: Left;  left tibial   . Nose surgery      BROKEN     . Tibia im nail insertion Left 04/04/2012    Procedure: INTRAMEDULLARY (IM) NAIL TIBIAL;  Surgeon: Rozanna Box, MD;  Location: [facility];  Service: Orthopedics;  Laterality: Left;  . Hardware removal Left 04/04/2012    Procedure: HARDWARE REMOVAL;  Surgeon: Rozanna Box, MD;  Location: [facility];  Service: Orthopedics;  Laterality: Left;   Family History  Problem Relation Age of Onset  . Allergies Mother   . Allergies Other     siblings  . Asthma Mother   . Asthma Other     siblings   Social History  Substance  Use Topics  . Smoking status: Never Smoker   . Smokeless tobacco: None  . Alcohol Use: No   OB History    No data available     Review of Systems  Constitutional: Positive for fever.  HENT: Positive for sore throat. Negative for congestion, ear pain, rhinorrhea and trouble swallowing.   Respiratory: Positive for cough. Negative for shortness of breath.   Musculoskeletal: Negative for neck stiffness.  Skin: Negative for color change.  Allergic/Immunologic: Negative for immunocompromised state.  Hematological: Does not bruise/bleed easily.  Psychiatric/Behavioral: Negative for self-injury.      Allergies  Review of  patient's allergies indicates no known allergies.  Home Medications   Prior to Admission medications   Medication Sig Start Date End Date Taking? Authorizing Provider  albuterol (PROVENTIL HFA;VENTOLIN HFA) 108 (90 BASE) MCG/ACT inhaler Inhale 2 puffs into the lungs every 6 (six) hours as needed for wheezing.    Historical Provider, MD  carbamazepine (TEGRETOL) 200 MG tablet Take 200 mg by mouth 2 (two) times daily.    Historical Provider, MD  docusate sodium 100 MG CAPS Take 100 mg by mouth 2 (two) times daily. 04/05/12   Ainsley Spinner, PA-C  OLANZapine (ZYPREXA) 10 MG tablet Take 10 mg by mouth daily.  05/06/14   Historical Provider, MD   BP 156/108 mmHg  Pulse 90  Temp(Src) 98.9 F (37.2 C) (Oral)  Resp 18  SpO2 97%   Physical Exam  Constitutional: She appears well-developed and well-nourished. No distress.  HENT:  Head: Normocephalic and atraumatic.  Mouth/Throat: Uvula is midline. Mucous membranes are not dry. No trismus in the jaw. No uvula swelling. Posterior oropharyngeal edema and posterior oropharyngeal erythema present. No oropharyngeal exudate or tonsillar abscesses.  Tonsils are symmetric  Eyes: Conjunctivae are normal.  Neck: Neck supple.  Cardiovascular: Normal rate and regular rhythm.   Pulmonary/Chest: Effort normal and breath sounds normal. No respiratory distress. She has no wheezes. She has no rales.  Lymphadenopathy:    She has cervical adenopathy (anterior).  Neurological: She is alert.  Skin: She is not diaphoretic.  Nursing note and vitals reviewed.   ED Course  Procedures (including critical care time) DIAGNOSTIC STUDIES: Oxygen Saturation is 97% on RA, normal by my interpretation.    COORDINATION OF CARE: 7:21 PM-Discussed treatment plan with pt at bedside and pt agreed to plan.     Labs Review Labs Reviewed  RAPID STREP SCREEN (NOT AT [facility])  CULTURE, GROUP A STREP [facility])    Imaging Review No results found.    EKG Interpretation None       MDM   Final diagnoses:  Pharyngitis    Afebrile, nontoxic patient with sore throat.  Strep screen negative.  Culture pending.  No airway concerns.  Doubt peritonsillar abscess.   D/C home with pain medication, PCP follow up.  Discussed result, findings, treatment, and follow up  with patient.  Pt given return precautions.  Pt verbalizes understanding and agrees with plan.        I personally performed the services described in this documentation, which was scribed in my presence. The recorded information has been reviewed and is accurate.      Clayton Bibles, PA-C 06/18/15 [id number]  Charlesetta Shanks, MD 06/19/15 [phone]

## 2015-06-18 NOTE — Discharge Instructions (Signed)
Read the information below.  Use the prescribed medication as directed.  Please discuss all new medications with your pharmacist.  Do not take additional tylenol while taking the prescribed pain medication to avoid overdose.  You may return to the Emergency Department at any time for worsening condition or any new symptoms that concern you.    If you develop high fevers, difficulty swallowing or breathing, or you are unable to tolerate fluids by mouth, return to the ER immediately for a recheck.        Pharyngitis Pharyngitis is redness, pain, and swelling (inflammation) of your pharynx.  CAUSES  Pharyngitis is usually caused by infection. Most of the time, these infections are from viruses (viral) and are part of a cold. However, sometimes pharyngitis is caused by bacteria (bacterial). Pharyngitis can also be caused by allergies. Viral pharyngitis may be spread from person to person by coughing, sneezing, and personal items or utensils (cups, forks, spoons, toothbrushes). Bacterial pharyngitis may be spread from person to person by more intimate contact, such as kissing.  SIGNS AND SYMPTOMS  Symptoms of pharyngitis include:   Sore throat.   Tiredness (fatigue).   Low-grade fever.   Headache.  Joint pain and muscle aches.  Skin rashes.  Swollen lymph nodes.  Plaque-like film on throat or tonsils (often seen with bacterial pharyngitis). DIAGNOSIS  Your health care provider will ask you questions about your illness and your symptoms. Your medical history, along with a physical exam, is often all that is needed to diagnose pharyngitis. Sometimes, a rapid strep test is done. Other lab tests may also be done, depending on the suspected cause.  TREATMENT  Viral pharyngitis will usually get better in 3-4 days without the use of medicine. Bacterial pharyngitis is treated with medicines that kill germs (antibiotics).  HOME CARE INSTRUCTIONS   Drink enough water and fluids to keep your urine  clear or pale yellow.   Only take over-the-counter or prescription medicines as directed by your health care provider:   If you are prescribed antibiotics, make sure you finish them even if you start to feel better.   Do not take aspirin.   Get lots of rest.   Gargle with 8 oz of salt water ( tsp of salt per 1 qt of water) as often as every 1-2 hours to soothe your throat.   Throat lozenges (if you are not at risk for choking) or sprays may be used to soothe your throat. SEEK MEDICAL CARE IF:   You have large, tender lumps in your neck.  You have a rash.  You cough up green, yellow-brown, or bloody spit. SEEK IMMEDIATE MEDICAL CARE IF:   Your neck becomes stiff.  You drool or are unable to swallow liquids.  You vomit or are unable to keep medicines or liquids down.  You have severe pain that does not go away with the use of recommended medicines.  You have trouble breathing (not caused by a stuffy nose). MAKE SURE YOU:   Understand these instructions.  Will watch your condition.  Will get help right away if you are not doing well or get worse.   This information is not intended to replace advice given to you by your health care provider. Make sure you discuss any questions you have with your health care provider.   Document Released: 01/16/2005 Document Revised: 11/06/2012 Document Reviewed: 09/23/2012 Elsevier Interactive Patient Education 2016  [place] [employer] is a  great source of information about community services available.  Access by dialing 2-1-1 from anywhere in [place], or by website -  CustodianSupply.fi.   Other Local Resources (Updated 01/2015)  [place]    Phone Number and Address  [place] medical care - 1st and 3rd Saturday of every month  Must not qualify for public or private insurance and must have limited income  [phone] [address], [place]  Child care  Emergency assistance for housing and [employer]  Medicaid [phone] [address], [place] [address]   [facility] Department  Low-cost medical care for children, communicable diseases, sexually-transmitted diseases, immunizations, maternity care, womens health and family planning [phone] [address], [place] [address]  [facility]   Medication assistance for [facility] residents  Must meet income requirements [phone] [place], [place].    [place]  Child care  Emergency assistance for housing and [employer]  Medicaid [id number] [address] [place], [place] [address]  [facility] [place]   Low-cost medical care,   Monday through Friday, 9 am to 6 pm.   Accepts Medicare/Medicaid, and self-pay [phone] [name] [place], [place] [address]  [facility] for [place] care - Monday through Friday, 8:30 am - 5:30 pm  Accepts Medicaid and self-pay [phone] [address], [place], [place] [address]   [facility]  Primary medical care, including for those with sickle cell disease  Accepts Medicare, Medicaid, insurance and self-pay [phone] [place], [place]  [facility]   Primary medical care  Accepts Medicare, [place], insurance and self-pay [phone] [address], [place], [place] [address]   [facility] Department of Social Services  Child care  Emergency assistance for housing and [employer]  Medicaid [phone] [address] [place], [place] [address]  [place] Department of Health and [employer]  Child care  Emergency assistance for housing and [employer]  Medicaid [phone] [place], [place] [address]   [facility] Medication Assistance Program  Medication assistance for [facility] residents with no insurance only  Must have a primary care doctor [phone] [address], [place], [place]  [facility]   Primary medical care   [place], [place], insurance  [phone] [address] [place], [place]  [facility]   Medication assistance [phone]  [facility]   Primary medical care  Accepts Medicare, [place], insurance and self-pay [phone] [address], [place] [address]  [facility]   Primary medical care  Accepts Medicare, [place], insurance and self-pay [phone] [address], [place] [address]  [facility]  For [place] residents between the ages of 30 and 15 who do not have any form of health insurance, Medicare, [place], or [place] benefits.  Services are provided free of charge to uninsured patients who fall within federal poverty guidelines.    Hours: Tuesdays and Thursdays, 4:15 - 8 pm [phone] [address], [place], [place] [address]  [facility]     Primary medical care  Dental care  Nutritional counseling  Pharmacy  Accepts Medicaid, [place],  most insurance.  Fees are adjusted based on ability to pay.   [place], [place] [address], [place], [facility], [place], [place] [employer] [facility] [place], [place]  [facility]  Womens health and  family planning [phone] [address] [place], [place] care  Emergency assistance for housing and [employer]  Medicaid [phone] [address] [place], [place] [address]   [facility]    Ages 63 and older  Hours: Mondays and Thursdays, 7:00 am - 9:00 am Patients are seen on a first come, first served basis. [phone], ext. [place], [place]  Child care  Emergency assistance for housing and [employer]  Medicaid [phone] [address], [place] [address]  [facility]  Medication assistance  Rental assistance  Food pantry  Medication assistance  Housing assistance  Emergency food distribution  Utility assistance [place], [place]  [address]. [place], [place] [address] Hours: Tuesdays and Thursdays from 9am - 12 noon by appointment only  [place], [place] [address]  [facility] private insurance, [place], and [place].  Payment is based on a sliding scale for those without insurance.  Hours: Mondays, Tuesdays and Thursdays, 8:30 am - 5:30 pm.   [phone] [place], [place]  [facility] - Family Medicine at [facility], [place], and [place].  Payment is based on a sliding scale for those without insurance. [phone] [address] [place], [place]  [facility] - Pediatrics at [address] Scient[address], research (medical), [employer], and [place].  Payment is based on a sliding scale for those without insurance [phone] [address] [place], [employer], [place]  [facility] - Pediatrics at [employer], [place], and [place].  Payment is based on a sliding scale for those without insurance. [phone] [place], [place]  [facility] - Pediatrics at [facility], [place], and [place].  Payment is based on a sliding scale for those without insurance. [phone], ext. [name] [place], [place].    [place] care.  Accepts Medicaid and self-pay. [place], [place]

## 2015-06-18 NOTE — ED Notes (Signed)
PA at bedside to assess pt.

## 2015-06-20 LAB — CULTURE, GROUP A STREP (THRC)

## 2015-08-16 ENCOUNTER — Encounter: Payer: Self-pay | Admitting: Pulmonary Disease

## 2015-08-16 ENCOUNTER — Ambulatory Visit (INDEPENDENT_AMBULATORY_CARE_PROVIDER_SITE_OTHER): Payer: Medicare Other | Admitting: Pulmonary Disease

## 2015-08-16 VITALS — BP 120/86 | HR 94 | Ht 66.0 in | Wt 274.0 lb

## 2015-08-16 DIAGNOSIS — Z9989 Dependence on other enabling machines and devices: Principal | ICD-10-CM

## 2015-08-16 DIAGNOSIS — G4733 Obstructive sleep apnea (adult) (pediatric): Secondary | ICD-10-CM | POA: Diagnosis not present

## 2015-08-16 NOTE — Progress Notes (Signed)
Current Outpatient Prescriptions on File Prior to Visit  Medication Sig  . albuterol (PROVENTIL HFA;VENTOLIN HFA) 108 (90 BASE) MCG/ACT inhaler Inhale 2 puffs into the lungs every 6 (six) hours as needed for wheezing.  [place] HYDROcodone-acetaminophen (HYCET) 7.5-325 mg/15 ml solution Take 10 mLs by mouth every 6 (six) hours as needed for severe pain.  Mar[place] Kitchen ibuprofen (ADVIL,MOTRIN) 800 MG tablet Take 1 tablet (800 mg total) by mouth every 8 (eight) hours as needed for mild pain or moderate pain.   No current facility-administered medications on file prior to visit.    Chief Complaint  Patient presents with  . Follow-up    Needs new CPAP machine - current machine has water damage and insurance will not cover a new machine. Has not used CPAP x 1 month. Pt states that she can tell a big difference while being off the machine, not sleeping well at night. Snoring.     Sleep tests PSG 08/14/14 >> AHI 30.1, SaO2 low 73%.  CPAP 11 cm H2O. Auto CPAP 08/27/14 to 10/26/14 >> used on 26 of 61 nights with average 3 hrs and 27 min. Average AHI is 2 with median CPAP 9 cm H2O and 95 th percentile CPAP 12 cm H20.  Past medical history Bipolar disease, Suicidal ideation  Past surgical hx, Allergies, Family hx, Social hx all reviewed.  Vital signs BP 120/86 mmHg  Pulse 94  Ht 5\' 6"  (1.676 m)  Wt 274 lb (124.286 kg)  BMI 44.25 kg/m2  SpO2 96%  History of Present Illness: Tonya [name] is a 30 y.o. female with OSA.  She was sleeping better with CPAP.  Her machine fell in water and is not working.  She was told by her DME that this was her fault, insurance wouldn't pay for a new machine, and she would need to pay $450 dollars to get repaired.  She is having more trouble with her sleep now.  She is waking up during the night and finds she is sleeping sitting up.  She is more tired during the day.  Physical Exam:  General - No distress ENT - No sinus tenderness, no oral exudate, no LAN, MP 3 Cardiac - s1s2  regular, no murmur Chest - No wheeze/rales/dullness Back - No focal tenderness Abd - Soft, non-tender Ext - prosthetic leg Neuro - Normal strength Skin - No rashes Psych - normal mood, and behavior   Assessment/Plan:  Obstructive sleep apnea. - will try to get her machine repaired or replaced - if unsuccessful, then could try to get oral appliance  Obesity. - discussed techniques to assist with weight loss  Leg prosthesis. - she doesn't have a PCP >> I signed her script for replacement liner for prosthesis   Patient Instructions  Will try to get your CPAP repaired or replaced   Follow up in 6 months    Chesley Mires, MD [place] [facility]/Critical Care/Sleep Pager:  [phone] 08/16/2015, 3:00 PM

## 2015-08-16 NOTE — Patient Instructions (Signed)
Will try to get your CPAP repaired or replaced   Follow up in 6 months

## 2015-08-25 ENCOUNTER — Telehealth: Payer: Self-pay | Admitting: Pulmonary Disease

## 2015-08-25 DIAGNOSIS — F314 Bipolar disorder, current episode depressed, severe, without psychotic features: Secondary | ICD-10-CM | POA: Diagnosis not present

## 2015-08-25 DIAGNOSIS — F603 Borderline personality disorder: Secondary | ICD-10-CM | POA: Diagnosis not present

## 2015-08-25 NOTE — Telephone Encounter (Signed)
Spoke with Maudie Mercury @ [facility] and she states that [facility] is requesting signed SST interpretation. Printed and faxed. She will let us know if they need anything else. Nothing further needed at this time.

## 2015-11-16 ENCOUNTER — Telehealth: Payer: Self-pay | Admitting: Pulmonary Disease

## 2015-11-16 NOTE — Telephone Encounter (Signed)
lmtcb X1 for [place] at the [facility].

## 2015-11-17 DIAGNOSIS — L814 Other melanin hyperpigmentation: Secondary | ICD-10-CM | POA: Diagnosis not present

## 2015-11-17 DIAGNOSIS — L853 Xerosis cutis: Secondary | ICD-10-CM | POA: Diagnosis not present

## 2015-11-17 DIAGNOSIS — D2362 Other benign neoplasm of skin of left upper limb, including shoulder: Secondary | ICD-10-CM | POA: Diagnosis not present

## 2015-11-17 NOTE — Telephone Encounter (Signed)
lmtcb x2 for [facility].

## 2015-11-18 ENCOUNTER — Ambulatory Visit (INDEPENDENT_AMBULATORY_CARE_PROVIDER_SITE_OTHER): Payer: Medicare Other | Admitting: Orthopedic Surgery

## 2015-11-18 DIAGNOSIS — L97111 Non-pressure chronic ulcer of right thigh limited to breakdown of skin: Secondary | ICD-10-CM

## 2015-11-18 DIAGNOSIS — Z89611 Acquired absence of right leg above knee: Secondary | ICD-10-CM

## 2015-11-18 NOTE — Telephone Encounter (Signed)
Called and spoke with [facility], states that they are no longer having this issue and that nothing further is needed.  Will sign off.

## 2015-11-23 ENCOUNTER — Telehealth (INDEPENDENT_AMBULATORY_CARE_PROVIDER_SITE_OTHER): Payer: Self-pay

## 2015-11-23 NOTE — Telephone Encounter (Signed)
Pt requesting rx for wheelchair

## 2015-11-25 DIAGNOSIS — F603 Borderline personality disorder: Secondary | ICD-10-CM | POA: Diagnosis not present

## 2015-11-25 DIAGNOSIS — F314 Bipolar disorder, current episode depressed, severe, without psychotic features: Secondary | ICD-10-CM | POA: Diagnosis not present

## 2015-11-25 NOTE — Telephone Encounter (Signed)
PT. Called back about Rx of wheelchair.

## 2015-12-02 ENCOUNTER — Other Ambulatory Visit (INDEPENDENT_AMBULATORY_CARE_PROVIDER_SITE_OTHER): Payer: Self-pay

## 2015-12-02 NOTE — Telephone Encounter (Signed)
rx written for wheelchair [facility]. Pt requested that rx is faxed to her home and this will be put in the mail tomorrow.

## 2015-12-03 ENCOUNTER — Other Ambulatory Visit (INDEPENDENT_AMBULATORY_CARE_PROVIDER_SITE_OTHER): Payer: Self-pay

## 2015-12-03 NOTE — Telephone Encounter (Signed)
This has been done.

## 2015-12-07 NOTE — Telephone Encounter (Signed)
Tiffany from [facility] called stating that they need the last office note faxed to them. Patient is trying to get her wheelchair through them.   Contact Info: [phone] Fax: [phone]

## 2015-12-07 NOTE — Telephone Encounter (Signed)
Office note from srs visit on 11/18/15 faxed to facility. Pt has requested urgent need bc wheelchair is broken.

## 2016-01-17 DIAGNOSIS — F314 Bipolar disorder, current episode depressed, severe, without psychotic features: Secondary | ICD-10-CM | POA: Diagnosis not present

## 2016-01-17 DIAGNOSIS — F603 Borderline personality disorder: Secondary | ICD-10-CM | POA: Diagnosis not present

## 2016-02-19 ENCOUNTER — Ambulatory Visit (HOSPITAL_COMMUNITY)
Admission: EM | Admit: 2016-02-19 | Discharge: 2016-02-19 | Disposition: A | Discharge status: Home / Self Care | Payer: Medicare Other | Attending: Family Medicine | Admitting: Family Medicine

## 2016-02-19 ENCOUNTER — Encounter (HOSPITAL_COMMUNITY): Payer: Self-pay

## 2016-02-19 DIAGNOSIS — R21 Rash and other nonspecific skin eruption: Secondary | ICD-10-CM | POA: Diagnosis not present

## 2016-02-19 MED ORDER — DOXYCYCLINE HYCLATE 100 MG PO TABS: 100.0000 mg | ORAL_TABLET | Freq: Two times a day (BID) | ORAL | 0 refills | Status: DC

## 2016-02-19 MED ORDER — CLOTRIMAZOLE-BETAMETHASONE 1-0.05 % EX CREA: TOPICAL_CREAM | CUTANEOUS | 0 refills | Status: DC

## 2016-02-19 NOTE — Discharge Instructions (Signed)
Follow-up with your primary care provider if rash is not improved after 3-5 days.
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
## 2016-02-19 NOTE — ED Triage Notes (Signed)
Patient presents to [facility] with rash on right thigh x2 days, pt has applied ice due to rash is hot to touch, pt denies any injury

## 2016-02-22 ENCOUNTER — Ambulatory Visit: Payer: Medicare Other | Admitting: Pulmonary Disease

## 2016-02-22 DIAGNOSIS — F603 Borderline personality disorder: Secondary | ICD-10-CM | POA: Diagnosis not present

## 2016-02-22 DIAGNOSIS — F314 Bipolar disorder, current episode depressed, severe, without psychotic features: Secondary | ICD-10-CM | POA: Diagnosis not present

## 2016-02-24 DIAGNOSIS — L259 Unspecified contact dermatitis, unspecified cause: Secondary | ICD-10-CM | POA: Diagnosis not present

## 2016-02-27 ENCOUNTER — Encounter: Payer: Self-pay | Admitting: Adult Health

## 2016-02-29 ENCOUNTER — Encounter: Payer: Self-pay | Admitting: Adult Health

## 2016-02-29 ENCOUNTER — Ambulatory Visit (INDEPENDENT_AMBULATORY_CARE_PROVIDER_SITE_OTHER): Payer: Medicare Other | Admitting: Adult Health

## 2016-02-29 VITALS — BP 116/64 | HR 74 | Temp 97.4°F | Ht 65.0 in

## 2016-02-29 DIAGNOSIS — G4733 Obstructive sleep apnea (adult) (pediatric): Secondary | ICD-10-CM | POA: Diagnosis not present

## 2016-02-29 MED ORDER — ALBUTEROL SULFATE HFA 108 (90 BASE) MCG/ACT IN AERS: 2.0000 | INHALATION_SPRAY | Freq: Four times a day (QID) | RESPIRATORY_TRACT | 3 refills | Status: DC | PRN

## 2016-02-29 NOTE — Assessment & Plan Note (Signed)
Severe OSA well controlled on CPAP   Plan  Patient Instructions  Keep up good work with CPAP  Wear each night .  Work on weight loss.  Order new mask -size medium  Follow up with Dr. Halford Chessman  In 1 year and As needed

## 2016-02-29 NOTE — Patient Instructions (Addendum)
Keep up good work with CPAP  Wear each night .  Work on weight loss.  Order new mask -size medium  Follow up with Dr. Halford Chessman  In 1 year and As needed

## 2016-02-29 NOTE — Progress Notes (Signed)
@Patient  ID: Tonya [name], female    DOB: 17-Jan-1986, 31 y.o.   MRN: [id number]  Chief Complaint  Patient presents with  . Follow-up    OSA     Referring provider: No ref. provider found  HPI: 31 yo female followed for OSA   TEST  PSG 08/14/14 >> AHI 30.1, SaO2 low 73%.  CPAP 11 cm H2O. Auto CPAP 08/27/14 to 10/26/14 >> used on 26 of 61 nights with average 3 hrs and 27 min. Average AHI is 2 with median CPAP 9 cm H2O and 95 th percentile CPAP 12 cm H20.'  02/29/2016 Follow up : OSA  Pt returns 6 month follow up for severe OSA .  She says she is doing well on C Pap. Feels rested with no significant daytime sleepiness. She is wearing her C Pap machine. Most nights. She gets in about 6-7 hours. Download shows excellent compliance with average usage at 7 hours. AHI 0.6. Positive leaks. Patient does state that she feels she needs a bigger mask. She currently has a small facemask. Patient says she feels a leaks intermittently during the night.   No Known Allergies  Immunization History  Administered Date(s) Administered  . Influenza Split 10/26/2011  . Influenza,inj,Quad PF,36+ Mos 10/30/2013  . Pneumococcal Polysaccharide-23 10/26/2011    Past Medical History:  Diagnosis Date  . Bipolar 1 disorder ([place])   . Depression   . OSA (obstructive sleep apnea) 08/20/2014  . Suicide attempt   . Transfusion of blood during current hospitalization    SUMMER 2013  . UTI (lower urinary tract infection) 10/20/11   being treated, has 4 doses abx left    Tobacco History: History  Smoking Status  . Never Smoker  Smokeless Tobacco  . Never Used   Counseling given: Not Answered   Outpatient Encounter Prescriptions as of 02/29/2016  Medication Sig  . albuterol (PROVENTIL HFA;VENTOLIN HFA) 108 (90 Base) MCG/ACT inhaler Inhale 2 puffs into the lungs every 6 (six) hours as needed for wheezing.  . Brexpiprazole (REXULTI) 2 MG TABS Take 1 tablet by mouth daily.  . clotrimazole-betamethasone  (LOTRISONE) cream Apply to affected area 2 times daily prn  . desogestrel-ethinyl estradiol (AZURETTE) 0.15-0.02/0.01 MG (21/5) tablet Take 1 tablet by mouth daily.  Marland Kitchen ibuprofen (ADVIL,MOTRIN) 800 MG tablet Take 1 tablet (800 mg total) by mouth every 8 (eight) hours as needed for mild pain or moderate pain.  . Multiple Vitamins-Minerals (MULTIVITAMIN WITH MINERALS) tablet Take 1 tablet by mouth daily.  . [DISCONTINUED] albuterol (PROVENTIL HFA;VENTOLIN HFA) 108 (90 BASE) MCG/ACT inhaler Inhale 2 puffs into the lungs every 6 (six) hours as needed for wheezing.  Marland Kitchen doxycycline (VIBRA-TABS) 100 MG tablet Take 1 tablet (100 mg total) by mouth 2 (two) times daily. (Patient not taking: Reported on 02/29/2016)  . HYDROcodone-acetaminophen (HYCET) 7.5-325 mg/15 ml solution Take 10 mLs by mouth every 6 (six) hours as needed for severe pain. (Patient not taking: Reported on 02/29/2016)   No facility-administered encounter medications on file as of 02/29/2016.      Review of Systems  Constitutional:   No  weight loss, night sweats,  Fevers, chills,  +fatigue, or  lassitude.  HEENT:   No headaches,  Difficulty swallowing,  Tooth/dental problems, or  Sore throat,                No sneezing, itching, ear ache, nasal congestion, post nasal drip,   CV:  No chest pain,  Orthopnea, PND, swelling in lower extremities,  anasarca, dizziness, palpitations, syncope.   GI  No heartburn, indigestion, abdominal pain, nausea, vomiting, diarrhea, change in bowel habits, loss of appetite, bloody stools.   Resp: No shortness of breath with exertion or at rest.  No excess mucus, no productive cough,  No non-productive cough,  No coughing up of blood.  No change in color of mucus.  No wheezing.  No chest wall deformity  Skin: no rash or lesions.  GU: no dysuria, change in color of urine, no urgency or frequency.  No flank pain, no hematuria   MS:  No joint pain or swelling.  No decreased range of motion.  No back  pain.    Physical Exam  BP 116/64   Pulse 74   Temp 97.4 F (36.3 C) (Oral)   Ht 5\' 5"  (1.651 m)   LMP 02/16/2016   SpO2 98%   GEN: A/Ox3; pleasant , NAD, obese , in wc    HEENT:  [place]/AT,  EACs-clear, TMs-wnl, NOSE-clear, THROAT-clear, no lesions, no postnasal drip or exudate noted. Class 2-3 MP airway   NECK:  Supple w/ fair ROM; no JVD; normal carotid impulses w/o bruits; no thyromegaly or nodules palpated; no lymphadenopathy.    RESP  Clear  P & A; w/o, wheezes/ rales/ or rhonchi. no accessory muscle use, no dullness to percussion  CARD:  RRR, no m/r/g, no peripheral edema, pulses intact, no cyanosis or clubbing.  GI:   Soft & nt; nml bowel sounds; no organomegaly or masses detected.   Musco: Warm bil, R AKA /prosthetic   Neuro: alert, no focal deficits noted.    Skin: Warm, no lesions or rashes  Psych:  No change in mood or affect. No depression or anxiety.  No memory loss.  Lab Results:  CBC    Component Value Date/Time   WBC 6.7 07/13/2014 2006   RBC 4.75 07/13/2014 2006   HGB 13.1 07/13/2014 2006   HCT 40.9 07/13/2014 2006   PLT 251 07/13/2014 2006   MCV 86.1 07/13/2014 2006   MCH 27.6 07/13/2014 2006   MCHC 32.0 07/13/2014 2006   RDW 14.5 07/13/2014 2006   LYMPHSABS 3.8 04/03/2012 1243   MONOABS 0.5 04/03/2012 1243   EOSABS 0.2 04/03/2012 1243   BASOSABS 0.0 04/03/2012 1243    BMET    Component Value Date/Time   NA 138 07/13/2014 2006   K 3.7 07/13/2014 2006   CL 104 07/13/2014 2006   CO2 28 07/13/2014 2006   GLUCOSE 105 (H) 07/13/2014 2006   BUN 8 07/13/2014 2006   CREATININE 0.73 07/13/2014 2006   CALCIUM 9.0 07/13/2014 2006   GFRNONAA >60 07/13/2014 2006   GFRAA >60 07/13/2014 2006    BNP No results found for: BNP  ProBNP No results found for: PROBNP  Imaging: No results found.   Assessment & Plan:   OSA (obstructive sleep apnea) Severe OSA well controlled on CPAP   Plan  Patient Instructions  Keep up good work with CPAP   Wear each night .  Work on weight loss.  Order new mask -size medium  Follow up with Dr. Halford Chessman  In 1 year and As needed         Rexene Edison, NP 02/29/2016

## 2016-03-01 NOTE — Progress Notes (Signed)
I have reviewed and agree with assessment/plan.  Chesley Mires, MD [facility] Pulmonary/Critical Care 03/01/2016, 3:47 PM Pager:  [phone]

## 2016-03-30 DIAGNOSIS — F603 Borderline personality disorder: Secondary | ICD-10-CM | POA: Diagnosis not present

## 2016-03-30 DIAGNOSIS — F314 Bipolar disorder, current episode depressed, severe, without psychotic features: Secondary | ICD-10-CM | POA: Diagnosis not present

## 2016-05-16 DIAGNOSIS — F314 Bipolar disorder, current episode depressed, severe, without psychotic features: Secondary | ICD-10-CM | POA: Diagnosis not present

## 2016-05-16 DIAGNOSIS — F603 Borderline personality disorder: Secondary | ICD-10-CM | POA: Diagnosis not present

## 2016-05-17 ENCOUNTER — Telehealth (INDEPENDENT_AMBULATORY_CARE_PROVIDER_SITE_OTHER): Payer: Self-pay | Admitting: *Deleted

## 2016-05-17 NOTE — Telephone Encounter (Signed)
Faxed to [facility] at [facility] [phone]

## 2016-05-17 NOTE — Telephone Encounter (Signed)
Pt called for RX for shrinkers, pt asking if she needs to make an appt or if the RX can be written without it.

## 2016-06-29 ENCOUNTER — Emergency Department (HOSPITAL_COMMUNITY)
Admission: EM | Admit: 2016-06-29 | Discharge: 2016-06-29 | Disposition: A | Discharge status: Home / Self Care | Payer: No Typology Code available for payment source | Attending: Emergency Medicine | Admitting: Emergency Medicine

## 2016-06-29 ENCOUNTER — Encounter (HOSPITAL_COMMUNITY): Payer: Self-pay | Admitting: Nurse Practitioner

## 2016-06-29 DIAGNOSIS — Y9389 Activity, other specified: Secondary | ICD-10-CM | POA: Insufficient documentation

## 2016-06-29 DIAGNOSIS — S199XXA Unspecified injury of neck, initial encounter: Secondary | ICD-10-CM | POA: Insufficient documentation

## 2016-06-29 DIAGNOSIS — Z79899 Other long term (current) drug therapy: Secondary | ICD-10-CM | POA: Insufficient documentation

## 2016-06-29 DIAGNOSIS — M549 Dorsalgia, unspecified: Secondary | ICD-10-CM | POA: Insufficient documentation

## 2016-06-29 DIAGNOSIS — Y92410 Unspecified street and highway as the place of occurrence of the external cause: Secondary | ICD-10-CM | POA: Insufficient documentation

## 2016-06-29 DIAGNOSIS — Y999 Unspecified external cause status: Secondary | ICD-10-CM | POA: Insufficient documentation

## 2016-06-29 MED ORDER — CYCLOBENZAPRINE HCL 10 MG PO TABS: 10.0000 mg | ORAL_TABLET | Freq: Two times a day (BID) | ORAL | 0 refills | Status: DC | PRN

## 2016-06-29 MED ORDER — MELOXICAM 7.5 MG PO TABS: 7.5000 mg | ORAL_TABLET | Freq: Every day | ORAL | 0 refills | Status: DC

## 2016-06-29 MED ORDER — CYCLOBENZAPRINE HCL 10 MG PO TABS
5.0000 mg | ORAL_TABLET | Freq: Once | ORAL | Status: AC
  Administered 2016-06-29: 5 mg via ORAL
  Filled 2016-06-29: qty 1

## 2016-06-29 MED ORDER — IBUPROFEN 800 MG PO TABS
800.0000 mg | ORAL_TABLET | Freq: Once | ORAL | Status: AC
  Administered 2016-06-29: 800 mg via ORAL
  Filled 2016-06-29: qty 1

## 2016-06-29 NOTE — Discharge Instructions (Signed)
Please return to the emergency department if symptoms worsen or if he develops new symptoms. Please do not drive or go to work after taking Flexeril. Please do not take ibuprofen if you are taking the meloxicam. Muscle strains can take some time to heal. If symptoms persist for more than one week, you can call the number to get established with a primary care provider for follow-up.
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
## 2016-07-10 ENCOUNTER — Telehealth (INDEPENDENT_AMBULATORY_CARE_PROVIDER_SITE_OTHER): Payer: Self-pay | Admitting: Orthopedic Surgery

## 2016-07-10 NOTE — Telephone Encounter (Signed)
Faxed last office note to [phone]. This was from 10/2015, patient has not been seen in the practice since than.

## 2016-07-10 NOTE — Telephone Encounter (Signed)
[facility] CALLING FOR CHART NOTES SUPPORTING Tonya [name] ORDER FOR PT [place] FAXED TO [phone]

## 2016-07-20 ENCOUNTER — Ambulatory Visit (INDEPENDENT_AMBULATORY_CARE_PROVIDER_SITE_OTHER): Payer: Medicare Other | Admitting: Orthopedic Surgery

## 2016-07-20 ENCOUNTER — Encounter (INDEPENDENT_AMBULATORY_CARE_PROVIDER_SITE_OTHER): Payer: Self-pay | Admitting: Orthopedic Surgery

## 2016-07-20 VITALS — Ht 65.0 in | Wt 274.0 lb

## 2016-07-20 DIAGNOSIS — S78111S Complete traumatic amputation at level between right hip and knee, sequela: Secondary | ICD-10-CM

## 2016-07-20 DIAGNOSIS — S88011S Complete traumatic amputation at knee level, right lower leg, sequela: Secondary | ICD-10-CM
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
## 2016-08-17 DIAGNOSIS — F314 Bipolar disorder, current episode depressed, severe, without psychotic features: Secondary | ICD-10-CM | POA: Diagnosis not present

## 2016-08-17 DIAGNOSIS — F603 Borderline personality disorder: Secondary | ICD-10-CM | POA: Diagnosis not present

## 2016-08-21 NOTE — Addendum Note (Signed)
Addended by: Meridee Score on: 08/21/2016 09:11 AM   Modules accepted: Level of Service

## 2016-11-14 DIAGNOSIS — F314 Bipolar disorder, current episode depressed, severe, without psychotic features: Secondary | ICD-10-CM | POA: Diagnosis not present

## 2016-11-14 DIAGNOSIS — F603 Borderline personality disorder: Secondary | ICD-10-CM | POA: Diagnosis not present

## 2016-11-21 DIAGNOSIS — F603 Borderline personality disorder: Secondary | ICD-10-CM | POA: Diagnosis not present

## 2016-11-21 DIAGNOSIS — F314 Bipolar disorder, current episode depressed, severe, without psychotic features: Secondary | ICD-10-CM | POA: Diagnosis not present

## 2017-01-10 ENCOUNTER — Emergency Department (HOSPITAL_COMMUNITY): Payer: Medicare Other

## 2017-01-10 ENCOUNTER — Emergency Department (HOSPITAL_COMMUNITY)
Admission: EM | Admit: 2017-01-10 | Discharge: 2017-01-11 | Disposition: A | Discharge status: Home / Self Care | Payer: Medicare Other | Attending: Emergency Medicine | Admitting: Emergency Medicine

## 2017-01-10 ENCOUNTER — Encounter (HOSPITAL_COMMUNITY): Payer: Self-pay

## 2017-01-10 ENCOUNTER — Other Ambulatory Visit: Payer: Self-pay

## 2017-01-10 DIAGNOSIS — R4182 Altered mental status, unspecified: Secondary | ICD-10-CM | POA: Diagnosis present

## 2017-01-10 DIAGNOSIS — Z79899 Other long term (current) drug therapy: Secondary | ICD-10-CM | POA: Insufficient documentation

## 2017-01-10 DIAGNOSIS — F23 Brief psychotic disorder: Secondary | ICD-10-CM | POA: Insufficient documentation

## 2017-01-10 DIAGNOSIS — F3181 Bipolar II disorder: Secondary | ICD-10-CM | POA: Insufficient documentation

## 2017-01-10 DIAGNOSIS — R4020 Unspecified coma: Secondary | ICD-10-CM | POA: Diagnosis not present

## 2017-01-10 DIAGNOSIS — R9431 Abnormal electrocardiogram [ECG] [EKG]: Secondary | ICD-10-CM | POA: Diagnosis not present

## 2017-01-10 DIAGNOSIS — R45851 Suicidal ideations: Secondary | ICD-10-CM | POA: Diagnosis not present

## 2017-01-10 LAB — COMPREHENSIVE METABOLIC PANEL
ALT: 39 U/L (ref 14–54)
AST: 57 U/L — ABNORMAL HIGH (ref 15–41)
Albumin: 4 g/dL (ref 3.5–5.0)
Alkaline Phosphatase: 54 U/L (ref 38–126)
Anion gap: 13 (ref 5–15)
BUN: 13 mg/dL (ref 6–20)
CO2: 24 mmol/L (ref 22–32)
Calcium: 9.6 mg/dL (ref 8.9–10.3)
Chloride: 107 mmol/L (ref 101–111)
Creatinine, Ser: 0.95 mg/dL (ref 0.44–1.00)
GFR calc Af Amer: >60 mL/min (ref >60)
GFR calc non Af Amer: >60 mL/min (ref >60)
Glucose, Bld: 82 mg/dL (ref 65–99)
Potassium: 3.6 mmol/L (ref 3.5–5.1)
Sodium: 144 mmol/L (ref 135–145)
Total Bilirubin: 0.7 mg/dL (ref 0.3–1.2)
Total Protein: 8.5 g/dL — ABNORMAL HIGH (ref 6.5–8.1)

## 2017-01-10 LAB — CBC WITH DIFFERENTIAL/PLATELET
Basophils Absolute: 0 10*3/uL (ref 0.0–0.1)
Basophils Relative: 0 %
Eosinophils Absolute: 0 10*3/uL (ref 0.0–0.7)
Eosinophils Relative: 0 %
HCT: 39.1 % (ref 36.0–46.0)
Hemoglobin: 13.4 g/dL (ref 12.0–15.0)
Lymphocytes Relative: 20 %
Lymphs Abs: 2.1 10*3/uL (ref 0.7–4.0)
MCH: 28.9 pg (ref 26.0–34.0)
MCHC: 34.3 g/dL (ref 30.0–36.0)
MCV: 84.4 fL (ref 78.0–100.0)
Monocytes Absolute: 0.8 10*3/uL (ref 0.1–1.0)
Monocytes Relative: 7 %
Neutro Abs: 8.1 10*3/uL — ABNORMAL HIGH (ref 1.7–7.7)
Neutrophils Relative %: 73 %
Platelets: 278 10*3/uL (ref 150–400)
RBC: 4.63 MIL/uL (ref 3.87–5.11)
RDW: 13 % (ref 11.5–15.5)
WBC: 11 10*3/uL — ABNORMAL HIGH (ref 4.0–10.5)

## 2017-01-10 LAB — RAPID URINE DRUG SCREEN, HOSP PERFORMED
Amphetamines: NOT DETECTED
Barbiturates: NOT DETECTED
Benzodiazepines: POSITIVE — AB
Cocaine: NOT DETECTED
Opiates: NOT DETECTED
Tetrahydrocannabinol: NOT DETECTED

## 2017-01-10 LAB — I-STAT BETA HCG BLOOD, ED (MC, WL, AP ONLY): I-stat hCG, quantitative: <5 m[IU]/mL (ref <5)

## 2017-01-10 LAB — ACETAMINOPHEN LEVEL: Acetaminophen (Tylenol), Serum: <10 ug/mL — ABNORMAL LOW (ref 10–30)

## 2017-01-10 LAB — ETHANOL: Alcohol, Ethyl (B): <10 mg/dL (ref <10)

## 2017-01-10 LAB — SALICYLATE LEVEL: Salicylate Lvl: <7 mg/dL (ref 2.8–30.0)

## 2017-01-10 MED ORDER — HALOPERIDOL LACTATE 5 MG/ML IJ SOLN
5.0000 mg | Freq: Once | INTRAMUSCULAR | Status: AC
  Administered 2017-01-10: 5 mg via INTRAMUSCULAR
  Filled 2017-01-10: qty 1

## 2017-01-10 MED ORDER — ZIPRASIDONE MESYLATE 20 MG IM SOLR
20.0000 mg | Freq: Once | INTRAMUSCULAR | Status: AC
  Administered 2017-01-10: 20 mg via INTRAMUSCULAR
  Filled 2017-01-10: qty 20

## 2017-01-10 MED ORDER — STERILE WATER FOR INJECTION IJ SOLN
INTRAMUSCULAR | Status: AC
  Administered 2017-01-10: 1.2 mL
  Filled 2017-01-10: qty 10

## 2017-01-10 MED ORDER — LORAZEPAM 2 MG/ML IJ SOLN
1.0000 mg | Freq: Once | INTRAMUSCULAR | Status: AC
  Administered 2017-01-10: 1 mg via INTRAMUSCULAR

## 2017-01-10 MED ORDER — KETAMINE HCL 10 MG/ML IJ SOLN: 1.0000 mg/kg | Freq: Once | INTRAMUSCULAR | Status: DC

## 2017-01-10 MED ORDER — KETAMINE HCL 10 MG/ML IJ SOLN
INTRAMUSCULAR | Status: AC | PRN
  Administered 2017-01-10: 100 mg via INTRAVENOUS

## 2017-01-10 MED ORDER — LORAZEPAM 2 MG/ML IJ SOLN
2.0000 mg | INTRAMUSCULAR | Status: DC | PRN
  Administered 2017-01-10: 2 mg via INTRAVENOUS
  Filled 2017-01-10: qty 1

## 2017-01-10 MED ORDER — KETAMINE HCL 50 MG/ML IJ SOLN
400.0000 mg | Freq: Once | INTRAMUSCULAR | Status: DC
  Filled 2017-01-10: qty 10

## 2017-01-10 NOTE — ED Provider Notes (Signed)
Care assumed from previous provider PA [facility]. Please see note for further details. Case discussed, plan agreed upon. Briefly, patient is a 31 y.o. female with hx of bipolar disorder, TBI who presented to ED by [facility] for altered mental status. Per previous provider, CT head pending, if negative medically cleared and disposition per TTS. Unfortunately, patient has not been cooperative enough to get CT done today. Will try to sedate with ativan / haldol and obtain CT head.   CT negative.   Medically cleared with disposition per psychiatry recommendations.     Sireen Halk, Ozella Almond, PA-C 01/10/17 2211    Tanna Furry, MD 01/17/17 1037
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
## 2017-01-10 NOTE — ED Triage Notes (Signed)
Pt arrived via [facility], reports found sitting in parking lot of her apartment complex, talking out of her head and sitting on a pile of ice. Would not answer questions. Began shaking, no seizure activity.

## 2017-01-10 NOTE — ED Notes (Signed)
Pt repeatedly asking police to shoot her in the head stating she is "ready to die"

## 2017-01-10 NOTE — ED Provider Notes (Addendum)
Patient presented to the ER with agitation.  Patient found sitting in the parking lot outside her residence, appeared agitated and disheveled, not answering questions appropriately.  EMS report that she became very combative and was administered IM Versed for sedation prior to arrival.  At arrival she will not answer questions, says over and over again "I should not be here, I want to die, please shoot me".  Face to face Exam: HEENT - PERRLA Lungs - CTAB Heart - RRR, no M/R/G Abd - S/NT/ND Neuro - moving all extremities (R AKA) Psych - agitated and combatitive  Plan: Patient extremely agitated at arrival.  She had received Versed by EMS, will also administer Geodon here for sedation.  Will perform medical clearance including head CT, as she has a history of traumatic brain injury, but current presentation is consistent with acute psychosis.  Will require psych eval.   Orpah Greek, MD 01/10/17 0630    Orpah Greek, MD 01/10/17 [phone]

## 2017-01-10 NOTE — BH Assessment (Signed)
Pt not able to be assessed at this time due to sedation. Will attempt to assess later today.  Bedelia Person, [place], [facility] Lead Triage Specialist  [facility]  Therapeutic Triage Services Phone: [phone] Fax: [phone]

## 2017-01-10 NOTE — ED Provider Notes (Signed)
Patient's care assumed at shift changed. Patient still with agitation that precludes CT imaging. Visual provider felt this was indicated because history of traumatic brain injury. Patient was given additional IM meds with minimal effect. Ultimately underwent conscious sedation by myself. Conscious sedation with ketamine 100 mg IV. CT scan was done without difficulty. I compared the patient through her sedation, to CT scan, and Dr. room. Total bedside time by myself of 30 minutes. application. CT shows no acute findings. Await TTS evaluation. Medically clear at this time.       Tanna Furry, MD 01/10/17 [phone]

## 2017-01-10 NOTE — ED Notes (Signed)
Bed: SR15 Expected date:  Expected time:  Means of arrival:  Comments: 31 yo F  Combative, police with EMS, Versed given

## 2017-01-11 MED ORDER — CARBAMAZEPINE ER 200 MG PO TB12
200.0000 mg | ORAL_TABLET | Freq: Two times a day (BID) | ORAL | Status: DC
  Administered 2017-01-11: 200 mg via ORAL
  Filled 2017-01-11: qty 1

## 2017-01-11 MED ORDER — BREXPIPRAZOLE 2 MG PO TABS: 1.0000 | ORAL_TABLET | Freq: Every day | ORAL | Status: DC

## 2017-01-11 MED ORDER — BREXPIPRAZOLE 1 MG PO TABS
2.0000 mg | ORAL_TABLET | Freq: Every day | ORAL | Status: DC
  Administered 2017-01-11: 2 mg via ORAL
  Filled 2017-01-11: qty 2

## 2017-01-11 MED ORDER — CARBAMAZEPINE ER 200 MG PO TB12: 200.0000 mg | ORAL_TABLET | Freq: Two times a day (BID) | ORAL | 0 refills | Status: DC

## 2017-01-11 NOTE — Clinical Social Work Note (Signed)
Clinical Social Work Assessment  Patient Details  Name: Tonya [name] MRN: [id number] Date of Birth: 03/02/1985  Date of referral:  01/11/17               Reason for consult:  Discharge Planning                Permission sought to share information with:    Permission granted to share information::     Name::        Agency::     Relationship::     Contact Information:     Housing/Transportation Living arrangements for the past 2 months:  Apartment Source of Information:  Patient Patient Interpreter Needed:  None Criminal Activity/Legal Involvement Pertinent to Current Situation/Hospitalization:    Significant Relationships:  Parents Lives with:  Self Do you feel safe going back to the place where you live?  Yes Need for family participation in patient care:     Care giving concerns:  CSW consulted for assistance with transportation.   Social Worker assessment / plan:  CSW attempted to contact patients mother, Scot Jun [phone], per patients request for assistance with transportation- no answer, voicemail left for return call. Patient states she currently lives alone and uses Uber/ Lyft for transportation however would prefer her mother to pick her up today. CSW will continue to attempt Ms. Placido Sou.    Other numbers on chart: [phone] and [phone] are no longer in service.   Employment status:  Disabled (Comment on whether or not currently receiving Disability) Insurance information:  Medicare PT Recommendations:  Not assessed at this time Information / Referral to community resources:     Patient/Family's Response to care:  Patient appreciated CSW.   Patient/Family's Understanding of and Emotional Response to Diagnosis, Current Treatment, and Prognosis:  Understands current treatment and prognosis.  Emotional Assessment Appearance:  Appears stated age Attitude/Demeanor/Rapport:    Affect (typically observed):  Accepting, Pleasant, Calm Orientation:   Oriented to Self, Oriented to Place, Oriented to  Time, Oriented to Situation Alcohol / Substance use:    Psych involvement (Current and /or in the community):  Yes (Comment)  Discharge Needs  Concerns to be addressed:  No discharge needs identified Readmission within the last 30 days:  No Current discharge risk:  None Barriers to Discharge:  No Barriers Identified   Weston Anna, LCSW 01/11/2017, 1:18 PM

## 2017-01-11 NOTE — ED Notes (Signed)
Pt stated "I have been calling a crisis # and just talking to someone.  I was trying to get my meds changed.  I just had a mental break.  I'm going to [employer] and want to become a Pharmacist, hospital.  I have a friend who lives in [place] and I have bought tickets to go there there 16th through the 20th.  I have a hotel room @ the [facility] downtown.  I want to move to [facility].  Do you think I'll be able to go there?  I have a lot of stress.  My family lives in [place] but for me to go back there is taking steps back."
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
## 2017-01-11 NOTE — Progress Notes (Signed)
CSW spoke with patient via bedside regarding transportation concerns- patient currently does not have any belongings with her at hospital (cellphone, wallet, keys to apartment) and is unsure if apartment is unlocked. Due to the uncertainty of apartment being unlocked and not being able to contact patients friends/ family patient will need to transport via taxi voucher. CSW has requested DME walker to be ordered for patient assistance at home and with transportation. Once walker is received RN can call taxi.   Kingsley Spittle, [facility] Clinical Social Worker [phone]

## 2017-01-11 NOTE — ED Notes (Signed)
Pt woken up momentarily for CPAP fitting. CPAP now on and pt resting comfortable on stomach.
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
## 2017-01-11 NOTE — ED Notes (Signed)
RT called for CPAP

## 2017-01-11 NOTE — ED Notes (Signed)
Pt hoodie removed and placed in paper scrub top

## 2017-01-11 NOTE — Discharge Instructions (Signed)
For your behavioral health needs, you are advised to follow up with the [facility]:       [facility]      [place], [place] [address]      [phone]

## 2017-01-11 NOTE — Care Management Note (Signed)
Case Management Note  Patient Details  Name: Tonya [name] MRN: [id number] Date of Birth: 05-30-1985  Subjective/Objective:                  32 y.o. female with history of bipolar 1 disorder, suicide attempts, OSA, TBI, and right BKA presents brought in by EMS and [facility] with altered mental status.   Action/Plan: CM consulted for DME walker to assist with transition home.  Contacted Santiago Glad with [facility] who will take the pt a walker at this time.  No further needs noted.  Expected Discharge Date:  01/11/17               Expected Discharge Plan:  Home/Self Care  In-House Referral:  Clinical Social Work  Discharge planning Services  CM Consult  Post Acute Care Choice:  Durable Medical Equipment  DME Arranged:  [place]ford Rile rolling DME Agency:  [place].  Status of Service:  Completed, signed off  Rae Mar, RN 01/11/2017, 3:39 PM
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
## 2017-01-11 NOTE — ED Notes (Signed)
Call from TTS ready to evaluate at this time

## 2017-01-11 NOTE — ED Notes (Signed)
Discharge instructions explained. [employer] called, wheelchair discharged.

## 2017-01-11 NOTE — BH Assessment (Addendum)
[facility] Assessment Progress Note  Per Buford Dresser, DO, this pt does not require psychiatric hospitalization at this time.  Pt presents under IVC initiated by EDP Malachy Moan, MD, which Dr Mariea Clonts has rescinded.  Pt is to be discharged from [facility] with recommendation to follow up with the [place].  This has been included in pt's discharge instructions.  Pt's nurse has been notified.  Jalene Mullet, [place] Triage Specialist [phone]

## 2017-01-11 NOTE — ED Notes (Signed)
Walker at bedside.  

## 2017-01-20 DIAGNOSIS — H40033 Anatomical narrow angle, bilateral: Secondary | ICD-10-CM | POA: Diagnosis not present

## 2017-01-20 DIAGNOSIS — H04123 Dry eye syndrome of bilateral lacrimal glands: Secondary | ICD-10-CM | POA: Diagnosis not present

## 2017-02-13 DIAGNOSIS — F314 Bipolar disorder, current episode depressed, severe, without psychotic features: Secondary | ICD-10-CM | POA: Diagnosis not present

## 2017-02-13 DIAGNOSIS — F603 Borderline personality disorder: Secondary | ICD-10-CM | POA: Diagnosis not present

## 2017-02-28 ENCOUNTER — Ambulatory Visit: Payer: Self-pay | Admitting: Pulmonary Disease

## 2017-02-28 ENCOUNTER — Encounter: Payer: Self-pay | Admitting: Adult Health

## 2017-02-28 ENCOUNTER — Ambulatory Visit (INDEPENDENT_AMBULATORY_CARE_PROVIDER_SITE_OTHER): Payer: Medicare HMO | Admitting: Adult Health

## 2017-02-28 DIAGNOSIS — G4733 Obstructive sleep apnea (adult) (pediatric): Secondary | ICD-10-CM

## 2017-02-28 NOTE — Patient Instructions (Signed)
Keep up good work with CPAP  Wear each night .  Work on healthy weight .  Order new mask -size medium  Download requested.  Follow up with Dr. Halford Chessman  In 1 year and As needed

## 2017-02-28 NOTE — Assessment & Plan Note (Signed)
Doing well on CPAP  Will adjust CPAP to help with comfort  8 to 15cmH2O .   Plan  Patient Instructions  Keep up good work with CPAP  Wear each night .  Work on healthy weight .  Order new mask -size medium  Download requested.  Follow up with Dr. Halford Chessman  In 1 year and As needed

## 2017-02-28 NOTE — Assessment & Plan Note (Signed)
Wt loss  

## 2017-02-28 NOTE — Addendum Note (Signed)
Addended by: Amado Coe on: 02/28/2017 09:33 AM   Modules accepted: Orders

## 2017-02-28 NOTE — Progress Notes (Signed)
@Patient  ID: Tonya [name], female    DOB: Apr 19, 1985, 32 y.o.   MRN: [id number]  Chief Complaint  Patient presents with  . Follow-up    OSA     Referring provider: No ref. provider found  HPI: 32 yo female followed for OSA   TEST  PSG 08/14/14 >>AHI 30.1, SaO2 low 73%. CPAP 11 cm H2O. Auto CPAP 08/27/14 to 10/26/14 >>used on 26 of 61 nights with average 3 hrs and 27 min. Average AHI is 2 with median CPAP 9 cm H2O and 95 th percentile CPAP 12 cm H20.'  02/28/2017 Follow up : OSA  Pt returns for follow up for sleep apnea.  Patient remains on CPAP at bedtime.  She is recently got a new CPAP machine.  She says she feels rested.  Is having some issues with the size of her mass.  We have contacted her DME company to resolve this issue.  Patient says she feels rested with no significant daytime sleepiness.  Says sometimes it feels the pressure is not enough at times.  Download requested.    No Known Allergies  Immunization History  Administered Date(s) Administered  . Influenza Split 10/26/2011  . Influenza,inj,Quad PF,6+ Mos 10/30/2013  . Pneumococcal Polysaccharide-23 10/26/2011    Past Medical History:  Diagnosis Date  . Bipolar 1 disorder ([place])   . Depression   . OSA (obstructive sleep apnea) 08/20/2014  . Suicide attempt ([place])   . Transfusion of blood during current hospitalization    SUMMER 2013  . UTI (lower urinary tract infection) 10/20/11   being treated, has 4 doses abx left    Tobacco History: Social History   Tobacco Use  Smoking Status Never Smoker  Smokeless Tobacco Never Used   Counseling given: Not Answered   Outpatient Encounter Medications as of 02/28/2017  Medication Sig  . acetaminophen (TYLENOL) 500 MG tablet Take 1,000 mg by mouth every 6 (six) hours as needed for mild pain.  Marland Kitchen albuterol (PROVENTIL HFA;VENTOLIN HFA) 108 (90 Base) MCG/ACT inhaler Inhale 2 puffs into the lungs every 6 (six) hours as needed for wheezing.  . Brexpiprazole  (REXULTI) 2 MG TABS Take 1 tablet by mouth daily.  . carbamazepine (TEGRETOL XR) 200 MG 12 hr tablet Take 1 tablet (200 mg total) by mouth 2 (two) times daily.  Marland Kitchen desogestrel-ethinyl estradiol (AZURETTE) 0.15-0.02/0.01 MG (21/5) tablet Take 1 tablet by mouth daily.   No facility-administered encounter medications on file as of 02/28/2017.      Review of Systems  Constitutional:   No  weight loss, night sweats,  Fevers, chills, fatigue, or  lassitude.  HEENT:   No headaches,  Difficulty swallowing,  Tooth/dental problems, or  Sore throat,                No sneezing, itching, ear ache, nasal congestion, post nasal drip,   CV:  No chest pain,  Orthopnea, PND, swelling in lower extremities, anasarca, dizziness, palpitations, syncope.   GI  No heartburn, indigestion, abdominal pain, nausea, vomiting, diarrhea, change in bowel habits, loss of appetite, bloody stools.   Resp: No shortness of breath with exertion or at rest.  No excess mucus, no productive cough,  No non-productive cough,  No coughing up of blood.  No change in color of mucus.  No wheezing.  No chest wall deformity  Skin: no rash or lesions.  GU: no dysuria, change in color of urine, no urgency or frequency.  No flank pain, no hematuria  MS:  No joint pain or swelling.  No decreased range of motion.  No back pain.    Physical Exam  BP 124/72 (BP Location: Left Arm, Cuff Size: Large)   Pulse 81   Ht 5\' 6"  (1.676 m)   Wt 282 lb (127.9 kg)   SpO2 95%   BMI 45.52 kg/m   GEN: A/Ox3; pleasant , NAD, obese    HEENT:  [place]/AT,  EACs-clear, TMs-wnl, NOSE-clear, THROAT-clear, no lesions, no postnasal drip or exudate noted. Class 3 MP airway   NECK:  Supple w/ fair ROM; no JVD; normal carotid impulses w/o bruits; no thyromegaly or nodules palpated; no lymphadenopathy.    RESP  Clear  P & A; w/o, wheezes/ rales/ or rhonchi. no accessory muscle use, no dullness to percussion  CARD:  RRR, no m/r/g, no peripheral edema, pulses  intact, no cyanosis or clubbing.  GI:   Soft & nt; nml bowel sounds; no organomegaly or masses detected.   Musco: Warm bil, no deformities or joint swelling noted.   Neuro: alert, no focal deficits noted.    Skin: Warm, no lesions or rashes    Lab Results:   BMET  BNP No results found for: BNP  ProBNP No results found for: PROBNP  Imaging: No results found.   Assessment & Plan:   OSA (obstructive sleep apnea) Doing well on CPAP  Will adjust CPAP to help with comfort  8 to 15cmH2O .   Plan  Patient Instructions  Keep up good work with CPAP  Wear each night .  Work on healthy weight .  Order new mask -size medium  Download requested.  Follow up with Dr. Halford Chessman  In 1 year and As needed      Morbid obesity [facility]) Wt loss      Rexene Edison, NP 02/28/2017

## 2017-03-06 NOTE — Progress Notes (Signed)
Reviewed and agree with assessment/plan.   Brenda Samano, MD [place] [facility] 01/26/2016, 12:24 PM Pager:  [phone]  

## 2017-03-09 ENCOUNTER — Telehealth: Payer: Self-pay | Admitting: Adult Health

## 2017-03-09 NOTE — Telephone Encounter (Signed)
Spoke with patient. She was seen by TP on 1/30 and requested a new mask and pressure settings increase. She was calling to check on status of request. Advised her that the order had been sent to [facility]. Patient verbalized understanding. Nothing else needed at time of call.

## 2017-03-16 DIAGNOSIS — G4733 Obstructive sleep apnea (adult) (pediatric): Secondary | ICD-10-CM | POA: Diagnosis not present

## 2017-03-27 DIAGNOSIS — F314 Bipolar disorder, current episode depressed, severe, without psychotic features: Secondary | ICD-10-CM | POA: Diagnosis not present

## 2017-03-27 DIAGNOSIS — F603 Borderline personality disorder: Secondary | ICD-10-CM | POA: Diagnosis not present

## 2017-04-03 ENCOUNTER — Encounter (HOSPITAL_COMMUNITY): Payer: Self-pay | Admitting: Emergency Medicine

## 2017-04-03 ENCOUNTER — Ambulatory Visit (HOSPITAL_COMMUNITY)
Admission: EM | Admit: 2017-04-03 | Discharge: 2017-04-03 | Disposition: A | Discharge status: Home / Self Care | Payer: Medicare HMO | Attending: Family Medicine | Admitting: Family Medicine

## 2017-04-03 DIAGNOSIS — R69 Illness, unspecified: Secondary | ICD-10-CM | POA: Diagnosis not present

## 2017-04-03 DIAGNOSIS — J111 Influenza due to unidentified influenza virus with other respiratory manifestations: Secondary | ICD-10-CM

## 2017-04-03 MED ORDER — HYDROCODONE-HOMATROPINE 5-1.5 MG/5ML PO SYRP: 5.0000 mL | ORAL_SOLUTION | Freq: Four times a day (QID) | ORAL | 0 refills | Status: DC | PRN

## 2017-04-03 MED ORDER — IPRATROPIUM BROMIDE 0.03 % NA SOLN: 2.0000 | Freq: Two times a day (BID) | NASAL | 0 refills | Status: AC

## 2017-04-03 NOTE — ED Triage Notes (Signed)
Pt here with cough, congestion and body aches
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
## 2017-05-01 ENCOUNTER — Encounter: Payer: Self-pay | Admitting: Family Medicine

## 2017-05-01 ENCOUNTER — Ambulatory Visit: Payer: Self-pay | Admitting: Nurse Practitioner

## 2017-05-01 ENCOUNTER — Ambulatory Visit (INDEPENDENT_AMBULATORY_CARE_PROVIDER_SITE_OTHER): Payer: Medicare HMO | Admitting: Family Medicine

## 2017-05-01 VITALS — BP 120/60 | HR 95 | Temp 98.2°F | Ht 66.0 in | Wt 311.6 lb

## 2017-05-01 DIAGNOSIS — J4520 Mild intermittent asthma, uncomplicated: Secondary | ICD-10-CM | POA: Diagnosis not present

## 2017-05-01 DIAGNOSIS — M546 Pain in thoracic spine: Secondary | ICD-10-CM

## 2017-05-01 DIAGNOSIS — Z89611 Acquired absence of right leg above knee: Secondary | ICD-10-CM

## 2017-05-01 DIAGNOSIS — Z7689 Persons encountering health services in other specified circumstances: Secondary | ICD-10-CM

## 2017-05-01 DIAGNOSIS — Z91013 Allergy to seafood: Secondary | ICD-10-CM | POA: Diagnosis not present

## 2017-05-01 DIAGNOSIS — G8929 Other chronic pain: Secondary | ICD-10-CM | POA: Diagnosis not present

## 2017-05-01 MED ORDER — ALBUTEROL SULFATE HFA 108 (90 BASE) MCG/ACT IN AERS: 2.0000 | INHALATION_SPRAY | Freq: Four times a day (QID) | RESPIRATORY_TRACT | 3 refills | Status: DC | PRN

## 2017-05-01 MED ORDER — EPINEPHRINE 0.3 MG/0.3ML IJ SOAJ: 0.3000 mg | Freq: Once | INTRAMUSCULAR | 1 refills | Status: AC

## 2017-05-01 NOTE — Patient Instructions (Signed)
Food Allergy A food allergy is an abnormal reaction to a food (food allergen) by the body's defense system (immune system). Foods that commonly cause allergies are:  Milk.  Seafood.  Eggs.  Nuts.  Wheat.  Soy.  What are the causes? Food allergies happen when the immune system mistakenly sees a food as harmful and releases antibodies to fight it. What are the signs or symptoms? Symptoms may be mild or severe. They usually start minutes after the food is eaten, but they can occur even a few hours later. In people with a severe allergy, symptoms can start within seconds. Mild symptoms  Nasal congestion.  Tingling in the mouth.  An itchy, red rash.  Vomiting.  Diarrhea. Severe symptoms  Swelling of the lips, face, and tongue.  Swelling of the back of the mouth and throat.  Wheezing.  A hoarse voice.  Itchy, red, swollen areas of skin (hives).  Dizziness or light-headedness.  Fainting.  Trouble breathing, speaking, or swallowing.  Chest tightness.  Rapid heartbeat. How is this diagnosed? A diagnosis is made with a physical exam, medical and family history, and one or more of the following:  Skin tests.  Blood tests.  A food diary.  The results of an elimination diet. The elimination diet involves removing foods from your diet and then adding them back in, one at a time.  How is this treated? There is no cure for allergies. An allergic reaction can be treated with medicines, such as:  Antihistamines.  Steroids.  Respiratory inhalers.  Epinephrine.  Severe symptoms can be a sign of a life-threatening reaction called anaphylaxis, and they require immediate treatment. Severe reactions usually need to be treated at a hospital. People who have had a severe reaction may be prescribed rescue medicines to take if they are accidentally exposed to an allergen. Follow these instructions at home: General instructions  Avoid the foods that you are allergic  to.  Read food labels before you eat packaged items. Look for ingredients you are allergic to.  When you are at a restaurant, tell your server that you have an allergy. If you are unsure of whether a meal has an ingredient that you are allergic to, ask your server.  Take medicines only as directed by your health care provider. Do not drive until the medicine has worn off, unless your health care provider gives you approval.  Inform all health care providers that you have a food allergy.  Contact your health care provider if you want to be tested for an allergy. If you have had an anaphylactic reaction before, you should never test yourself for an allergy without your health care provider's approval. Instructions for People with Severe Allergies  Wear a medical alert bracelet or necklace that describes your allergy.  Carry your anaphylaxis kit or an epinephrine injection with you at all times. Use them as directed by your health care provider.  Make sure that you, your family members, and your employer know: ? How to use an anaphylaxis kit. ? How to give an epinephrine injection.  Replace your epinephrine immediately after use, in case you have another reaction.  Seek medical care even after you take epinephrine. This is important because epinephrine can be followed by a delayed, life-threatening reaction. Instructions for People with a Potential Allergy  Follow the elimination diet as directed by your health care provider.  Keep a food diary as directed by your health care provider. Every day, write down: ? What you eat and   drink and when. ? What symptoms you have and when. Contact a health care provider if:  Your symptoms have not gone away within 2 days.  Your symptoms get worse.  You develop new symptoms. Get help right away if:  You use epinephrine.  You are having a severe allergic reaction. Symptoms of a severe reaction include: ? Swelling of the lips, face, and  tongue. ? Swelling of the back of the mouth and throat. ? Wheezing. ? A hoarse voice. ? Hives. ? Dizziness or light-headedness. ? Fainting. ? Trouble breathing, speaking, or swallowing. ? Chest tightness. ? Rapid heartbeat. This information is not intended to replace advice given to you by your health care provider. Make sure you discuss any questions you have with your health care provider. Document Released: 01/14/2000 Document Revised: 06/15/2015 Document Reviewed: 10/28/2013 Elsevier Interactive Patient Education  2018 [employer].  Breast Reduction Breast reduction, also called reduction mammoplasty, is surgery to reduce the size of the breasts by removing fat, tissue, and excess skin. The goal of this procedure is to help relieve problems that are caused or made worse by large breasts, such as:  Poor posture.  Long-term (chronic) back and neck pain.  Difficulty exercising.  A rash on the skin under the breasts.  Breast pain.  Grooves in the shoulder from bra straps.  Difficulty with hygiene.  Psychological distress caused by large breasts.  You may get a breast reduction to enhance the appearance of the breasts or for a medical need. Tell a health care provider about:  Any allergies you have.  All medicines you are taking, including vitamins, herbs, eye drops, creams, and over-the-counter medicines.  Any problems you or family members have had with anesthetic medicines.  Any blood disorders you have.  Any surgeries you have had.  Any medical conditions you have.  Whether you are pregnant or may be pregnant. What are the risks? Generally, this is a safe procedure. However, problems may occur, including:  Infection.  Bleeding. Blood may pool near the incision area (hematoma), or blood clots may form.  Allergic reactions to medicines.  Damage to other structures or organs, such as the dark area around the nipple (areola).  Partial or total numbness in  the nipple or breast. Sometimes feeling returns, but not always.  Inability to breastfeed.  Pneumonia.  What happens before the procedure? Medicines  Ask your health care provider about: ? Changing or stopping your regular medicines. This is especially important if you are taking diabetes medicines or blood thinners. ? Taking medicines such as aspirin and ibuprofen. These medicines can thin your blood. Do not take these medicines before your procedure if your health care provider instructs you not to.  You may be given antibiotic medicine to help prevent infection. Staying hydrated Follow instructions from your health care provider about hydration, which may include:  Up to 2 hours before the procedure - you may continue to drink clear liquids, such as water, clear fruit juice, black coffee, and plain tea.  Eating and drinking restrictions Follow instructions from your health care provider about eating and drinking, which may include:  8 hours before the procedure - stop eating heavy meals or foods such as meat, fried foods, or fatty foods.  6 hours before the procedure - stop eating light meals or foods, such as toast or cereal.  6 hours before the procedure - stop drinking milk or drinks that contain milk.  2 hours before the procedure - stop drinking clear  liquids.  General instructions  Stop taking vitamin E supplements 2 weeks before your procedure. Talk with your health care provider about stopping other supplements, herbs, and teas that you regularly take.  Ask your health care provider how your surgical site will be marked or identified.  You may have blood tests.  You may have an X-ray exam of the breasts (mammogram).  You may be asked to bathe using a germ-killing (antiseptic) soap before your procedure.  For at least 2 weeks before your procedure, do not use any products that contain nicotine or tobacco, such as cigarettes and e-cigarettes. These products can  delay healing after surgery. If you need help quitting, ask your health care provider.  Plan to have someone take you home from the hospital or clinic.  If you will be going home right after the procedure, plan to have someone with you for 24 hours. What happens during the procedure?  To lower your risk of infection: ? Your health care team will wash or sanitize their hands. ? Your skin will be washed with soap.  An IV tube will be inserted into one of your veins.  You will be given a medicine to make you fall asleep (general anesthetic). You may also be given a medicine to help you relax (sedative).  An incision will be made in each breast.  Fat, tissue, and excess skin will be removed from each breast.  Your breasts will be reshaped. Your nipples may be moved so they are centered on your smaller breasts.  Tubes will be placed in your breast tissue to drain fluid from your surgical area. The tubes will be removed 2-3 days after the surgery.  Your incisions will be closed with stitches (sutures) and covered with surgical tape. Your breasts will be covered with gauze and elastic bandages (dressings). The procedure may vary among health care providers and hospitals. What happens after the procedure?  Your blood pressure, heart rate, breathing rate, and blood oxygen level will be monitored until the medicines you were given have worn off.  You may continue to receive fluids and medicines through an IV tube.  You may have to wear compression stockings. These stockings help to prevent blood clots and reduce swelling in your legs.  You will continue to have tubes draining fluid from your surgical area.  You may be given a special bra to wear.  You may have breast pain. You will be given medicine to help relieve pain.  Do not drive for 24 hours if you were given a sedative. Summary  Breast reduction, also called reduction mammoplasty, is surgery to reduce the size of the breasts by  removing fat, tissue, and excess skin.  Tubes will be placed in your breast tissue to drain fluid from your surgical area. The tubes will be removed 2-3 days after the surgery.  Your incisions will be closed with stitches (sutures) and covered with surgical tape. Your breasts will be covered with gauze and elastic bandages (dressings).  You may have breast pain. You will be given medicine to help relieve pain.  Plan to have someone take you home from the hospital or clinic. This information is not intended to replace advice given to you by your health care provider. Make sure you discuss any questions you have with your health care provider. Document Released: 04/14/2008 Document Revised: 09/28/2015 Document Reviewed: 09/28/2015 Elsevier Interactive Patient Education  2017 [employer].
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
## 2017-05-22 DIAGNOSIS — F603 Borderline personality disorder: Secondary | ICD-10-CM | POA: Diagnosis not present

## 2017-05-22 DIAGNOSIS — F314 Bipolar disorder, current episode depressed, severe, without psychotic features: Secondary | ICD-10-CM | POA: Diagnosis not present

## 2017-05-25 ENCOUNTER — Telehealth: Payer: Self-pay | Admitting: Family Medicine

## 2017-05-25 NOTE — Telephone Encounter (Signed)
Copied from [place]. Topic: Referral - Question >> May 24, 2017  4:08 PM Percell Belt A wrote: Reason for CRM: pt called in and stated that he has an appt on Tuesday the 30th at [facility]: Cristine Polio, MD but they just told her today that they do not have a referral.  They need a referral sent before she can see her >> May 24, 2017  4:22 PM Jinnie Onley, Emilio Math, RN wrote: Referral was made, I left a voice message for pt with the phone number to follow up.  >> May 25, 2017 10:18 AM Ether Griffins B wrote: Maudie Mercury with Dr. Towanda Malkin office calling stating they do not have the referral yet for the pt. Pt does have an appt on 05/29/17. Please re fax to [phone].

## 2017-06-04 DIAGNOSIS — F314 Bipolar disorder, current episode depressed, severe, without psychotic features: Secondary | ICD-10-CM | POA: Diagnosis not present

## 2017-06-04 DIAGNOSIS — F603 Borderline personality disorder: Secondary | ICD-10-CM | POA: Diagnosis not present

## 2017-06-29 DIAGNOSIS — G4733 Obstructive sleep apnea (adult) (pediatric): Secondary | ICD-10-CM | POA: Diagnosis not present

## 2017-07-02 DIAGNOSIS — G4700 Insomnia, unspecified: Secondary | ICD-10-CM | POA: Diagnosis not present

## 2017-07-02 DIAGNOSIS — F4312 Post-traumatic stress disorder, chronic: Secondary | ICD-10-CM | POA: Diagnosis not present

## 2017-07-02 DIAGNOSIS — F411 Generalized anxiety disorder: Secondary | ICD-10-CM | POA: Diagnosis not present

## 2017-07-02 DIAGNOSIS — F3181 Bipolar II disorder: Secondary | ICD-10-CM | POA: Diagnosis not present

## 2017-07-02 DIAGNOSIS — F314 Bipolar disorder, current episode depressed, severe, without psychotic features: Secondary | ICD-10-CM | POA: Diagnosis not present

## 2017-07-02 DIAGNOSIS — F603 Borderline personality disorder: Secondary | ICD-10-CM | POA: Diagnosis not present

## 2017-07-20 DIAGNOSIS — E039 Hypothyroidism, unspecified: Secondary | ICD-10-CM | POA: Diagnosis not present

## 2017-07-20 DIAGNOSIS — F3181 Bipolar II disorder: Secondary | ICD-10-CM | POA: Diagnosis not present

## 2017-07-20 DIAGNOSIS — R739 Hyperglycemia, unspecified: Secondary | ICD-10-CM | POA: Diagnosis not present

## 2017-07-20 DIAGNOSIS — E569 Vitamin deficiency, unspecified: Secondary | ICD-10-CM | POA: Diagnosis not present

## 2017-07-23 DIAGNOSIS — F411 Generalized anxiety disorder: Secondary | ICD-10-CM | POA: Diagnosis not present

## 2017-07-23 DIAGNOSIS — F4312 Post-traumatic stress disorder, chronic: Secondary | ICD-10-CM | POA: Diagnosis not present

## 2017-07-23 DIAGNOSIS — F603 Borderline personality disorder: Secondary | ICD-10-CM | POA: Diagnosis not present

## 2017-07-23 DIAGNOSIS — F3181 Bipolar II disorder: Secondary | ICD-10-CM | POA: Diagnosis not present

## 2017-07-24 DIAGNOSIS — F603 Borderline personality disorder: Secondary | ICD-10-CM | POA: Diagnosis not present

## 2017-07-24 DIAGNOSIS — F314 Bipolar disorder, current episode depressed, severe, without psychotic features: Secondary | ICD-10-CM | POA: Diagnosis not present

## 2017-07-27 ENCOUNTER — Encounter (INDEPENDENT_AMBULATORY_CARE_PROVIDER_SITE_OTHER): Payer: Self-pay | Admitting: Orthopedic Surgery

## 2017-07-27 ENCOUNTER — Ambulatory Visit (INDEPENDENT_AMBULATORY_CARE_PROVIDER_SITE_OTHER): Payer: Medicare HMO | Admitting: Orthopedic Surgery

## 2017-07-27 VITALS — Ht 66.0 in | Wt 311.0 lb

## 2017-07-27 DIAGNOSIS — Z89611 Acquired absence of right leg above knee: Secondary | ICD-10-CM | POA: Diagnosis not present
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
## 2017-07-30 DIAGNOSIS — F4312 Post-traumatic stress disorder, chronic: Secondary | ICD-10-CM | POA: Diagnosis not present

## 2017-07-30 DIAGNOSIS — F3181 Bipolar II disorder: Secondary | ICD-10-CM | POA: Diagnosis not present

## 2017-07-30 DIAGNOSIS — F411 Generalized anxiety disorder: Secondary | ICD-10-CM | POA: Diagnosis not present

## 2017-07-30 DIAGNOSIS — F603 Borderline personality disorder: Secondary | ICD-10-CM | POA: Diagnosis not present

## 2017-08-15 DIAGNOSIS — Z01419 Encounter for gynecological examination (general) (routine) without abnormal findings: Secondary | ICD-10-CM | POA: Diagnosis not present

## 2017-08-15 DIAGNOSIS — Z124 Encounter for screening for malignant neoplasm of cervix: Secondary | ICD-10-CM | POA: Diagnosis not present

## 2017-09-06 DIAGNOSIS — Z89611 Acquired absence of right leg above knee: Secondary | ICD-10-CM | POA: Diagnosis not present

## 2017-09-12 DIAGNOSIS — F3181 Bipolar II disorder: Secondary | ICD-10-CM | POA: Diagnosis not present

## 2017-09-12 DIAGNOSIS — F603 Borderline personality disorder: Secondary | ICD-10-CM | POA: Diagnosis not present

## 2017-09-12 DIAGNOSIS — F411 Generalized anxiety disorder: Secondary | ICD-10-CM | POA: Diagnosis not present

## 2017-09-12 DIAGNOSIS — F4312 Post-traumatic stress disorder, chronic: Secondary | ICD-10-CM | POA: Diagnosis not present

## 2017-09-28 DIAGNOSIS — G4733 Obstructive sleep apnea (adult) (pediatric): Secondary | ICD-10-CM | POA: Diagnosis not present

## 2017-10-09 DIAGNOSIS — F4312 Post-traumatic stress disorder, chronic: Secondary | ICD-10-CM | POA: Diagnosis not present

## 2017-10-09 DIAGNOSIS — F411 Generalized anxiety disorder: Secondary | ICD-10-CM | POA: Diagnosis not present

## 2017-10-09 DIAGNOSIS — F314 Bipolar disorder, current episode depressed, severe, without psychotic features: Secondary | ICD-10-CM | POA: Diagnosis not present

## 2017-10-09 DIAGNOSIS — F3181 Bipolar II disorder: Secondary | ICD-10-CM | POA: Diagnosis not present

## 2017-10-09 DIAGNOSIS — F603 Borderline personality disorder: Secondary | ICD-10-CM | POA: Diagnosis not present

## 2017-11-13 DIAGNOSIS — F4312 Post-traumatic stress disorder, chronic: Secondary | ICD-10-CM | POA: Diagnosis not present

## 2017-11-13 DIAGNOSIS — F411 Generalized anxiety disorder: Secondary | ICD-10-CM | POA: Diagnosis not present

## 2017-11-13 DIAGNOSIS — F3181 Bipolar II disorder: Secondary | ICD-10-CM | POA: Diagnosis not present

## 2017-11-13 DIAGNOSIS — F603 Borderline personality disorder: Secondary | ICD-10-CM | POA: Diagnosis not present

## 2017-11-27 DIAGNOSIS — F603 Borderline personality disorder: Secondary | ICD-10-CM | POA: Diagnosis not present

## 2017-11-27 DIAGNOSIS — F314 Bipolar disorder, current episode depressed, severe, without psychotic features: Secondary | ICD-10-CM | POA: Diagnosis not present

## 2017-12-29 DIAGNOSIS — G4733 Obstructive sleep apnea (adult) (pediatric): Secondary | ICD-10-CM | POA: Diagnosis not present

## 2018-01-04 ENCOUNTER — Ambulatory Visit (INDEPENDENT_AMBULATORY_CARE_PROVIDER_SITE_OTHER): Payer: Medicare HMO | Admitting: Family Medicine

## 2018-01-04 ENCOUNTER — Encounter: Payer: Self-pay | Admitting: Family Medicine

## 2018-01-04 VITALS — BP 102/74 | HR 88 | Temp 98.8°F

## 2018-01-04 DIAGNOSIS — R6883 Chills (without fever): Secondary | ICD-10-CM | POA: Diagnosis not present

## 2018-01-04 DIAGNOSIS — J029 Acute pharyngitis, unspecified: Secondary | ICD-10-CM

## 2018-01-04 DIAGNOSIS — J101 Influenza due to other identified influenza virus with other respiratory manifestations: Secondary | ICD-10-CM | POA: Diagnosis not present

## 2018-01-04 LAB — POCT INFLUENZA A/B
INFLUENZA A, POC: POSITIVE — AB
Influenza B, POC: NEGATIVE

## 2018-01-04 LAB — POCT RAPID STREP A (OFFICE): Rapid Strep A Screen: NEGATIVE

## 2018-01-04 MED ORDER — OSELTAMIVIR PHOSPHATE 75 MG PO CAPS: 75.0000 mg | ORAL_CAPSULE | Freq: Two times a day (BID) | ORAL | 0 refills | Status: AC

## 2018-01-04 NOTE — Patient Instructions (Signed)

## 2018-01-04 NOTE — Progress Notes (Signed)
Subjective:    Patient ID: Tonya [name], female    DOB: 1985/10/07, 32 y.o.   MRN: [id number]  No chief complaint on file.   HPI Patient was seen today for acute concern.  Pt endorses sore throat, rhinorrhea, coughing, chills, increased sweating, muscle aches, headache x2 days.  Pt notes episode of blood-tinged sputum after coughing.  Pt denies ear pain or pressure, facial pain or pressure, nausea, vomiting.  Pt is taking cough drops and increased her fluids.  Sick contacts include people at school.  Pt has finals next wk.  Past Medical History:  Diagnosis Date  . Bipolar 1 disorder ([place])   . Depression   . OSA (obstructive sleep apnea) 08/20/2014  . Suicide attempt ([place])   . Transfusion of blood during current hospitalization    SUMMER 2013  . UTI (lower urinary tract infection) 10/20/11   being treated, has 4 doses abx left    Allergies  Allergen Reactions  . Shellfish Allergy     Throat feels like it is swelling.    ROS General: Denies fever, changes in weight, changes in appetite  +chills, diaphoresis HEENT: Denies ear pain, changes in vision +rhinorrhea, sore throat, HAs CV: Denies CP, palpitations, SOB, orthopnea Pulm: Denies SOB, wheezing  +cough GI: Denies abdominal pain, nausea, vomiting, diarrhea, constipation GU: Denies dysuria, hematuria, frequency, vaginal discharge Msk: Denies muscle cramps, joint pains  +muscle aches Neuro: Denies weakness, numbness, tingling Skin: Denies rashes, bruising Psych: Denies depression, anxiety, hallucinations      Objective:    Blood pressure 102/74, pulse 88, temperature 98.8 F (37.1 C), temperature source Oral, SpO2 98 %.  Gen. Pleasant, well-nourished, in no distress, normal affect   HEENT: [place]/AT, face symmetric, no scleral icterus, PERRLA, nares patent with clear drainage, pharynx with erythema and small area of exudate.  TMs nml b/l.  No cervical lymphadenopathy. Lungs: Coughing, no accessory muscle use, CTAB, no  wheezes or rales Cardiovascular: RRR, no m/r/g, no peripheral edema Neuro:  A&Ox3, CN II-XII intact, R BKA with prosthesis  Wt Readings from Last 3 Encounters:  07/27/17 (!) 311 lb (141.1 kg)  05/01/17 (!) 311 lb 9.6 oz (141.3 kg)  02/28/17 282 lb (127.9 kg)    Lab Results  Component Value Date   WBC 11.0 (H) 01/10/2017   HGB 13.4 01/10/2017   HCT 39.1 01/10/2017   PLT 278 01/10/2017   GLUCOSE 82 01/10/2017   ALT 39 01/10/2017   AST 57 (H) 01/10/2017   NA 144 01/10/2017   K 3.6 01/10/2017   CL 107 01/10/2017   CREATININE 0.95 01/10/2017   BUN 13 01/10/2017   CO2 24 01/10/2017   TSH 2.579 07/21/2011   INR 0.95 04/03/2012   HGBA1C 5.6 06/27/2011    Assessment/Plan:  Influenza A  -Discussed supportive care -Given handout -Given note for school - Plan: oseltamivir (TAMIFLU) 75 MG capsule -Given RTC or ED precautions for worsening symptoms.  Chills  - Plan: POC Influenza A/B     positive for influenza A  Sore throat  - Plan: POCT rapid strep A   Negative  F/u prn  Grier Mitts, MD

## 2018-01-15 DIAGNOSIS — F603 Borderline personality disorder: Secondary | ICD-10-CM | POA: Diagnosis not present

## 2018-01-15 DIAGNOSIS — F314 Bipolar disorder, current episode depressed, severe, without psychotic features: Secondary | ICD-10-CM | POA: Diagnosis not present

## 2018-01-16 DIAGNOSIS — F314 Bipolar disorder, current episode depressed, severe, without psychotic features: Secondary | ICD-10-CM | POA: Diagnosis not present

## 2018-01-16 DIAGNOSIS — F603 Borderline personality disorder: Secondary | ICD-10-CM | POA: Diagnosis not present

## 2018-01-29 ENCOUNTER — Ambulatory Visit (INDEPENDENT_AMBULATORY_CARE_PROVIDER_SITE_OTHER): Payer: Medicare HMO | Admitting: *Deleted

## 2018-01-29 DIAGNOSIS — Z111 Encounter for screening for respiratory tuberculosis: Secondary | ICD-10-CM | POA: Diagnosis not present

## 2018-01-29 NOTE — Progress Notes (Signed)
Per orders of Dr. [place], injection of PPD given by Westley Hummer. Patient tolerated injection well.

## 2018-01-31 LAB — TB SKIN TEST
INDURATION: 0 mm
TB SKIN TEST: NEGATIVE

## 2018-02-05 DIAGNOSIS — F411 Generalized anxiety disorder: Secondary | ICD-10-CM | POA: Diagnosis not present

## 2018-02-05 DIAGNOSIS — F603 Borderline personality disorder: Secondary | ICD-10-CM | POA: Diagnosis not present

## 2018-02-05 DIAGNOSIS — F3181 Bipolar II disorder: Secondary | ICD-10-CM | POA: Diagnosis not present

## 2018-02-05 DIAGNOSIS — F4312 Post-traumatic stress disorder, chronic: Secondary | ICD-10-CM | POA: Diagnosis not present

## 2018-02-18 ENCOUNTER — Encounter (HOSPITAL_COMMUNITY): Payer: Self-pay

## 2018-02-18 ENCOUNTER — Telehealth (HOSPITAL_COMMUNITY): Payer: Self-pay | Admitting: Emergency Medicine

## 2018-02-18 ENCOUNTER — Other Ambulatory Visit: Payer: Self-pay

## 2018-02-18 ENCOUNTER — Ambulatory Visit (HOSPITAL_COMMUNITY)
Admission: EM | Admit: 2018-02-18 | Discharge: 2018-02-18 | Disposition: A | Discharge status: Home / Self Care | Payer: Medicare HMO | Attending: Family Medicine | Admitting: Family Medicine

## 2018-02-18 DIAGNOSIS — J029 Acute pharyngitis, unspecified: Secondary | ICD-10-CM | POA: Diagnosis not present

## 2018-02-18 DIAGNOSIS — B9789 Other viral agents as the cause of diseases classified elsewhere: Secondary | ICD-10-CM | POA: Diagnosis not present

## 2018-02-18 DIAGNOSIS — J069 Acute upper respiratory infection, unspecified: Secondary | ICD-10-CM | POA: Diagnosis not present

## 2018-02-18 DIAGNOSIS — R05 Cough: Secondary | ICD-10-CM | POA: Diagnosis not present

## 2018-02-18 LAB — POCT RAPID STREP A: STREPTOCOCCUS, GROUP A SCREEN (DIRECT): NEGATIVE

## 2018-02-18 MED ORDER — CETIRIZINE HCL 10 MG PO CAPS: 10.0000 mg | ORAL_CAPSULE | Freq: Every day | ORAL | 0 refills | Status: AC

## 2018-02-18 MED ORDER — PSEUDOEPH-BROMPHEN-DM 30-2-10 MG/5ML PO SYRP: 5.0000 mL | ORAL_SOLUTION | Freq: Four times a day (QID) | ORAL | 0 refills | Status: DC | PRN

## 2018-02-18 MED ORDER — IBUPROFEN 600 MG PO TABS: 600.0000 mg | ORAL_TABLET | Freq: Four times a day (QID) | ORAL | 0 refills | Status: DC | PRN

## 2018-02-18 NOTE — ED Triage Notes (Signed)
Pt has a sore throat x 2 days and coughing as well.

## 2018-02-18 NOTE — Discharge Instructions (Addendum)
Sore Throat  Your rapid strep tested Negative today. We will send for a culture and call in about 2 days if results are positive. For now we will treat your sore throat as a virus with symptom management.   Please continue Tylenol or Ibuprofen for fever and pain. May try salt water gargles, cepacol lozenges, throat spray, or OTC cold relief medicine for throat discomfort. If you also have congestion take a daily anti-histamine like Zyrtec, Claritin, and a oral decongestant to help with post nasal drip that may be irritating your throat.   For cough you may use a cough syrup provided or over-the-counter Delsym/Robitussin  Stay hydrated and drink plenty of fluids to keep your throat coated relieve irritation.
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
## 2018-02-20 LAB — CULTURE, GROUP A STREP (THRC)

## 2018-03-07 ENCOUNTER — Encounter: Payer: Self-pay | Admitting: Family Medicine

## 2018-03-07 ENCOUNTER — Ambulatory Visit (INDEPENDENT_AMBULATORY_CARE_PROVIDER_SITE_OTHER): Payer: Medicare HMO | Admitting: Family Medicine

## 2018-03-07 VITALS — BP 110/72 | HR 72 | Temp 98.7°F

## 2018-03-07 DIAGNOSIS — R509 Fever, unspecified: Secondary | ICD-10-CM

## 2018-03-07 DIAGNOSIS — J40 Bronchitis, not specified as acute or chronic: Secondary | ICD-10-CM

## 2018-03-07 LAB — POCT INFLUENZA A/B
Influenza A, POC: NEGATIVE
Influenza B, POC: NEGATIVE

## 2018-03-07 MED ORDER — BENZONATATE 100 MG PO CAPS: 100.0000 mg | ORAL_CAPSULE | Freq: Two times a day (BID) | ORAL | 0 refills | Status: DC | PRN

## 2018-03-07 MED ORDER — AMOXICILLIN 875 MG PO TABS: 875.0000 mg | ORAL_TABLET | Freq: Two times a day (BID) | ORAL | 0 refills | Status: AC

## 2018-03-07 NOTE — Addendum Note (Signed)
Addended by: Wyvonne Lenz on: 03/07/2018 03:18 PM   Modules accepted: Orders

## 2018-03-07 NOTE — Patient Instructions (Signed)
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
## 2018-03-29 DIAGNOSIS — G4733 Obstructive sleep apnea (adult) (pediatric): Secondary | ICD-10-CM | POA: Diagnosis not present

## 2018-04-01 DIAGNOSIS — N926 Irregular menstruation, unspecified: Secondary | ICD-10-CM | POA: Diagnosis not present

## 2018-04-01 DIAGNOSIS — N921 Excessive and frequent menstruation with irregular cycle: Secondary | ICD-10-CM | POA: Diagnosis not present

## 2018-04-29 DIAGNOSIS — F411 Generalized anxiety disorder: Secondary | ICD-10-CM | POA: Diagnosis not present

## 2018-04-29 DIAGNOSIS — F314 Bipolar disorder, current episode depressed, severe, without psychotic features: Secondary | ICD-10-CM | POA: Diagnosis not present

## 2018-04-29 DIAGNOSIS — F603 Borderline personality disorder: Secondary | ICD-10-CM | POA: Diagnosis not present

## 2018-04-29 DIAGNOSIS — F3181 Bipolar II disorder: Secondary | ICD-10-CM | POA: Diagnosis not present

## 2018-04-29 DIAGNOSIS — F4312 Post-traumatic stress disorder, chronic: Secondary | ICD-10-CM | POA: Diagnosis not present

## 2018-05-31 ENCOUNTER — Other Ambulatory Visit: Payer: Self-pay

## 2018-05-31 ENCOUNTER — Ambulatory Visit (INDEPENDENT_AMBULATORY_CARE_PROVIDER_SITE_OTHER): Payer: Medicare HMO | Admitting: Pulmonary Disease

## 2018-05-31 ENCOUNTER — Encounter: Payer: Self-pay | Admitting: Pulmonary Disease

## 2018-05-31 DIAGNOSIS — G4733 Obstructive sleep apnea (adult) (pediatric): Secondary | ICD-10-CM

## 2018-05-31 NOTE — Patient Instructions (Signed)
You missed her scheduled tele-visit with our office on 05/31/2018 at 1:30 PM  Please contact our office at [phone] and please schedule a follow-up office visit with Wyn Quaker, FNP or 1 of the other APP's regarding your obstructive sleep apnea  Wyn Quaker, FNP

## 2018-05-31 NOTE — Progress Notes (Signed)
Virtual Visit via Telephone Note  I connected with Tonya [name] on 05/31/18 at  1:30 PM EDT by telephone and verified that I am speaking with the correct person using two identifiers.   I discussed the limitations, risks, security and privacy concerns of performing an evaluation and management service by telephone and the availability of in person appointments. I also discussed with the patient that there may be a patient responsible charge related to this service. The patient expressed understanding and agreed to proceed.  05/31/2018 - 1317 - Attempted - no vm  05/31/2018 - 1318 - Attempted - no VM  05/31/2018 - 1323 - Attempted - no VM   History of Present Illness: 33 year old followed in our office for osa  Pt of Dr. Halford [name]   Patient consented to consult via telephone: People present and their role in pt care:  Chief complaint:   Attempted to reach patient 3 times.  Unable to reach.  No voicemail set up.   Observations/Objective:  PSG 08/14/14 >>AHI 30.1, SaO2 low 73%. CPAP 11 cm H2O.  No results found for: NITRICOXIDE    Assessment and Plan:  No problem-specific Assessment & Plan notes found for this encounter.    Follow Up Instructions:  Return if symptoms worsen or fail to improve, for Follow up with Tonya Quaker FNP-C.    I discussed the assessment and treatment plan with the patient. The patient was provided an opportunity to ask questions and all were answered. The patient agreed with the plan and demonstrated an understanding of the instructions.   The patient was advised to call back or seek an in-person evaluation if the symptoms worsen or if the condition fails to improve as anticipated.  I provided 0 minutes of non-face-to-face time during this encounter.   Lauraine Rinne, NP

## 2018-06-04 ENCOUNTER — Ambulatory Visit (INDEPENDENT_AMBULATORY_CARE_PROVIDER_SITE_OTHER): Payer: Medicare HMO | Admitting: Pulmonary Disease

## 2018-06-04 ENCOUNTER — Telehealth: Payer: Self-pay | Admitting: Pulmonary Disease

## 2018-06-04 ENCOUNTER — Other Ambulatory Visit: Payer: Self-pay

## 2018-06-04 DIAGNOSIS — G4733 Obstructive sleep apnea (adult) (pediatric): Secondary | ICD-10-CM

## 2018-06-04 NOTE — Telephone Encounter (Signed)
ATC as well line rang busy. 3:49 pm

## 2018-06-04 NOTE — Telephone Encounter (Signed)
06/04/2018 1540  I have attempted to reach the patient multiple times today.  The telephone number that we have on file continues to ring is busy.  I have attempted via my telephone through the docs [facility] app as well as through our office telephone here.  There is no ability to leave a voicemail.  Triage, We need to start contacting the patient on 06/05/2018 to reschedule her appointment.  I would favor the patient completing a in office visit as were having such difficulty completing tele-visits with her.  Wyn Quaker, FNP

## 2018-06-04 NOTE — Progress Notes (Signed)
Virtual Visit via Telephone Note     06/04/2018 1441 - attempt - busy  06/04/2018 1442 - attempt - busy  06/04/2018 1524 - attempt - busy   History of Present Illness: 33 year old followed in our office for osa  Pt of Dr. Halford Chessman   Patient consented to consult via telephone: People present and their role in pt care:  Chief complaint:   UNABLE TO REACH PATIENT    Observations/Objective:  PSG 08/14/14 >>AHI 30.1, SaO2 low 73%. CPAP 11 cm H2O.   No results found for: NITRICOXIDE    Assessment and Plan:  No problem-specific Assessment & Plan notes found for this encounter.    Follow Up Instructions:  No follow-ups on file.    I discussed the assessment and treatment plan with the patient. The patient was provided an opportunity to ask questions and all were answered. The patient agreed with the plan and demonstrated an understanding of the instructions.   The patient was advised to call back or seek an in-person evaluation if the symptoms worsen or if the condition fails to improve as anticipated.  I provided 0 minutes of non-face-to-face time during this encounter.   Lauraine Rinne, NP

## 2018-06-10 NOTE — Telephone Encounter (Signed)
ATC patient, same thing with instant busy signal. There is no DPR on file to call another person.  Will close message and wait for patient to call us.

## 2018-06-27 DIAGNOSIS — G4733 Obstructive sleep apnea (adult) (pediatric): Secondary | ICD-10-CM | POA: Diagnosis not present

## 2018-07-18 DIAGNOSIS — F314 Bipolar disorder, current episode depressed, severe, without psychotic features: Secondary | ICD-10-CM | POA: Diagnosis not present

## 2018-07-18 DIAGNOSIS — F603 Borderline personality disorder: Secondary | ICD-10-CM | POA: Diagnosis not present

## 2018-07-29 DIAGNOSIS — F603 Borderline personality disorder: Secondary | ICD-10-CM | POA: Diagnosis not present

## 2018-07-29 DIAGNOSIS — F3181 Bipolar II disorder: Secondary | ICD-10-CM | POA: Diagnosis not present

## 2018-07-29 DIAGNOSIS — F411 Generalized anxiety disorder: Secondary | ICD-10-CM | POA: Diagnosis not present

## 2018-07-29 DIAGNOSIS — F4312 Post-traumatic stress disorder, chronic: Secondary | ICD-10-CM | POA: Diagnosis not present

## 2018-07-30 DIAGNOSIS — F314 Bipolar disorder, current episode depressed, severe, without psychotic features: Secondary | ICD-10-CM | POA: Diagnosis not present

## 2018-07-30 DIAGNOSIS — F603 Borderline personality disorder: Secondary | ICD-10-CM | POA: Diagnosis not present

## 2018-08-12 DIAGNOSIS — F314 Bipolar disorder, current episode depressed, severe, without psychotic features: Secondary | ICD-10-CM | POA: Diagnosis not present

## 2018-08-12 DIAGNOSIS — F603 Borderline personality disorder: Secondary | ICD-10-CM | POA: Diagnosis not present

## 2018-09-09 DIAGNOSIS — F603 Borderline personality disorder: Secondary | ICD-10-CM | POA: Diagnosis not present

## 2018-09-09 DIAGNOSIS — F314 Bipolar disorder, current episode depressed, severe, without psychotic features: Secondary | ICD-10-CM | POA: Diagnosis not present

## 2018-09-19 DIAGNOSIS — F603 Borderline personality disorder: Secondary | ICD-10-CM | POA: Diagnosis not present

## 2018-09-19 DIAGNOSIS — F314 Bipolar disorder, current episode depressed, severe, without psychotic features: Secondary | ICD-10-CM | POA: Diagnosis not present

## 2018-09-25 DIAGNOSIS — G4733 Obstructive sleep apnea (adult) (pediatric): Secondary | ICD-10-CM | POA: Diagnosis not present

## 2018-09-25 DIAGNOSIS — F314 Bipolar disorder, current episode depressed, severe, without psychotic features: Secondary | ICD-10-CM | POA: Diagnosis not present

## 2018-09-25 DIAGNOSIS — F603 Borderline personality disorder: Secondary | ICD-10-CM | POA: Diagnosis not present

## 2018-09-28 ENCOUNTER — Other Ambulatory Visit: Payer: Self-pay

## 2018-09-28 ENCOUNTER — Emergency Department (HOSPITAL_COMMUNITY)
Admission: EM | Admit: 2018-09-28 | Discharge: 2018-09-28 | Disposition: A | Discharge status: Home / Self Care | Payer: Medicare HMO | Attending: Emergency Medicine | Admitting: Emergency Medicine

## 2018-09-28 DIAGNOSIS — L299 Pruritus, unspecified: Secondary | ICD-10-CM | POA: Diagnosis not present

## 2018-09-28 DIAGNOSIS — R0602 Shortness of breath: Secondary | ICD-10-CM | POA: Diagnosis not present

## 2018-09-28 DIAGNOSIS — R21 Rash and other nonspecific skin eruption: Secondary | ICD-10-CM | POA: Diagnosis not present

## 2018-09-28 DIAGNOSIS — R0689 Other abnormalities of breathing: Secondary | ICD-10-CM | POA: Diagnosis not present

## 2018-09-28 DIAGNOSIS — I10 Essential (primary) hypertension: Secondary | ICD-10-CM | POA: Diagnosis not present

## 2018-09-28 DIAGNOSIS — T7840XA Allergy, unspecified, initial encounter: Secondary | ICD-10-CM | POA: Diagnosis not present

## 2018-09-28 DIAGNOSIS — T781XXA Other adverse food reactions, not elsewhere classified, initial encounter: Secondary | ICD-10-CM | POA: Diagnosis not present

## 2018-09-28 MED ORDER — FAMOTIDINE 20 MG PO TABS
40.0000 mg | ORAL_TABLET | Freq: Once | ORAL | Status: DC
  Filled 2018-09-28: qty 2

## 2018-09-28 MED ORDER — PREDNISONE 10 MG PO TABS: 40.0000 mg | ORAL_TABLET | Freq: Every day | ORAL | 0 refills | Status: AC

## 2018-09-28 MED ORDER — METHYLPREDNISOLONE SODIUM SUCC 125 MG IJ SOLR
125.0000 mg | Freq: Once | INTRAMUSCULAR | Status: AC
  Administered 2018-09-28: 125 mg via INTRAMUSCULAR
  Filled 2018-09-28: qty 2

## 2018-09-28 MED ORDER — EPINEPHRINE 0.3 MG/0.3ML IJ SOAJ: 0.3000 mg | INTRAMUSCULAR | 0 refills | Status: DC | PRN

## 2018-09-28 MED ORDER — DIPHENHYDRAMINE HCL 25 MG PO CAPS
25.0000 mg | ORAL_CAPSULE | Freq: Once | ORAL | Status: DC
  Filled 2018-09-28: qty 1

## 2018-09-28 NOTE — Discharge Instructions (Addendum)
Continue with prednisone, benadryl, pepcid at home. Monitor for rebound reaction after epi, return to ER if needed. Recheck with your doctor. Keep benadryl on hand with your epi pen. If you use your epi pen, also take 25-50mg  of benadryl and go to the ER.

## 2018-09-28 NOTE — ED Notes (Signed)
When this nurse went to administer the medications ordered for pt, pt expressed concerns about the cost of her ED visit and ambulance bill today. Pt refused the benadryl and pepcid saying she can take those at home for cheaper but agreed to the solu-medrol for its importance.

## 2018-09-28 NOTE — ED Notes (Signed)
Pt is anxious to leave and states she needs to go outside she does not like being in hospitals. Informed pt that nurse can not make her stay but leaving before she has been discharged by EDP is against medical advice. Pt states she will stay in her room until her ride gets here.

## 2018-09-28 NOTE — ED Notes (Signed)
Unable to obtain discharge signature due to equipment issue, pt given discharge papers and instructions and had no more questions or concerns, pt discharged without issue.
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
## 2018-09-28 NOTE — ED Triage Notes (Signed)
Pt presents to ED via [facility] coming from home, EMS reports pt administered her epi-pen prior to their arrival and that all of her symptoms had resolved upon their arrival. VS WNL during transport.

## 2018-09-29 ENCOUNTER — Encounter (HOSPITAL_COMMUNITY): Payer: Self-pay

## 2018-09-29 ENCOUNTER — Emergency Department (HOSPITAL_COMMUNITY)
Admission: EM | Admit: 2018-09-29 | Discharge: 2018-09-29 | Disposition: A | Discharge status: Home / Self Care | Payer: Medicare HMO | Attending: Emergency Medicine | Admitting: Emergency Medicine

## 2018-09-29 ENCOUNTER — Other Ambulatory Visit: Payer: Self-pay

## 2018-09-29 DIAGNOSIS — R0902 Hypoxemia: Secondary | ICD-10-CM | POA: Diagnosis not present

## 2018-09-29 DIAGNOSIS — T7840XA Allergy, unspecified, initial encounter: Secondary | ICD-10-CM | POA: Diagnosis not present

## 2018-09-29 DIAGNOSIS — R0689 Other abnormalities of breathing: Secondary | ICD-10-CM | POA: Diagnosis not present

## 2018-09-29 DIAGNOSIS — R0602 Shortness of breath: Secondary | ICD-10-CM | POA: Diagnosis not present

## 2018-09-29 DIAGNOSIS — R000 Tachycardia, unspecified: Secondary | ICD-10-CM | POA: Diagnosis not present

## 2018-09-29 DIAGNOSIS — Z209 Contact with and (suspected) exposure to unspecified communicable disease: Secondary | ICD-10-CM | POA: Diagnosis not present

## 2018-09-29 DIAGNOSIS — T781XXA Other adverse food reactions, not elsewhere classified, initial encounter: Secondary | ICD-10-CM | POA: Diagnosis not present

## 2018-09-29 NOTE — ED Triage Notes (Signed)
EMS reports reports Pt had almonds yesterday, used Epi at home, seen yesterday for same. After Dx, went home Pt states clean up almonds and had reaction again.Pt denies angio edema, respiratory distress or hives. Pt states her left arm is reddened and swollen.  BP 145/95 HR 96 RR 22 Sp02 98 RA
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
## 2018-10-01 ENCOUNTER — Emergency Department (HOSPITAL_COMMUNITY)
Admission: EM | Admit: 2018-10-01 | Discharge: 2018-10-02 | Disposition: A | Discharge status: Rehabilitation Facility | Payer: Medicare HMO | Attending: Emergency Medicine | Admitting: Emergency Medicine

## 2018-10-01 ENCOUNTER — Emergency Department (HOSPITAL_COMMUNITY): Payer: Medicare HMO

## 2018-10-01 ENCOUNTER — Other Ambulatory Visit: Payer: Self-pay

## 2018-10-01 ENCOUNTER — Encounter (HOSPITAL_COMMUNITY): Payer: Self-pay

## 2018-10-01 DIAGNOSIS — Z89611 Acquired absence of right leg above knee: Secondary | ICD-10-CM | POA: Insufficient documentation

## 2018-10-01 DIAGNOSIS — M79644 Pain in right finger(s): Secondary | ICD-10-CM | POA: Diagnosis not present

## 2018-10-01 DIAGNOSIS — Z046 Encounter for general psychiatric examination, requested by authority: Secondary | ICD-10-CM | POA: Diagnosis not present

## 2018-10-01 DIAGNOSIS — R4689 Other symptoms and signs involving appearance and behavior: Secondary | ICD-10-CM

## 2018-10-01 DIAGNOSIS — F918 Other conduct disorders: Secondary | ICD-10-CM | POA: Diagnosis not present

## 2018-10-01 DIAGNOSIS — R456 Violent behavior: Secondary | ICD-10-CM | POA: Diagnosis not present

## 2018-10-01 DIAGNOSIS — F312 Bipolar disorder, current episode manic severe with psychotic features: Secondary | ICD-10-CM | POA: Diagnosis not present

## 2018-10-01 DIAGNOSIS — R0902 Hypoxemia: Secondary | ICD-10-CM | POA: Diagnosis not present

## 2018-10-01 DIAGNOSIS — R001 Bradycardia, unspecified: Secondary | ICD-10-CM | POA: Diagnosis not present

## 2018-10-01 DIAGNOSIS — R404 Transient alteration of awareness: Secondary | ICD-10-CM | POA: Diagnosis not present

## 2018-10-01 DIAGNOSIS — F29 Unspecified psychosis not due to a substance or known physiological condition: Secondary | ICD-10-CM

## 2018-10-01 LAB — COMPREHENSIVE METABOLIC PANEL
ALT: 23 U/L (ref 0–44)
AST: 29 U/L (ref 15–41)
Albumin: 4 g/dL (ref 3.5–5.0)
Alkaline Phosphatase: 57 U/L (ref 38–126)
Anion gap: 14 (ref 5–15)
BUN: 13 mg/dL (ref 6–20)
CO2: 21 mmol/L — ABNORMAL LOW (ref 22–32)
Calcium: 8.9 mg/dL (ref 8.9–10.3)
Chloride: 110 mmol/L (ref 98–111)
Creatinine, Ser: 1.16 mg/dL — ABNORMAL HIGH (ref 0.44–1.00)
GFR calc Af Amer: >60 mL/min (ref >60)
GFR calc non Af Amer: >60 mL/min (ref >60)
Glucose, Bld: 121 mg/dL — ABNORMAL HIGH (ref 70–99)
Potassium: 3.3 mmol/L — ABNORMAL LOW (ref 3.5–5.1)
Sodium: 145 mmol/L (ref 135–145)
Total Bilirubin: 0.4 mg/dL (ref 0.3–1.2)
Total Protein: 7.8 g/dL (ref 6.5–8.1)

## 2018-10-01 LAB — CBC WITH DIFFERENTIAL/PLATELET
Abs Immature Granulocytes: 0.02 10*3/uL (ref 0.00–0.07)
Basophils Absolute: 0.1 10*3/uL (ref 0.0–0.1)
Basophils Relative: 1 %
Eosinophils Absolute: 0 10*3/uL (ref 0.0–0.5)
Eosinophils Relative: 0 %
HCT: 40.2 % (ref 36.0–46.0)
Hemoglobin: 13 g/dL (ref 12.0–15.0)
Immature Granulocytes: 0 %
Lymphocytes Relative: 23 %
Lymphs Abs: 1.8 10*3/uL (ref 0.7–4.0)
MCH: 29 pg (ref 26.0–34.0)
MCHC: 32.3 g/dL (ref 30.0–36.0)
MCV: 89.7 fL (ref 80.0–100.0)
Monocytes Absolute: 0.8 10*3/uL (ref 0.1–1.0)
Monocytes Relative: 10 %
Neutro Abs: 5.1 10*3/uL (ref 1.7–7.7)
Neutrophils Relative %: 66 %
Platelets: 243 10*3/uL (ref 150–400)
RBC: 4.48 MIL/uL (ref 3.87–5.11)
RDW: 14.4 % (ref 11.5–15.5)
WBC: 7.8 10*3/uL (ref 4.0–10.5)
nRBC: 0 % (ref 0.0–0.2)

## 2018-10-01 LAB — RAPID URINE DRUG SCREEN, HOSP PERFORMED
Amphetamines: NOT DETECTED
Barbiturates: NOT DETECTED
Benzodiazepines: POSITIVE — AB
Cocaine: NOT DETECTED
Opiates: NOT DETECTED
Tetrahydrocannabinol: NOT DETECTED

## 2018-10-01 LAB — I-STAT BETA HCG BLOOD, ED (MC, WL, AP ONLY): I-stat hCG, quantitative: <5 m[IU]/mL (ref <5)

## 2018-10-01 LAB — ETHANOL: Alcohol, Ethyl (B): <10 mg/dL (ref <10)

## 2018-10-01 MED ORDER — MIDAZOLAM HCL (PF) 10 MG/2ML IJ SOLN: 5.0000 mg | INTRAMUSCULAR | Status: DC | PRN

## 2018-10-01 MED ORDER — MIDAZOLAM HCL 2 MG/2ML IJ SOLN
5.0000 mg | Freq: Once | INTRAMUSCULAR | Status: DC
  Filled 2018-10-01: qty 6

## 2018-10-01 MED ORDER — ZIPRASIDONE MESYLATE 20 MG IM SOLR
20.0000 mg | Freq: Once | INTRAMUSCULAR | Status: AC
  Administered 2018-10-01: 20 mg via INTRAMUSCULAR
  Filled 2018-10-01: qty 20

## 2018-10-01 MED ORDER — STERILE WATER FOR INJECTION IJ SOLN
INTRAMUSCULAR | Status: AC
  Administered 2018-10-01: 1.2 mL
  Filled 2018-10-01: qty 10

## 2018-10-01 MED ORDER — MIDAZOLAM HCL 2 MG/2ML IJ SOLN
2.0000 mg | Freq: Once | INTRAMUSCULAR | Status: DC
  Filled 2018-10-01: qty 2

## 2018-10-01 MED ORDER — HALOPERIDOL LACTATE 5 MG/ML IJ SOLN
5.0000 mg | Freq: Once | INTRAMUSCULAR | Status: DC
  Filled 2018-10-01: qty 1

## 2018-10-01 MED ORDER — MIDAZOLAM HCL (PF) 5 MG/ML IJ SOLN
5.0000 mg | INTRAMUSCULAR | Status: AC | PRN
  Administered 2018-10-01 (×2): 5 mg via INTRAMUSCULAR
  Filled 2018-10-01 (×4): qty 1

## 2018-10-01 MED ORDER — MIDAZOLAM HCL (PF) 5 MG/ML IJ SOLN: 5.0000 mg | Freq: Once | INTRAMUSCULAR | Status: DC

## 2018-10-01 MED ORDER — LORAZEPAM 2 MG/ML IJ SOLN
2.0000 mg | Freq: Once | INTRAMUSCULAR | Status: AC
  Administered 2018-10-01: 02:00:00 2 mg via INTRAMUSCULAR
  Filled 2018-10-01: qty 1

## 2018-10-01 MED ORDER — DIPHENHYDRAMINE HCL 50 MG/ML IJ SOLN
50.0000 mg | Freq: Once | INTRAMUSCULAR | Status: AC
  Administered 2018-10-01: 50 mg via INTRAMUSCULAR
  Filled 2018-10-01: qty 1

## 2018-10-01 MED ORDER — MIDAZOLAM HCL 5 MG/5ML IJ SOLN: 5.0000 mg | Freq: Once | INTRAMUSCULAR | Status: DC
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
## 2018-10-01 NOTE — Care Management (Signed)
10/01/18  Under Nance [place]ew , [place], [place], [place], [place] , [place], [place], [place], [place], [place], [place], [employer], [place], [place], [place], [place], [place], [employer], [place], [place], [place], [place], [place], [place]

## 2018-10-01 NOTE — ED Notes (Addendum)
Verbal order for hard restraints given by Dr. Dayna Barker
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
## 2018-10-01 NOTE — ED Notes (Signed)
Pt is calm and asleep, pt is in 3 pt restraints, pt place on purewick.

## 2018-10-01 NOTE — ED Notes (Signed)
No answer from TTS when attempting to call.

## 2018-10-01 NOTE — ED Notes (Signed)
Pt attempting to get out restraints. Pt violent towards staff members and yelling as well as spitting at staff. Security at bedside.

## 2018-10-01 NOTE — ED Notes (Addendum)
Pt found sitting up in the bed and attempting to get her restraints off, security called to bedside for assistance, seizure pads placed for safety and MD made aware. Pt attempting to hit, bite, and spit on staff.

## 2018-10-01 NOTE — ED Notes (Signed)
Security and [employer] remain at bedside.

## 2018-10-01 NOTE — ED Notes (Signed)
Pt is calm and cooperative at this time. Pt 3 pt restraints have been remove. Pt was given leg prosthetic in order to ambulate about her room, and to bathroom. Pt given meal, and discussed with pt behavior that  Is expected. Pt verbalized understanding and states that she will not be violent. Pt has followed commands and instructions at this time. Pt dressed out in paper scrubs at this time.

## 2018-10-01 NOTE — Care Management (Signed)
Per Jonelle Sidle, patient accepted at [facility] Date and time is 10/02/18 after 9am  Accepting MD is Dr. Monico Hoar  The number to give [phone]  Writer informed the RN Celest
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
## 2018-10-01 NOTE — ED Notes (Signed)
Pt began yelling, when staff arrived she was sitting up in the bed and able to unhook her left wrist restraint. When staff attempted to redirect the patient she began yelling and biting at staff. Pt was attempting to unhook her right wrist, security had been called and arrived before patient was able. Security assisted patient to the bed and restraints were reapplied. MD made aware of situation.

## 2018-10-01 NOTE — ED Notes (Signed)
Pt aggressive and combative, attempting to get out of restraints. Security and [employer] at bedside attempting to help staff calm patient down and de-escalate. Meds have been ordered but pt needs to be assessed by TTS first before meds can be given.
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
## 2018-10-01 NOTE — ED Notes (Signed)
PT c/o RT hand middle finger swelling, and pain.

## 2018-10-01 NOTE — ED Notes (Addendum)
Pt attempted to again get out of restraints. Pt sitting upright while attempting to get left hand free of restraints. Pt's restraints reapplied and security at bedside. MD aware. Verbal order to use prn order for 5mg  Versed IM.
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
## 2018-10-01 NOTE — ED Notes (Signed)
Pt unable to be assessed by TTS at this time as pt is asleep, still in restraints. TTS reports to call back when pt is ready to be assessed.

## 2018-10-01 NOTE — ED Notes (Signed)
[facility] called and Stated that they would offer a bed to this patient, and stated that they would speak with  MSW.

## 2018-10-01 NOTE — ED Notes (Signed)
Buddy tape pt rt hand middle fingers

## 2018-10-01 NOTE — ED Notes (Signed)
Pt found attempting to get out of restraints again. Pt violent and attempting to get out of bed. Security and [employer] off-duty at bedside.

## 2018-10-01 NOTE — Care Management (Signed)
[facility] is reviewing the patient for placement.

## 2018-10-01 NOTE — ED Notes (Signed)
Pt found sitting up in the bed attempting to take restraints off again, when attempting to redirect patient became aggressive. Security called to bedside and MD made aware.

## 2018-10-01 NOTE — BH Assessment (Signed)
[facility] Assessment Progress Note    TTS attempted to see, unable to be aroused at this time

## 2018-10-01 NOTE — ED Notes (Signed)
While assisting with pt care, pt sat up in bed and attempted to get restraint off right wrist. Pt unsuccessful and pt repositioned in bed.

## 2018-10-02 DIAGNOSIS — Z915 Personal history of self-harm: Secondary | ICD-10-CM | POA: Diagnosis not present

## 2018-10-02 DIAGNOSIS — J45901 Unspecified asthma with (acute) exacerbation: Secondary | ICD-10-CM | POA: Diagnosis not present

## 2018-10-02 DIAGNOSIS — G4730 Sleep apnea, unspecified: Secondary | ICD-10-CM | POA: Diagnosis not present

## 2018-10-02 DIAGNOSIS — M79644 Pain in right finger(s): Secondary | ICD-10-CM | POA: Diagnosis not present

## 2018-10-02 DIAGNOSIS — F4310 Post-traumatic stress disorder, unspecified: Secondary | ICD-10-CM | POA: Diagnosis not present

## 2018-10-02 DIAGNOSIS — Z6841 Body Mass Index (BMI) 40.0 and over, adult: Secondary | ICD-10-CM | POA: Diagnosis not present

## 2018-10-02 DIAGNOSIS — J45909 Unspecified asthma, uncomplicated: Secondary | ICD-10-CM | POA: Diagnosis not present

## 2018-10-02 DIAGNOSIS — Z046 Encounter for general psychiatric examination, requested by authority: Secondary | ICD-10-CM | POA: Diagnosis not present

## 2018-10-02 DIAGNOSIS — F319 Bipolar disorder, unspecified: Secondary | ICD-10-CM | POA: Diagnosis not present

## 2018-10-02 DIAGNOSIS — Z87892 Personal history of anaphylaxis: Secondary | ICD-10-CM | POA: Diagnosis not present

## 2018-10-02 DIAGNOSIS — F312 Bipolar disorder, current episode manic severe with psychotic features: Secondary | ICD-10-CM | POA: Diagnosis not present

## 2018-10-02 DIAGNOSIS — Z89611 Acquired absence of right leg above knee: Secondary | ICD-10-CM | POA: Diagnosis not present

## 2018-10-02 MED ORDER — LURASIDONE HCL 20 MG PO TABS
20.0000 mg | ORAL_TABLET | Freq: Every day | ORAL | Status: DC
  Administered 2018-10-02: 20 mg via ORAL
  Filled 2018-10-02: qty 1

## 2018-10-02 MED ORDER — LURASIDONE HCL 20 MG PO TABS: ORAL_TABLET | Freq: Every day | ORAL | Status: DC

## 2018-10-02 NOTE — ED Notes (Signed)
Reported called to [facility], given to [employer]. [facility] [employer] called . Patient belongings to be sent with patient

## 2018-10-02 NOTE — ED Notes (Signed)
Patient constantly asking for ice packs for finger. And asking if some can "pop" finger back into place.

## 2018-10-02 NOTE — Progress Notes (Signed)
CSW spoke with patient via bedside after receiving call from the RN that patient wanted to speak with someone about not going to [facility] and discuss the [employer] (safety) plan she developed. Patient went over her plan with CSW that consisted of her following up with her medical and psych providers, paying her bills, and returning back to school. CSW validated that patient had a good plan; however, due to her being under IVC and already accepted to [facility] she will have to go. CSW spoke with Dr. Mariea Clonts, DO about the patient. Dr. Mariea Clonts reports that she will not follow up with patient/consider rescinding her IVC as patient is high risk and already has been accepted to [facility].   CSW recommended to patient that she speak with [facility] about her treatment plan to find out how long she may be there. Patient was not happy that she had to go, but ultimately understood that because she was under IVC we could not send her home as she preferred.  CSW made RN aware to start on processing patient's transport to [facility]. CSW signing off.   Golden Circle, LCSW Transitions of Care Department [facility] ED [phone]

## 2018-10-15 ENCOUNTER — Telehealth: Payer: Self-pay

## 2018-10-15 NOTE — Telephone Encounter (Signed)
Copied from [place] [id number]. Topic: Appointment Scheduling - Scheduling Inquiry for Clinic >> Oct 11, 2018 12:49 PM Erick Blinks wrote: Reason for CRM: Pt called to schedule Tonya [name], please advise if sooner is available  Best contact: [phone]

## 2018-10-15 NOTE — Telephone Encounter (Signed)
Pt has a virtual appointment scheduled for 10/24/2018 at 2.30pm

## 2018-10-16 DIAGNOSIS — F314 Bipolar disorder, current episode depressed, severe, without psychotic features: Secondary | ICD-10-CM | POA: Diagnosis not present

## 2018-10-16 DIAGNOSIS — F603 Borderline personality disorder: Secondary | ICD-10-CM | POA: Diagnosis not present

## 2018-10-17 DIAGNOSIS — F4312 Post-traumatic stress disorder, chronic: Secondary | ICD-10-CM | POA: Diagnosis not present

## 2018-10-17 DIAGNOSIS — F3181 Bipolar II disorder: Secondary | ICD-10-CM | POA: Diagnosis not present

## 2018-10-17 DIAGNOSIS — F603 Borderline personality disorder: Secondary | ICD-10-CM | POA: Diagnosis not present

## 2018-10-17 DIAGNOSIS — F411 Generalized anxiety disorder: Secondary | ICD-10-CM | POA: Diagnosis not present

## 2018-10-24 ENCOUNTER — Other Ambulatory Visit: Payer: Self-pay

## 2018-10-24 ENCOUNTER — Ambulatory Visit (INDEPENDENT_AMBULATORY_CARE_PROVIDER_SITE_OTHER): Payer: Medicare HMO | Admitting: Family Medicine

## 2018-10-24 DIAGNOSIS — Z91018 Allergy to other foods: Secondary | ICD-10-CM | POA: Diagnosis not present

## 2018-10-24 DIAGNOSIS — F3181 Bipolar II disorder: Secondary | ICD-10-CM | POA: Diagnosis not present

## 2018-10-24 DIAGNOSIS — F3010 Manic episode without psychotic symptoms, unspecified: Secondary | ICD-10-CM | POA: Diagnosis not present

## 2018-10-24 MED ORDER — EPINEPHRINE 0.3 MG/0.3ML IJ SOAJ: 0.3000 mg | INTRAMUSCULAR | 5 refills | Status: AC | PRN
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
## 2018-10-25 DIAGNOSIS — F603 Borderline personality disorder: Secondary | ICD-10-CM | POA: Diagnosis not present

## 2018-10-25 DIAGNOSIS — F314 Bipolar disorder, current episode depressed, severe, without psychotic features: Secondary | ICD-10-CM | POA: Diagnosis not present

## 2018-10-28 ENCOUNTER — Encounter: Payer: Self-pay | Admitting: Family Medicine

## 2018-11-07 DIAGNOSIS — F314 Bipolar disorder, current episode depressed, severe, without psychotic features: Secondary | ICD-10-CM | POA: Diagnosis not present

## 2018-11-07 DIAGNOSIS — F603 Borderline personality disorder: Secondary | ICD-10-CM | POA: Diagnosis not present

## 2018-11-21 DIAGNOSIS — F314 Bipolar disorder, current episode depressed, severe, without psychotic features: Secondary | ICD-10-CM | POA: Diagnosis not present

## 2018-11-21 DIAGNOSIS — F603 Borderline personality disorder: Secondary | ICD-10-CM | POA: Diagnosis not present

## 2018-12-19 DIAGNOSIS — F603 Borderline personality disorder: Secondary | ICD-10-CM | POA: Diagnosis not present

## 2018-12-19 DIAGNOSIS — F411 Generalized anxiety disorder: Secondary | ICD-10-CM | POA: Diagnosis not present

## 2018-12-19 DIAGNOSIS — F314 Bipolar disorder, current episode depressed, severe, without psychotic features: Secondary | ICD-10-CM | POA: Diagnosis not present

## 2018-12-19 DIAGNOSIS — F4312 Post-traumatic stress disorder, chronic: Secondary | ICD-10-CM | POA: Diagnosis not present

## 2018-12-19 DIAGNOSIS — F3181 Bipolar II disorder: Secondary | ICD-10-CM | POA: Diagnosis not present

## 2018-12-25 DIAGNOSIS — G4733 Obstructive sleep apnea (adult) (pediatric): Secondary | ICD-10-CM | POA: Diagnosis not present

## 2019-01-20 DIAGNOSIS — F314 Bipolar disorder, current episode depressed, severe, without psychotic features: Secondary | ICD-10-CM | POA: Diagnosis not present

## 2019-01-20 DIAGNOSIS — F603 Borderline personality disorder: Secondary | ICD-10-CM | POA: Diagnosis not present

## 2019-01-27 ENCOUNTER — Encounter: Payer: Self-pay | Admitting: Family Medicine

## 2019-03-10 DIAGNOSIS — F3181 Bipolar II disorder: Secondary | ICD-10-CM | POA: Diagnosis not present

## 2019-03-10 DIAGNOSIS — F411 Generalized anxiety disorder: Secondary | ICD-10-CM | POA: Diagnosis not present

## 2019-03-10 DIAGNOSIS — F603 Borderline personality disorder: Secondary | ICD-10-CM | POA: Diagnosis not present

## 2019-03-10 DIAGNOSIS — F4312 Post-traumatic stress disorder, chronic: Secondary | ICD-10-CM | POA: Diagnosis not present

## 2019-03-25 DIAGNOSIS — G4733 Obstructive sleep apnea (adult) (pediatric): Secondary | ICD-10-CM | POA: Diagnosis not present

## 2019-04-16 ENCOUNTER — Encounter: Payer: Self-pay | Admitting: Family Medicine

## 2019-04-18 ENCOUNTER — Encounter: Payer: Self-pay | Admitting: Family Medicine

## 2019-04-29 ENCOUNTER — Telehealth: Payer: Self-pay

## 2019-04-29 NOTE — Telephone Encounter (Signed)
Pt form completed and was faxed to [employer], confirmation received, pt notified

## 2019-05-26 DIAGNOSIS — F314 Bipolar disorder, current episode depressed, severe, without psychotic features: Secondary | ICD-10-CM | POA: Diagnosis not present

## 2019-05-26 DIAGNOSIS — F603 Borderline personality disorder: Secondary | ICD-10-CM | POA: Diagnosis not present

## 2019-06-02 DIAGNOSIS — F4312 Post-traumatic stress disorder, chronic: Secondary | ICD-10-CM | POA: Diagnosis not present

## 2019-06-02 DIAGNOSIS — F3181 Bipolar II disorder: Secondary | ICD-10-CM | POA: Diagnosis not present

## 2019-06-02 DIAGNOSIS — F411 Generalized anxiety disorder: Secondary | ICD-10-CM | POA: Diagnosis not present

## 2019-06-02 DIAGNOSIS — F603 Borderline personality disorder: Secondary | ICD-10-CM | POA: Diagnosis not present

## 2019-06-24 DIAGNOSIS — G4733 Obstructive sleep apnea (adult) (pediatric): Secondary | ICD-10-CM | POA: Diagnosis not present

## 2019-06-25 DIAGNOSIS — F603 Borderline personality disorder: Secondary | ICD-10-CM | POA: Diagnosis not present

## 2019-06-25 DIAGNOSIS — F314 Bipolar disorder, current episode depressed, severe, without psychotic features: Secondary | ICD-10-CM | POA: Diagnosis not present

## 2019-07-04 ENCOUNTER — Other Ambulatory Visit: Payer: Self-pay

## 2019-07-04 ENCOUNTER — Ambulatory Visit (INDEPENDENT_AMBULATORY_CARE_PROVIDER_SITE_OTHER)
Admission: RE | Admit: 2019-07-04 | Discharge: 2019-07-04 | Disposition: A | Discharge status: Home / Self Care | Payer: Medicare HMO | Source: Ambulatory Visit

## 2019-07-04 DIAGNOSIS — R300 Dysuria: Secondary | ICD-10-CM

## 2019-07-04 MED ORDER — CEPHALEXIN 500 MG PO CAPS: 500.0000 mg | ORAL_CAPSULE | Freq: Two times a day (BID) | ORAL | 0 refills | Status: AC

## 2019-07-04 NOTE — ED Provider Notes (Signed)
Virtual Visit via Video Note:  Tonya [name]  initiated request for Telemedicine visit with [facility] team. I connected with [name]  on 07/04/2019 at 10:08 AM  for a synchronized telemedicine visit using a video enabled HIPPA compliant telemedicine application. I verified that I am speaking with [name]  using two identifiers. Sharion Balloon, NP  was physically located in a [facility]gent care site and [facility] was located at a different location.   The limitations of evaluation and management by telemedicine as well as the availability of in-person appointments were discussed. Patient was informed that she  may incur a bill ( including co-pay) for this virtual visit encounter. [place]  expressed understanding and gave verbal consent to proceed with virtual visit.     History of Present Illness:Tonya [name]  is a 34 y.o. female presents for evaluation of dysuria, dark urine, urinary frequency x 2-3 days.  She denies fever, chills, abdominal pain, back pain, vaginal discharge, pelvic pain, or other symptoms.  Treatment attempted at home with increased water intake.  She denies current pregnancy or breastfeeding.      Allergies  Allergen Reactions  . Almond Meal Anaphylaxis    Just found out today 09/28/18  . Shellfish Allergy     Throat feels like it is swelling.     Past Medical History:  Diagnosis Date  . Bipolar 1 disorder ([place])   . Depression   . OSA (obstructive sleep apnea) 08/20/2014  . Suicide attempt ([place])   . Transfusion of blood during current hospitalization    SUMMER 2013  . UTI (lower urinary tract infection) 10/20/11   being treated, has 4 doses abx left     Social History   Tobacco Use  . Smoking status: Never Smoker  . Smokeless tobacco: Never Used  Substance Use Topics  . Alcohol use: No    Alcohol/week: 0.0 standard drinks  . Drug use: No   ROS: as stated in HPI.  All other  systems reviewed and negative.      Observations/Objective: Physical Exam  VITALS: Patient denies fever. GENERAL: Alert, appears well and in no acute distress. HEENT: Atraumatic. Oral mucosa appears moist.  NECK: Normal movements of the head and neck. CARDIOPULMONARY: No increased WOB. Speaking in clear sentences. I:E ratio WNL.  MS: Moves all visible extremities without noticeable abnormality. PSYCH: Pleasant and cooperative, well-groomed. Speech normal rate and rhythm. Affect is appropriate. Insight and judgement are appropriate. Attention is focused, linear, and appropriate.  NEURO: CN grossly intact. Oriented as arrived to appointment on time with no prompting. Moves both UE equally.  SKIN: No obvious lesions, wounds, erythema, or cyanosis noted on face or hands.   Assessment and Plan:    ICD-10-CM   1. Dysuria  R30.0        Follow Up Instructions: Treating with 5 day course of Keflex.  Also discussed OTC Azo as needed for discomfort.  Instructed her to come to the urgent care to be seen in person or follow-up with her PCP if her symptoms are not improving.  Patient agrees to plan of care.      I discussed the assessment and treatment plan with the patient. The patient was provided an opportunity to ask questions and all were answered. The patient agreed with the plan and demonstrated an understanding of the instructions.   The patient was advised to call back or seek an in-person evaluation if  the symptoms worsen or if the condition fails to improve as anticipated.      Sharion Balloon, NP  07/04/2019 10:08 AM         Sharion Balloon, NP 07/04/19 1008

## 2019-07-04 NOTE — Discharge Instructions (Signed)
Take the antibiotic as directed.    Follow up with your primary care provider or come here to be seen in person if your symptoms are not improving.    

## 2019-07-14 DIAGNOSIS — F603 Borderline personality disorder: Secondary | ICD-10-CM | POA: Diagnosis not present

## 2019-07-14 DIAGNOSIS — F314 Bipolar disorder, current episode depressed, severe, without psychotic features: Secondary | ICD-10-CM | POA: Diagnosis not present

## 2019-07-15 DIAGNOSIS — N921 Excessive and frequent menstruation with irregular cycle: Secondary | ICD-10-CM | POA: Diagnosis not present

## 2019-07-15 DIAGNOSIS — Z113 Encounter for screening for infections with a predominantly sexual mode of transmission: Secondary | ICD-10-CM | POA: Diagnosis not present

## 2019-07-28 DIAGNOSIS — N912 Amenorrhea, unspecified: Secondary | ICD-10-CM | POA: Diagnosis not present

## 2019-08-05 DIAGNOSIS — F4312 Post-traumatic stress disorder, chronic: Secondary | ICD-10-CM | POA: Diagnosis not present

## 2019-08-05 DIAGNOSIS — F3181 Bipolar II disorder: Secondary | ICD-10-CM | POA: Diagnosis not present

## 2019-08-05 DIAGNOSIS — F314 Bipolar disorder, current episode depressed, severe, without psychotic features: Secondary | ICD-10-CM | POA: Diagnosis not present

## 2019-08-05 DIAGNOSIS — F411 Generalized anxiety disorder: Secondary | ICD-10-CM | POA: Diagnosis not present

## 2019-08-05 DIAGNOSIS — F603 Borderline personality disorder: Secondary | ICD-10-CM | POA: Diagnosis not present

## 2019-08-18 DIAGNOSIS — F314 Bipolar disorder, current episode depressed, severe, without psychotic features: Secondary | ICD-10-CM | POA: Diagnosis not present

## 2019-08-18 DIAGNOSIS — F603 Borderline personality disorder: Secondary | ICD-10-CM | POA: Diagnosis not present

## 2019-08-19 ENCOUNTER — Telehealth: Payer: Self-pay | Admitting: Family Medicine

## 2019-08-19 ENCOUNTER — Other Ambulatory Visit: Payer: Self-pay | Admitting: Family Medicine

## 2019-08-19 DIAGNOSIS — J4520 Mild intermittent asthma, uncomplicated: Secondary | ICD-10-CM

## 2019-08-19 MED ORDER — ALBUTEROL SULFATE HFA 108 (90 BASE) MCG/ACT IN AERS: 2.0000 | INHALATION_SPRAY | Freq: Four times a day (QID) | RESPIRATORY_TRACT | 1 refills | Status: DC | PRN

## 2019-08-19 NOTE — Telephone Encounter (Signed)
Rx sent in

## 2019-08-19 NOTE — Telephone Encounter (Signed)
albuterol (PROVENTIL HFA;VENTOLIN HFA) 108 (90 Base) MCG/ACT inhaler  [id number] [address], [place] [address] Phone:  [phone]  Fax:  [phone]

## 2019-08-19 NOTE — Telephone Encounter (Signed)
The patient wants to also revoke her Horace.  Please advise

## 2019-08-20 NOTE — Telephone Encounter (Signed)
Noted, pt has appt tomorrow with pcp.

## 2019-08-21 ENCOUNTER — Ambulatory Visit (INDEPENDENT_AMBULATORY_CARE_PROVIDER_SITE_OTHER): Payer: Medicare HMO | Admitting: Family Medicine

## 2019-08-21 ENCOUNTER — Encounter: Payer: Self-pay | Admitting: Family Medicine

## 2019-08-21 ENCOUNTER — Other Ambulatory Visit: Payer: Self-pay

## 2019-08-21 VITALS — BP 120/70 | HR 80 | Temp 98.8°F | Wt 308.0 lb

## 2019-08-21 DIAGNOSIS — N6011 Diffuse cystic mastopathy of right breast: Secondary | ICD-10-CM

## 2019-08-21 DIAGNOSIS — N6012 Diffuse cystic mastopathy of left breast: Secondary | ICD-10-CM | POA: Diagnosis not present

## 2019-08-21 DIAGNOSIS — J4520 Mild intermittent asthma, uncomplicated: Secondary | ICD-10-CM

## 2019-08-21 DIAGNOSIS — N644 Mastodynia: Secondary | ICD-10-CM

## 2019-08-21 MED ORDER — ALBUTEROL SULFATE HFA 108 (90 BASE) MCG/ACT IN AERS: 2.0000 | INHALATION_SPRAY | Freq: Four times a day (QID) | RESPIRATORY_TRACT | 5 refills | Status: DC | PRN
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
## 2019-08-21 NOTE — Patient Instructions (Addendum)
Continue to monitor for changes in your breast tenderness. Still having issues..  Please let us know and we will schedule an ultrasound.  Fibrocystic Breast Change  Fibrocystic breast changes are changes in breast tissue that can cause breasts to become swollen, lumpy, or painful. This can happen due to buildup of scar-like tissue (fibrous tissue) or the forming of fluid-filled lumps (cysts) in the breast. This is a common condition, and it is not cancerous (is benign). The exact cause is not known, but it seems to occur when women go through hormonal changes during their menstrual cycle. Fibrocystic breast changes can affect one or both breasts. What are the causes? The exact cause of fibrocystic breast changes is not known. However, this condition:  May be related to the female hormones estrogen and progesterone.  May be influenced by family traits that get passed from parent to child (genetics). What are the signs or symptoms? Symptoms of this condition may affect one or both breasts, and may include:  Tenderness, mild discomfort, or pain.  Swelling.  Rope-like tissue that can be felt when touching the breast.  Lumps in one or both breasts.  Changes in breast size. Breasts may get larger before the menstrual period and smaller after the menstrual period.  Green or dark brown discharge from the nipple. Symptoms are usually worse before menstrual periods start, and they get better toward the end of menstrual periods. How is this diagnosed? This condition is diagnosed based on your medical history and a physical exam of your breasts. You may also have tests, such as:  A breast X-ray (mammogram).  Ultrasound of your breasts.  MRI.  Removal of a breast tissue sample for testing (breast biopsy). This may be done if your health care provider thinks that something else may be causing changes in your breasts. How is this treated? Often, treatment is not needed for this condition. In  some cases, treatment may include:  Taking over-the-counter pain relievers to help lessen pain or discomfort.  Limiting or avoiding caffeine. Foods and beverages that contain caffeine include chocolate, soda, coffee, and tea.  Reducing sugar and fat in your diet. Your health care provider may also recommend:  A procedure to remove fluid from a cyst that is causing pain (fine needle aspiration).  Surgery to remove a cyst that is large or tender or does not go away. Follow these instructions at home:  Examine your breasts after every menstrual period. If you do not have menstrual periods, check your breasts on the first day of every month. Feel for changes in your breasts, such as: ? More tenderness. ? A new growth. ? A change in size. ? A change in an existing lump.  Take over-the-counter and prescription medicines only as told by your health care provider.  Wear a well-fitted support or sports bra, especially when exercising.  Decrease or avoid caffeine, fat, and sugar in your diet as directed by your health care provider. Contact a health care provider if:  You have fluid leaking from your nipple, especially if it is bloody.  You have new lumps or bumps in your breast.  Your breast becomes enlarged, red, and painful.  You have areas of your breast that pucker inward.  Your nipple appears flat or indented. Get help right away if:  You have redness of your breast and the redness is spreading. Summary  Fibrocystic breast changes are changes in breast tissue that can cause breasts to become swollen, lumpy, or painful.  This  condition may be related to the female hormones estrogen and progesterone.  With this condition, it is important to examine your breasts after every menstrual period. If you do not have menstrual periods, check your breasts on the first day of every month. This information is not intended to replace advice given to you by your health care provider. Make  sure you discuss any questions you have with your health care provider. Document Revised: 12/29/2016 Document Reviewed: 09/15/2015 Elsevier Patient Education  [place].  Breast Tenderness Breast tenderness is a common problem for women of all ages, but may also occur in men. Breast tenderness may range from mild discomfort to severe pain. In women, the pain usually comes and goes with the menstrual cycle, but it can also be constant. Breast tenderness has many possible causes, including hormone changes, infections, and taking certain medicines. You may have tests, such as a mammogram or an ultrasound, to check for any unusual findings. Having breast tenderness usually does not mean that you have breast cancer. Follow these instructions at home: Managing pain and discomfort   If directed, put ice to the painful area. To do this: ? Put ice in a plastic bag. ? Place a towel between your skin and the bag. ? Leave the ice on for 20 minutes, 2-3 times a day.  Wear a supportive bra, especially during exercise. You may also want to wear a supportive bra while sleeping if your breasts are very tender. Medicines  Take over-the-counter and prescription medicines only as told by your health care provider. If the cause of your pain is infection, you may be prescribed an antibiotic medicine.  If you were prescribed an antibiotic, take it as told by your health care provider. Do not stop taking the antibiotic even if you start to feel better. Eating and drinking  Your health care provider may recommend that you lessen the amount of fat in your diet. You can do this by: ? Limiting fried foods. ? Cooking foods using methods such as baking, boiling, grilling, and broiling.  Decrease the amount of caffeine in your diet. Instead, drink more water and choose caffeine-free drinks. General instructions   Keep a log of the days and times when your breasts are most tender.  Ask your health care  provider how to do breast exams at home. This will help you notice if you have an unusual growth or lump.  Keep all follow-up visits as told by your health care provider. This is important. Contact a health care provider if:  Any part of your breast is hard, red, and hot to the touch. This may be a sign of infection.  You are a woman and: ? Not breastfeeding and you have fluid, especially blood or pus, coming out of your nipples. ? Have a new or painful lump in your breast that remains after your menstrual period ends.  You have a fever.  Your pain does not improve or it gets worse.  Your pain is interfering with your daily activities. Summary  Breast tenderness may range from mild discomfort to severe pain.  Breast tenderness has many possible causes, including hormone changes, infections, and taking certain medicines.  It can be treated with ice, wearing a supportive bra, and medicines.  Make changes to your diet if told to by your health care provider. This information is not intended to replace advice given to you by your health care provider. Make sure you discuss any questions you have with your  health care provider. Document Revised: 06/10/2018 Document Reviewed: 06/10/2018 Elsevier Patient Education  [place].

## 2019-09-03 ENCOUNTER — Other Ambulatory Visit: Payer: Self-pay

## 2019-09-03 ENCOUNTER — Emergency Department (HOSPITAL_COMMUNITY)
Admission: EM | Admit: 2019-09-03 | Discharge: 2019-09-04 | Disposition: A | Discharge status: Home / Self Care | Payer: Medicare HMO | Attending: Emergency Medicine | Admitting: Emergency Medicine

## 2019-09-03 DIAGNOSIS — R456 Violent behavior: Secondary | ICD-10-CM | POA: Diagnosis not present

## 2019-09-03 DIAGNOSIS — R0902 Hypoxemia: Secondary | ICD-10-CM | POA: Diagnosis not present

## 2019-09-03 DIAGNOSIS — Z79899 Other long term (current) drug therapy: Secondary | ICD-10-CM | POA: Diagnosis not present

## 2019-09-03 DIAGNOSIS — Z20822 Contact with and (suspected) exposure to covid-19: Secondary | ICD-10-CM | POA: Diagnosis not present

## 2019-09-03 DIAGNOSIS — G4733 Obstructive sleep apnea (adult) (pediatric): Secondary | ICD-10-CM | POA: Insufficient documentation

## 2019-09-03 DIAGNOSIS — F3181 Bipolar II disorder: Secondary | ICD-10-CM | POA: Diagnosis present

## 2019-09-03 DIAGNOSIS — I10 Essential (primary) hypertension: Secondary | ICD-10-CM | POA: Diagnosis not present

## 2019-09-03 DIAGNOSIS — Z87440 Personal history of urinary (tract) infections: Secondary | ICD-10-CM | POA: Insufficient documentation

## 2019-09-03 DIAGNOSIS — R000 Tachycardia, unspecified: Secondary | ICD-10-CM | POA: Diagnosis not present

## 2019-09-03 DIAGNOSIS — F3010 Manic episode without psychotic symptoms, unspecified: Secondary | ICD-10-CM

## 2019-09-03 DIAGNOSIS — R52 Pain, unspecified: Secondary | ICD-10-CM | POA: Diagnosis not present

## 2019-09-03 LAB — RAPID URINE DRUG SCREEN, HOSP PERFORMED
Amphetamines: NOT DETECTED
Barbiturates: NOT DETECTED
Benzodiazepines: NOT DETECTED
Cocaine: NOT DETECTED
Opiates: NOT DETECTED
Tetrahydrocannabinol: NOT DETECTED

## 2019-09-03 LAB — COMPREHENSIVE METABOLIC PANEL
ALT: 27 U/L (ref 0–44)
AST: 34 U/L (ref 15–41)
Albumin: 4 g/dL (ref 3.5–5.0)
Alkaline Phosphatase: 52 U/L (ref 38–126)
Anion gap: 12 (ref 5–15)
BUN: 7 mg/dL (ref 6–20)
CO2: 23 mmol/L (ref 22–32)
Calcium: 9 mg/dL (ref 8.9–10.3)
Chloride: 105 mmol/L (ref 98–111)
Creatinine, Ser: 0.87 mg/dL (ref 0.44–1.00)
GFR calc Af Amer: >60 mL/min (ref >60)
GFR calc non Af Amer: >60 mL/min (ref >60)
Glucose, Bld: 136 mg/dL — ABNORMAL HIGH (ref 70–99)
Potassium: 3.4 mmol/L — ABNORMAL LOW (ref 3.5–5.1)
Sodium: 140 mmol/L (ref 135–145)
Total Bilirubin: 0.8 mg/dL (ref 0.3–1.2)
Total Protein: 7.5 g/dL (ref 6.5–8.1)

## 2019-09-03 LAB — CBC WITH DIFFERENTIAL/PLATELET
Abs Immature Granulocytes: 0.02 10*3/uL (ref 0.00–0.07)
Basophils Absolute: 0 10*3/uL (ref 0.0–0.1)
Basophils Relative: 1 %
Eosinophils Absolute: 0 10*3/uL (ref 0.0–0.5)
Eosinophils Relative: 0 %
HCT: 39.9 % (ref 36.0–46.0)
Hemoglobin: 13.1 g/dL (ref 12.0–15.0)
Immature Granulocytes: 0 %
Lymphocytes Relative: 26 %
Lymphs Abs: 2.2 10*3/uL (ref 0.7–4.0)
MCH: 28.8 pg (ref 26.0–34.0)
MCHC: 32.8 g/dL (ref 30.0–36.0)
MCV: 87.7 fL (ref 80.0–100.0)
Monocytes Absolute: 0.9 10*3/uL (ref 0.1–1.0)
Monocytes Relative: 11 %
Neutro Abs: 5.3 10*3/uL (ref 1.7–7.7)
Neutrophils Relative %: 62 %
Platelets: 256 10*3/uL (ref 150–400)
RBC: 4.55 MIL/uL (ref 3.87–5.11)
RDW: 14.1 % (ref 11.5–15.5)
WBC: 8.4 10*3/uL (ref 4.0–10.5)
nRBC: 0 % (ref 0.0–0.2)

## 2019-09-03 LAB — SARS CORONAVIRUS 2 BY RT PCR (HOSPITAL ORDER, PERFORMED IN ~~LOC~~ HOSPITAL LAB): SARS Coronavirus 2: NEGATIVE

## 2019-09-03 LAB — I-STAT BETA HCG BLOOD, ED (MC, WL, AP ONLY): I-stat hCG, quantitative: <5 m[IU]/mL (ref <5)

## 2019-09-03 LAB — ETHANOL: Alcohol, Ethyl (B): <10 mg/dL (ref <10)

## 2019-09-03 MED ORDER — STERILE WATER FOR INJECTION IJ SOLN
INTRAMUSCULAR | Status: AC
  Filled 2019-09-03: qty 10

## 2019-09-03 MED ORDER — ACETAMINOPHEN 325 MG PO TABS: 650.0000 mg | ORAL_TABLET | ORAL | Status: DC | PRN

## 2019-09-03 MED ORDER — HYDROXYZINE HCL 25 MG PO TABS
25.0000 mg | ORAL_TABLET | Freq: Every day | ORAL | Status: DC | PRN
  Administered 2019-09-03 – 2019-09-04 (×2): 25 mg via ORAL
  Filled 2019-09-03 (×2): qty 1

## 2019-09-03 MED ORDER — ALBUTEROL SULFATE HFA 108 (90 BASE) MCG/ACT IN AERS: 2.0000 | INHALATION_SPRAY | Freq: Four times a day (QID) | RESPIRATORY_TRACT | Status: DC | PRN

## 2019-09-03 MED ORDER — STERILE WATER FOR INJECTION IJ SOLN
INTRAMUSCULAR | Status: AC
  Administered 2019-09-03: 1.2 mL
  Filled 2019-09-03: qty 10

## 2019-09-03 MED ORDER — ZIPRASIDONE MESYLATE 20 MG IM SOLR
20.0000 mg | INTRAMUSCULAR | Status: AC | PRN
  Administered 2019-09-03: 20 mg via INTRAMUSCULAR
  Filled 2019-09-03: qty 20

## 2019-09-03 MED ORDER — ZIPRASIDONE MESYLATE 20 MG IM SOLR
INTRAMUSCULAR | Status: AC
  Filled 2019-09-03: qty 20

## 2019-09-03 MED ORDER — IPRATROPIUM BROMIDE 0.03 % NA SOLN: 2.0000 | Freq: Two times a day (BID) | NASAL | Status: DC

## 2019-09-03 MED ORDER — ZIPRASIDONE MESYLATE 20 MG IM SOLR
20.0000 mg | Freq: Once | INTRAMUSCULAR | Status: AC
  Administered 2019-09-03: 20 mg via INTRAMUSCULAR
  Filled 2019-09-03: qty 20

## 2019-09-03 MED ORDER — KETAMINE HCL 50 MG/ML IJ SOLN
400.0000 mg | Freq: Once | INTRAMUSCULAR | Status: DC | PRN
  Filled 2019-09-03: qty 10

## 2019-09-03 MED ORDER — RISPERIDONE 1 MG PO TBDP
2.0000 mg | ORAL_TABLET | Freq: Three times a day (TID) | ORAL | Status: DC | PRN
  Administered 2019-09-04: 2 mg via ORAL
  Filled 2019-09-03: qty 2

## 2019-09-03 MED ORDER — LURASIDONE HCL 20 MG PO TABS
40.0000 mg | ORAL_TABLET | Freq: Every day | ORAL | Status: DC
  Filled 2019-09-03: qty 2

## 2019-09-03 MED ORDER — LORAZEPAM 1 MG PO TABS
1.0000 mg | ORAL_TABLET | ORAL | Status: DC | PRN
  Filled 2019-09-03: qty 1

## 2019-09-03 NOTE — BH Assessment (Addendum)
Followed up with nursing staff-Jessica, RN to see if patient was alert enough to be seen. Per Janett Billow, patient is "In and out and not, not fully alert"
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
## 2019-09-03 NOTE — BH Assessment (Signed)
TTS ready to assess patient.   Upon review of chart, patient is in [facility] (hall bed). Clinician contacted Charge Nurse (Tim) requested patient to be placed in a private room to complete her TTS assessment. Per Camera operator, no private rooms are available at this time. TTS unable to complete patient's TTS assessment while she is in the hallway. TTS will continue to follow up and attempt to complete this patient's assessment.

## 2019-09-03 NOTE — ED Notes (Signed)
Pt laying supine in stretcher, restraints in place, security at bedside.  Pt observed trying to forcefully strain to remove right wrist restraint. Equal chest rise noted.

## 2019-09-03 NOTE — ED Notes (Signed)
Pt arrives to ED with no clothing, no personal belongings.  Pt with R BKA, no prosthetic on person.

## 2019-09-03 NOTE — ED Notes (Signed)
Pt seen shaking stretcher rails, shifting body weight to move down stretcher.  Pt able to position self so that she was unable to do restraints on BL wrists, then on ankle.  Pt attempted to fling body over rail to get out of the bed.  RN able to use STARR handling and keep pt in bed, pt began trying to bite, swinging arms at staff, spitting.  Security called to bedside, assisted in STARR handling pt to keep her in stretcher.   Restraints reapplied to pt.  Verbal order for 5-point restraint given by MD Nanavati.  Verbal order for 20 mg Geodon given by MD Nanavati.

## 2019-09-03 NOTE — ED Notes (Signed)
One set of keys on blue carabiner given to security for lock box.  Lock box key in pt bin for [employer].
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
## 2019-09-03 NOTE — ED Notes (Signed)
At this time, pt is alert and requesting to be released from 4-point restraints. Pt contracts for stafety and informed of reasons the restraints would be put back on. Pts right arm was released from restraint. Pt given food and fluids and is able to feed herself. Pt calm and cooperative at this time.

## 2019-09-03 NOTE — ED Notes (Signed)
Notified AC of need for replacement sitter, per [facility]- no adequately trained sitter is available at this time due to current available sitters not having STARR training.  Charge RN made aware .

## 2019-09-03 NOTE — ED Triage Notes (Addendum)
Pt BIBA along with [employer]-  Per EMS- EMS called to scene d/t pt being outside her apartment complex, grass, rolling around naked.  EMS reports pt only answering questioning about what her name is ("Tonya [name]", "Tonya [name]" "Tonya [name]").  Pt would not answer any other questions per EMS.   118/78 118 HR CBG 150 97.4 temporal 98% RA.   Pt reports "hurting everywhere"

## 2019-09-03 NOTE — ED Notes (Signed)
As previously noted, the pt contracted for safety and RN attempted to begin removing restraints, but pt did not comply with verbal agreement to release restraints. At this time, the pt began removing the other 3 restraints on the left wrist and both legs. Pt successfully removed all restraints including the posey belt. Security was called to bedside. Pt was cooperative when placed back in 4-point restraints.
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
## 2019-09-03 NOTE — ED Notes (Signed)
Pt again attempting to get out of bed, trying to bite staff, unable to redirect pt.

## 2019-09-03 NOTE — BH Assessment (Signed)
Attempted to complete patient's TTS assessment. However her nurse states that she was given Geodon and unable to be seen by TTS at this time. Nursing staff was asked to contact TTS when she able to participate in a TTS assessment
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
## 2019-09-04 NOTE — ED Notes (Signed)
Pt v/s updated and blue scrubs given due to pt arriving w/ no clothes. Pt requesting another blue scrub top and scissors so she can make a bra. I advised that we cannot give her scissors but that I would attempt to find another scrub top for her.

## 2019-09-04 NOTE — ED Notes (Signed)
Pt requesting to use RR. Pt was placed on a bed pan.  Upon returning to get pt off bed pan, sitter had already assisted pt with this. Pt has no gown on and is requesting door be kept closed. I advised we could not shut the door completely but would leave it cracked until she was clothed. Pt agreeable to this.

## 2019-09-04 NOTE — ED Notes (Signed)
Talked with pt about getting released. Pt agreed to have psych contact mother.

## 2019-09-04 NOTE — ED Notes (Signed)
Pt sitting up in bed. A&O x4 in arm restraints for safety. Pt voiced wanted to make a call to get a ride home. Educated pt on her status in the ED and that a ride will not be needed at this time. Pt became anxious as she believed she would be transferred out and requested her anxiety med. Sitter at bedside. Will reassess restraints at a later time.

## 2019-09-04 NOTE — Discharge Instructions (Signed)
For your behavioral health needs, you are advised to continue treatment at the [place]:       [facility]      [place], [place] [address]      [phone]

## 2019-09-04 NOTE — ED Notes (Signed)
Restraints assessed. L arm released. Pt given meal tray

## 2019-09-04 NOTE — BH Assessment (Addendum)
[place] Assessment Progress Note  Per Letitia Libra, NP, this pt does not require psychiatric hospitalization at this time.  Pt presents under IVC initiated by EDP Varney Biles, MD, which has been rescinded by Hampton Abbot, MD.  Pt is psychiatrically cleared.  Discharge instructions advise pt to continue treatment at the [place].  EDP Blanchie Dessert, MD and pt's nurse have been notified.  Jalene Mullet, [place] Triage Specialist [phone]

## 2019-09-04 NOTE — Progress Notes (Signed)
CSW received a request from the pt's RN to provide transport for the pt.  CSW was provided verbal permission from the pt to call pt's mother Scot Jun at ph: [phone], pt's mother did not return pt's call.  CSW facilitated transport via [employer].  Pt left via [facility] (larger vehicle).  Pt did not shoe distress and transitioned into the [place] vehicle without help and took the crutches provided to the pt by the CSW and was appreciative and thanked the CSW.  Please reconsult if future social work needs arise.  CSW signing off, as social work intervention is no longer needed.  Alphonse Guild. Leeanne Butters  MSW, LCSW, LCAS, CCS Transitions of Care Clinical Social Worker Care Coordination Department Ph: [phone]

## 2019-09-04 NOTE — ED Notes (Signed)
Pt R wrist restraint removed.

## 2019-09-04 NOTE — ED Provider Notes (Signed)
Pt seen by Letitia Libra FNP who has cleared her psychiatrically and rescinded IVC.  Pt is requesting d/c home and has no current medical issues preventing her d/c.   Blanchie Dessert, MD 09/04/19 1600

## 2019-09-04 NOTE — ED Notes (Signed)
Pt refused key that was with her chart.

## 2019-09-04 NOTE — ED Notes (Signed)
Pt requesting to use bedpan.  L leg released d/t calm behavior and to allow her to use bedpan.  Bilateral wrists remain restrained d/t erratic and unpredictable behavior.  Pt had a small BM.  Pt educated on behaviors which will lead to release.  Pt continues to be manipulative and staff splitting.    Pt concerned about wrist restraints are too tight.  This Engineer, maintenance (IT) verified 2 fingers easily fit under restraints.
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
## 2019-09-09 ENCOUNTER — Encounter: Payer: Self-pay | Admitting: Physician Assistant

## 2019-09-09 ENCOUNTER — Ambulatory Visit (INDEPENDENT_AMBULATORY_CARE_PROVIDER_SITE_OTHER): Payer: Medicare HMO | Admitting: Physician Assistant

## 2019-09-09 DIAGNOSIS — Z89611 Acquired absence of right leg above knee: Secondary | ICD-10-CM | POA: Diagnosis not present
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
## 2019-09-11 DIAGNOSIS — F4312 Post-traumatic stress disorder, chronic: Secondary | ICD-10-CM | POA: Diagnosis not present

## 2019-09-11 DIAGNOSIS — F603 Borderline personality disorder: Secondary | ICD-10-CM | POA: Diagnosis not present

## 2019-09-11 DIAGNOSIS — F411 Generalized anxiety disorder: Secondary | ICD-10-CM | POA: Diagnosis not present

## 2019-09-11 DIAGNOSIS — F3181 Bipolar II disorder: Secondary | ICD-10-CM | POA: Diagnosis not present

## 2019-09-26 DIAGNOSIS — G4733 Obstructive sleep apnea (adult) (pediatric): Secondary | ICD-10-CM | POA: Diagnosis not present

## 2019-10-13 DIAGNOSIS — F411 Generalized anxiety disorder: Secondary | ICD-10-CM | POA: Diagnosis not present

## 2019-10-13 DIAGNOSIS — F4312 Post-traumatic stress disorder, chronic: Secondary | ICD-10-CM | POA: Diagnosis not present

## 2019-10-13 DIAGNOSIS — F3181 Bipolar II disorder: Secondary | ICD-10-CM | POA: Diagnosis not present

## 2019-10-13 DIAGNOSIS — F603 Borderline personality disorder: Secondary | ICD-10-CM | POA: Diagnosis not present

## 2019-10-14 ENCOUNTER — Other Ambulatory Visit: Payer: Self-pay | Admitting: Family Medicine

## 2019-10-14 DIAGNOSIS — J4520 Mild intermittent asthma, uncomplicated: Secondary | ICD-10-CM

## 2019-10-15 DIAGNOSIS — Z89611 Acquired absence of right leg above knee: Secondary | ICD-10-CM | POA: Diagnosis not present

## 2019-12-18 DIAGNOSIS — F319 Bipolar disorder, unspecified: Secondary | ICD-10-CM | POA: Diagnosis not present

## 2020-01-12 NOTE — Progress Notes (Signed)
Subjective:   Rexann Saniyyah Elster is a 34 y.o. female who presents for an Initial Medicare Annual Wellness Visit.  Attempted virtual visit multiple times and was unable to establish a connection. Visit switched to a telephone visit  I connected with Quita Bruening  today by telephone and verified that I am speaking with the correct person using two identifiers. Location patient: home Location provider: work Persons participating in the virtual visit: patient, provider.   I discussed the limitations, risks, security and privacy concerns of performing an evaluation and management service by telephone and the availability of in person appointments. I also discussed with the patient that there may be a patient responsible charge related to this service. The patient expressed understanding and verbally consented to this telephonic visit.    Interactive audio and video telecommunications were attempted between this provider and patient, however failed, due to patient having technical difficulties OR patient did not have access to video capability.  We continued and completed visit with audio only.      Review of Systems    N/A  Cardiac Risk Factors include: obesity (BMI >30kg/m2)     Objective:    Today's Vitals   There is no height or weight on file to calculate BMI.  Advanced Directives 01/13/2020 09/03/2019 10/01/2018 10/01/2018 09/29/2018 09/28/2018 01/11/2017  Does Patient Have a Medical Advance Directive? No Unable to assess, patient is non-responsive or altered mental status No No No No No  Type of Advance Directive - - - - - - -  Would patient like information on creating a medical advance directive? No - Patient declined - No - Patient declined No - Patient declined No - Guardian declined - No - Patient declined  Pre-existing out of facility DNR order (yellow form or pink MOST form) - - - - - - -  Some encounter information is confidential and restricted. Go to Review Flowsheets  activity to see all data.    Current Medications (verified) Outpatient Encounter Medications as of 01/13/2020  Medication Sig  . ALAYCEN 1/35 tablet Take 1 tablet by mouth daily.  Marland Kitchen albuterol (VENTOLIN HFA) 108 (90 Base) MCG/ACT inhaler INHALE 2 PUFFS INTO THE LUNGS EVERY 6 HOURS AS NEEDED FOR WHEEZE  . Cetirizine HCl 10 MG CAPS Take 1 capsule (10 mg total) by mouth daily for 10 days.  . hydrOXYzine (VISTARIL) 25 MG capsule Take 25 mg by mouth daily as needed for anxiety.  Marland Kitchen EPINEPHrine (EPIPEN 2-PAK) 0.3 mg/0.3 mL IJ SOAJ injection Inject 0.3 mLs (0.3 mg total) into the muscle as needed for anaphylaxis. (Patient not taking: No sig reported)  . ipratropium (ATROVENT) 0.03 % nasal spray Place 2 sprays into both nostrils 2 (two) times daily. (Patient not taking: No sig reported)  . [DISCONTINUED] ibuprofen (ADVIL) 200 MG tablet Take 400 mg by mouth every 6 (six) hours as needed for fever or moderate pain.  . [DISCONTINUED] ibuprofen (ADVIL,MOTRIN) 600 MG tablet Take 1 tablet (600 mg total) by mouth every 6 (six) hours as needed. (Patient not taking: Reported on 09/04/2019)  . [DISCONTINUED] LATUDA 40 MG TABS tablet Take 40 mg by mouth daily.  . [DISCONTINUED] neomycin-polymyxin-pramoxine (NEOSPORIN PLUS) 1 % cream Apply 1 application topically every 4 (four) hours as needed (cuts/scrapes).   No facility-administered encounter medications on file as of 01/13/2020.    Allergies (verified) Almond meal, Latex, and Shellfish allergy   History: Past Medical History:  Diagnosis Date  . Bipolar 1 disorder ([place])   . Depression   .  OSA (obstructive sleep apnea) 08/20/2014  . Suicide attempt ([place])   . Transfusion of blood during current hospitalization    SUMMER 2013  . UTI (lower urinary tract infection) 10/20/11   being treated, has 4 doses abx left   Past Surgical History:  Procedure Laterality Date  . AMPUTATION  06/29/2011   Procedure: AMPUTATION ABOVE KNEE;  Surgeon: Rozanna Box, MD;   Location: [facility];  Service: Orthopedics;  Laterality: Right;  abovve knee amputation of right leg  . AMPUTATION  06/27/2011   Procedure: AMPUTATION ABOVE KNEE;  Surgeon: Rozanna Box, MD;  Location: [facility];  Service: Orthopedics;  Laterality: Right;  . EXTERNAL FIXATION LEG  06/27/2011   Procedure: EXTERNAL FIXATION LEG;  Surgeon: Rozanna Box, MD;  Location: [facility];  Service: Orthopedics;  Laterality: Left;  . EXTERNAL FIXATION REMOVAL  07/14/2011   Procedure: REMOVAL EXTERNAL FIXATION LEG;  Surgeon: Rozanna Box, MD;  Location: [facility];  Service: Orthopedics;  Laterality: Left;  . HARDWARE REMOVAL  10/24/2011   Procedure: HARDWARE REMOVAL;  Surgeon: Rozanna Box, MD;  Location: [facility];  Service: Orthopedics;  Laterality: Left;  left tibial   . HARDWARE REMOVAL Left 04/04/2012   Procedure: HARDWARE REMOVAL;  Surgeon: Rozanna Box, MD;  Location: [facility];  Service: Orthopedics;  Laterality: Left;  . I & D EXTREMITY  06/29/2011   Procedure: IRRIGATION AND DEBRIDEMENT EXTREMITY;  Surgeon: Rozanna Box, MD;  Location: [facility];  Service: Orthopedics;  Laterality: Right;  Irrigation and debridement of right forearm with tendon repair.  Marland Kitchen NOSE SURGERY     BROKEN     . TIBIA IM NAIL INSERTION  07/14/2011   Procedure: INTRAMEDULLARY (IM) NAIL TIBIAL;  Surgeon: Rozanna Box, MD;  Location: [facility];  Service: Orthopedics;  Laterality: Left;  . TIBIA IM NAIL INSERTION  10/24/2011   Procedure: INTRAMEDULLARY (IM) NAIL TIBIAL;  Surgeon: Rozanna Box, MD;  Location: [facility];  Service: Orthopedics;  Laterality: Left;  . TIBIA IM NAIL INSERTION Left 04/04/2012   Procedure: INTRAMEDULLARY (IM) NAIL TIBIAL;  Surgeon: Rozanna Box, MD;  Location: [facility];  Service: Orthopedics;  Laterality: Left;   Family History  Problem Relation Age of Onset  . Allergies Mother   . Asthma Mother   . Allergies Other        siblings  . Asthma Other        siblings   Social History   Socioeconomic History  . Marital  status: Single    Spouse name: Not on file  . Number of children: Not on file  . Years of education: Not on file  . Highest education level: Not on file  Occupational History  . Occupation: Therapist, art  Tobacco Use  . Smoking status: Never Smoker  . Smokeless tobacco: Never Used  Substance and Sexual Activity  . Alcohol use: No    Alcohol/week: 0.0 standard drinks  . Drug use: No  . Sexual activity: Yes    Birth control/protection: None  Other Topics Concern  . Not on file  Social History Narrative   Pt currently living in homeless shelter during the day   Social Determinants of Health   Financial Resource Strain: Low Risk   . Difficulty of Paying Living Expenses: Not hard at all  Food Insecurity: No Food Insecurity  . Worried About Charity fundraiser in the Last Year: Never true  . Ran Out of Food in the Last Year:  Never true  Transportation Needs: No Transportation Needs  . Lack of Transportation (Medical): No  . Lack of Transportation (Non-Medical): No  Physical Activity: Insufficiently Active  . Days of Exercise per Week: 7 days  . Minutes of Exercise per Session: 20 min  Stress: No Stress Concern Present  . Feeling of Stress : Not at all  Social Connections: Moderately Integrated  . Frequency of Communication with Friends and Family: More than three times a week  . Frequency of Social Gatherings with Friends and Family: Twice a week  . Attends Religious Services: More than 4 times per year  . Active Member of Clubs or Organizations: Yes  . Attends Archivist Meetings: More than 4 times per year  . Marital Status: Never married    Tobacco Counseling Counseling given: Not Answered   Clinical Intake:  Pre-visit preparation completed: Yes  Pain : No/denies pain     Nutritional Risks: None  How often do you need to have someone help you when you read instructions, pamphlets, or other written materials from your doctor or pharmacy?: 1 -  Never What is the last grade level you completed in school?: College  Diabetic?No   Interpreter Needed?: No  Information entered by :: [place] of Daily Living In your present state of health, do you have any difficulty performing the following activities: 01/13/2020  Hearing? N  Vision? N  Difficulty concentrating or making decisions? N  Walking or climbing stairs? N  Dressing or bathing? N  Doing errands, shopping? N  Preparing Food and eating ? N  Using the Toilet? N  In the past six months, have you accidently leaked urine? N  Do you have problems with loss of bowel control? N  Managing your Medications? N  Managing your Finances? N  Housekeeping or managing your Housekeeping? N  Some recent data might be hidden    Patient Care Team: Billie Ruddy, MD as PCP - General (Family Medicine)  Indicate any recent Medical Services you may have received from other than [facility] providers in the past year (date may be approximate).     Assessment:   This is a routine wellness examination for Aleisa.  Hearing/Vision screen  Hearing Screening   125Hz  250Hz  500Hz  1000Hz  2000Hz  3000Hz  4000Hz  6000Hz  8000Hz   Right ear:           Left ear:           Vision Screening Comments: Patient states has not had and exam in several years   Dietary issues and exercise activities discussed: Current Exercise Habits: Home exercise routine, Type of exercise: walking, Time (Minutes): 20, Frequency (Times/Week): 7, Weekly Exercise (Minutes/Week): 140, Intensity: Mild  Goals    . Exercise 150 min/wk Moderate Activity    . Weight (lb) < 200 lb (90.7 kg)      Depression Screen PHQ 2/9 Scores 01/13/2020 09/03/2019  PHQ - 2 Score 0 0  PHQ- 9 Score 0 -    Fall Risk Fall Risk  01/13/2020  Falls in the past year? 0  Number falls in past yr: 0  Injury with Fall? 0  Risk for fall due to : No Fall Risks  Follow up Falls evaluation completed;Falls prevention discussed    FALL RISK  PREVENTION PERTAINING TO THE HOME:  Any stairs in or around the home? No  If so, are there any without handrails? No  Home free of loose throw rugs in walkways, pet beds, electrical cords, etc?  Yes  Adequate lighting in your home to reduce risk of falls? Yes   ASSISTIVE DEVICES UTILIZED TO PREVENT FALLS:  Life alert? No  Use of a cane, walker or w/c? No  Grab bars in the bathroom? No  Shower chair or bench in shower? No  Elevated toilet seat or a handicapped toilet? No    Cognitive Function:        Immunizations Immunization History  Administered Date(s) Administered  . Influenza Split 10/26/2011  . Influenza,inj,Quad PF,6+ Mos 10/30/2013  . Influenza-Unspecified 10/26/2018, 09/29/2019  . PFIZER SARS-COV-2 Vaccination 04/14/2019, 05/05/2019  . PPD Test 01/29/2018  . Pneumococcal Polysaccharide-23 10/26/2011    TDAP status: Due, Education has been provided regarding the importance of this vaccine. Advised may receive this vaccine at local pharmacy or Health Dept. Aware to provide a copy of the vaccination record if obtained from local pharmacy or Health Dept. Verbalized acceptance and understanding.  Flu Vaccine status: Up to date  Pneumococcal vaccine status: Up to date  Covid-19 vaccine status: Completed vaccines  Qualifies for Shingles Vaccine? No   Zostavax completed No   Shingrix Completed?: No.    Education has been provided regarding the importance of this vaccine. Patient has been advised to call insurance company to determine out of pocket expense if they have not yet received this vaccine. Advised may also receive vaccine at local pharmacy or Health Dept. Verbalized acceptance and understanding.  Screening Tests Health Maintenance  Topic Date Due  . Hepatitis C Screening  Never done  . HIV Screening  Never done  . TETANUS/TDAP  Never done  . PAP SMEAR-Modifier  Never done  . INFLUENZA VACCINE  Completed  . COVID-19 Vaccine  Completed    Health  Maintenance  Health Maintenance Due  Topic Date Due  . Hepatitis C Screening  Never done  . HIV Screening  Never done  . TETANUS/TDAP  Never done  . PAP SMEAR-Modifier  Never done    Colorectal Cancer Screening: Not due at this age   50 Status: Not due at this age   Bone Density Status: Not due at this age   Lung Cancer Screening: (Low Dose CT Chest recommended if Age 27-80 years, 30 pack-year currently smoking OR have quit w/in 15years.) does not qualify.   Lung Cancer Screening Referral: N/A   Additional Screening:  Hepatitis C Screening: does qualify;   Vision Screening: Recommended annual ophthalmology exams for early detection of glaucoma and other disorders of the eye. Is the patient up to date with their annual eye exam?  No  Who is the provider or what is the name of the office in which the patient attends annual eye exams? Patient does not have an eye doctor  If pt is not established with a provider, would they like to be referred to a provider to establish care? No .   Dental Screening: Recommended annual dental exams for proper oral hygiene  Community Resource Referral / Chronic Care Management: CRR required this visit?  No   CCM required this visit?  No      Plan:     I have personally reviewed and noted the following in the patient's chart:   . Medical and social history . Use of alcohol, tobacco or illicit drugs  . Current medications and supplements . Functional ability and status . Nutritional status . Physical activity . Advanced directives . List of other physicians . Hospitalizations, surgeries, and ER visits in previous 12 months . Vitals .  Screenings to include cognitive, depression, and falls . Referrals and appointments  In addition, I have reviewed and discussed with patient certain preventive protocols, quality metrics, and best practice recommendations. A written personalized care plan for preventive services as well as general  preventive health recommendations were provided to patient.     Ofilia Neas, LPN   [id number]   Nurse Notes: None

## 2020-01-13 ENCOUNTER — Ambulatory Visit (INDEPENDENT_AMBULATORY_CARE_PROVIDER_SITE_OTHER): Payer: Medicare HMO

## 2020-01-13 ENCOUNTER — Other Ambulatory Visit: Payer: Self-pay

## 2020-01-13 DIAGNOSIS — Z0000 Encounter for general adult medical examination without abnormal findings: Secondary | ICD-10-CM | POA: Diagnosis not present

## 2020-01-13 NOTE — Patient Instructions (Signed)
Preventive Care 21-34 Years Old, Female Preventive care refers to visits with your health care provider and lifestyle choices that can promote health and wellness. This includes:  A yearly physical exam. This may also be called an annual well check.  Regular dental visits and eye exams.  Immunizations.  Screening for certain conditions.  Healthy lifestyle choices, such as eating a healthy diet, getting regular exercise, not using drugs or products that contain nicotine and tobacco, and limiting alcohol use. What can I expect for my preventive care visit? Physical exam Your health care provider will check your:  Height and weight. This may be used to calculate body mass index (BMI), which tells if you are at a healthy weight.  Heart rate and blood pressure.  Skin for abnormal spots. Counseling Your health care provider may ask you questions about your:  Alcohol, tobacco, and drug use.  Emotional well-being.  Home and relationship well-being.  Sexual activity.  Eating habits.  Work and work environment.  Method of birth control.  Menstrual cycle.  Pregnancy history. What immunizations do I need?  Influenza (flu) vaccine  This is recommended every year. Tetanus, diphtheria, and pertussis (Tdap) vaccine  You may need a Td booster every 10 years. Varicella (chickenpox) vaccine  You may need this if you have not been vaccinated. Human papillomavirus (HPV) vaccine  If recommended by your health care provider, you may need three doses over 6 months. Measles, mumps, and rubella (MMR) vaccine  You may need at least one dose of MMR. You may also need a second dose. Meningococcal conjugate (MenACWY) vaccine  One dose is recommended if you are age 19-21 years and a first-year college student living in a residence hall, or if you have one of several medical conditions. You may also need additional booster doses. Pneumococcal conjugate (PCV13) vaccine  You may need  this if you have certain conditions and were not previously vaccinated. Pneumococcal polysaccharide (PPSV23) vaccine  You may need one or two doses if you smoke cigarettes or if you have certain conditions. Hepatitis A vaccine  You may need this if you have certain conditions or if you travel or work in places where you may be exposed to hepatitis A. Hepatitis B vaccine  You may need this if you have certain conditions or if you travel or work in places where you may be exposed to hepatitis B. Haemophilus influenzae type b (Hib) vaccine  You may need this if you have certain conditions. You may receive vaccines as individual doses or as more than one vaccine together in one shot (combination vaccines). Talk with your health care provider about the risks and benefits of combination vaccines. What tests do I need?  Blood tests  Lipid and cholesterol levels. These may be checked every 5 years starting at age 20.  Hepatitis C test.  Hepatitis B test. Screening  Diabetes screening. This is done by checking your blood sugar (glucose) after you have not eaten for a while (fasting).  Sexually transmitted disease (STD) testing.  BRCA-related cancer screening. This may be done if you have a family history of breast, ovarian, tubal, or peritoneal cancers.  Pelvic exam and Pap test. This may be done every 3 years starting at age 21. Starting at age 30, this may be done every 5 years if you have a Pap test in combination with an HPV test. Talk with your health care provider about your test results, treatment options, and if necessary, the need for more tests.   Follow these instructions at home: Eating and drinking   Eat a diet that includes fresh fruits and vegetables, whole grains, lean protein, and low-fat dairy.  Take vitamin and mineral supplements as recommended by your health care provider.  Do not drink alcohol if: ? Your health care provider tells you not to drink. ? You are  pregnant, may be pregnant, or are planning to become pregnant.  If you drink alcohol: ? Limit how much you have to 0-1 drink a day. ? Be aware of how much alcohol is in your drink. In the [place] one drink equals one 12 oz bottle of beer (355 mL), one 5 oz glass of wine (148 mL), or one 1 oz glass of hard liquor (44 mL). Lifestyle  Take daily care of your teeth and gums.  Stay active. Exercise for at least 30 minutes on 5 or more days each week.  Do not use any products that contain nicotine or tobacco, such as cigarettes, e-cigarettes, and chewing tobacco. If you need help quitting, ask your health care provider.  If you are sexually active, practice safe sex. Use a condom or other form of birth control (contraception) in order to prevent pregnancy and STIs (sexually transmitted infections). If you plan to become pregnant, see your health care provider for a preconception visit. What's next?  Visit your health care provider once a year for a well check visit.  Ask your health care provider how often you should have your eyes and teeth checked.  Stay up to date on all vaccines. This information is not intended to replace advice given to you by your health care provider. Make sure you discuss any questions you have with your health care provider. Document Revised: 09/27/2017 Document Reviewed: 09/27/2017 [employer]vier Patient Education  2020 [employer].

## 2020-01-14 DIAGNOSIS — F319 Bipolar disorder, unspecified: Secondary | ICD-10-CM | POA: Diagnosis not present

## 2020-01-28 DIAGNOSIS — G4733 Obstructive sleep apnea (adult) (pediatric): Secondary | ICD-10-CM | POA: Diagnosis not present
